# Patient Record
Sex: Female | Born: 1965 | Race: White | Hispanic: No | Marital: Married | Smoking: Never smoker
Health system: Southern US, Community
[De-identification: ages and names within clinical notes are randomized; demographics above are authoritative.]

## PROBLEM LIST (undated history)

## (undated) DIAGNOSIS — D649 Anemia, unspecified: Secondary | ICD-10-CM

## (undated) DIAGNOSIS — K219 Gastro-esophageal reflux disease without esophagitis: Secondary | ICD-10-CM

## (undated) DIAGNOSIS — I10 Essential (primary) hypertension: Secondary | ICD-10-CM

## (undated) DIAGNOSIS — E785 Hyperlipidemia, unspecified: Secondary | ICD-10-CM

## (undated) DIAGNOSIS — F419 Anxiety disorder, unspecified: Secondary | ICD-10-CM

## (undated) DIAGNOSIS — J45909 Unspecified asthma, uncomplicated: Secondary | ICD-10-CM

## (undated) DIAGNOSIS — Z9889 Other specified postprocedural states: Secondary | ICD-10-CM

## (undated) DIAGNOSIS — T7840XA Allergy, unspecified, initial encounter: Secondary | ICD-10-CM

## (undated) DIAGNOSIS — R112 Nausea with vomiting, unspecified: Secondary | ICD-10-CM

## (undated) HISTORY — PX: POLYPECTOMY: SHX149

## (undated) HISTORY — PX: TUBAL LIGATION: SHX77

## (undated) HISTORY — PX: BREAST BIOPSY: SHX20

## (undated) HISTORY — DX: Allergy, unspecified, initial encounter: T78.40XA

## (undated) HISTORY — DX: Hyperlipidemia, unspecified: E78.5

## (undated) HISTORY — PX: BREAST EXCISIONAL BIOPSY: SUR124

## (undated) HISTORY — DX: Anemia, unspecified: D64.9

## (undated) HISTORY — DX: Essential (primary) hypertension: I10

## (undated) HISTORY — DX: Anxiety disorder, unspecified: F41.9

## (undated) HISTORY — DX: Gastro-esophageal reflux disease without esophagitis: K21.9

---

## 2001-10-28 HISTORY — PX: NASAL STENOSIS REPAIR: SHX2071

## 2002-10-28 HISTORY — PX: BREAST SURGERY: SHX581

## 2005-06-25 ENCOUNTER — Ambulatory Visit: Payer: Self-pay | Admitting: Internal Medicine

## 2005-10-01 ENCOUNTER — Ambulatory Visit: Payer: Self-pay | Admitting: Family Medicine

## 2005-12-12 ENCOUNTER — Encounter: Payer: Self-pay | Admitting: Internal Medicine

## 2005-12-25 ENCOUNTER — Ambulatory Visit: Payer: Self-pay | Admitting: Internal Medicine

## 2006-02-20 ENCOUNTER — Ambulatory Visit: Payer: Self-pay | Admitting: Obstetrics & Gynecology

## 2006-02-27 ENCOUNTER — Ambulatory Visit: Payer: Self-pay | Admitting: Internal Medicine

## 2006-03-03 ENCOUNTER — Ambulatory Visit (HOSPITAL_COMMUNITY): Admission: RE | Admit: 2006-03-03 | Discharge: 2006-03-03 | Payer: Self-pay | Admitting: Gynecology

## 2006-03-13 ENCOUNTER — Ambulatory Visit: Payer: Self-pay | Admitting: Obstetrics & Gynecology

## 2006-05-26 ENCOUNTER — Ambulatory Visit: Payer: Self-pay | Admitting: Gynecology

## 2006-07-01 ENCOUNTER — Ambulatory Visit: Payer: Self-pay | Admitting: Internal Medicine

## 2006-07-28 ENCOUNTER — Ambulatory Visit: Payer: Self-pay | Admitting: Gynecology

## 2006-07-28 ENCOUNTER — Encounter (INDEPENDENT_AMBULATORY_CARE_PROVIDER_SITE_OTHER): Payer: Self-pay | Admitting: *Deleted

## 2006-07-28 ENCOUNTER — Ambulatory Visit (HOSPITAL_COMMUNITY): Admission: RE | Admit: 2006-07-28 | Discharge: 2006-07-29 | Payer: Self-pay | Admitting: Obstetrics & Gynecology

## 2006-07-28 HISTORY — PX: ABDOMINAL HYSTERECTOMY: SHX81

## 2006-09-01 ENCOUNTER — Ambulatory Visit: Payer: Self-pay | Admitting: Gynecology

## 2006-12-01 ENCOUNTER — Ambulatory Visit: Payer: Self-pay | Admitting: Family Medicine

## 2007-01-01 ENCOUNTER — Ambulatory Visit: Payer: Self-pay | Admitting: Internal Medicine

## 2007-01-01 LAB — CONVERTED CEMR LAB
ALT: 30 units/L (ref 0–40)
Albumin: 4.1 g/dL (ref 3.5–5.2)
BUN: 8 mg/dL (ref 6–23)
Basophils Absolute: 0 10*3/uL (ref 0.0–0.1)
Basophils Relative: 0.1 % (ref 0.0–1.0)
CO2: 29 meq/L (ref 19–32)
Calcium: 9.3 mg/dL (ref 8.4–10.5)
Chloride: 107 meq/L (ref 96–112)
Cholesterol: 189 mg/dL (ref 0–200)
Creatinine, Ser: 0.7 mg/dL (ref 0.4–1.2)
Eosinophils Absolute: 0.1 10*3/uL (ref 0.0–0.6)
Eosinophils Relative: 2.2 % (ref 0.0–5.0)
GFR calc Af Amer: 119 mL/min
GFR calc non Af Amer: 98 mL/min
Glucose, Bld: 85 mg/dL (ref 70–99)
HCT: 39 % (ref 36.0–46.0)
HDL: 46.8 mg/dL (ref >39.0)
Hemoglobin: 13.4 g/dL (ref 12.0–15.0)
LDL Cholesterol: 121 mg/dL — ABNORMAL HIGH (ref 0–99)
Lymphocytes Relative: 41.3 % (ref 12.0–46.0)
MCHC: 34.3 g/dL (ref 30.0–36.0)
MCV: 82.1 fL (ref 78.0–100.0)
Monocytes Absolute: 0.3 10*3/uL (ref 0.2–0.7)
Monocytes Relative: 5.4 % (ref 3.0–11.0)
Neutro Abs: 3.4 10*3/uL (ref 1.4–7.7)
Neutrophils Relative %: 51 % (ref 43.0–77.0)
Phosphorus: 3.5 mg/dL (ref 2.3–4.6)
Platelets: 211 10*3/uL (ref 150–400)
Potassium: 3.7 meq/L (ref 3.5–5.1)
RBC: 4.75 M/uL (ref 3.87–5.11)
RDW: 13 % (ref 11.5–14.6)
Sodium: 142 meq/L (ref 135–145)
TSH: 0.82 microintl units/mL (ref 0.35–5.50)
Total CHOL/HDL Ratio: 4
Triglycerides: 108 mg/dL (ref 0–149)
VLDL: 22 mg/dL (ref 0–40)
WBC: 6.4 10*3/uL (ref 4.5–10.5)

## 2007-01-14 ENCOUNTER — Emergency Department (HOSPITAL_COMMUNITY): Admission: EM | Admit: 2007-01-14 | Discharge: 2007-01-14 | Payer: Self-pay | Admitting: Emergency Medicine

## 2007-01-15 ENCOUNTER — Encounter: Payer: Self-pay | Admitting: Internal Medicine

## 2007-05-12 ENCOUNTER — Telehealth: Payer: Self-pay | Admitting: Internal Medicine

## 2007-05-13 ENCOUNTER — Ambulatory Visit: Payer: Self-pay | Admitting: Internal Medicine

## 2007-05-13 DIAGNOSIS — H6980 Other specified disorders of Eustachian tube, unspecified ear: Secondary | ICD-10-CM | POA: Insufficient documentation

## 2007-07-17 ENCOUNTER — Ambulatory Visit: Payer: Self-pay | Admitting: Family Medicine

## 2007-07-17 DIAGNOSIS — M6740 Ganglion, unspecified site: Secondary | ICD-10-CM | POA: Insufficient documentation

## 2007-08-12 ENCOUNTER — Encounter: Admission: RE | Admit: 2007-08-12 | Discharge: 2007-08-12 | Payer: Self-pay | Admitting: Obstetrics & Gynecology

## 2007-08-20 ENCOUNTER — Encounter: Admission: RE | Admit: 2007-08-20 | Discharge: 2007-08-20 | Payer: Self-pay | Admitting: Obstetrics & Gynecology

## 2007-09-02 ENCOUNTER — Encounter: Payer: Self-pay | Admitting: Internal Medicine

## 2007-09-10 ENCOUNTER — Ambulatory Visit: Payer: Self-pay | Admitting: Internal Medicine

## 2007-09-10 DIAGNOSIS — K219 Gastro-esophageal reflux disease without esophagitis: Secondary | ICD-10-CM | POA: Insufficient documentation

## 2007-09-10 DIAGNOSIS — E781 Pure hyperglyceridemia: Secondary | ICD-10-CM | POA: Insufficient documentation

## 2007-09-10 DIAGNOSIS — F419 Anxiety disorder, unspecified: Secondary | ICD-10-CM | POA: Insufficient documentation

## 2007-09-10 DIAGNOSIS — F411 Generalized anxiety disorder: Secondary | ICD-10-CM | POA: Insufficient documentation

## 2007-09-10 DIAGNOSIS — I10 Essential (primary) hypertension: Secondary | ICD-10-CM | POA: Insufficient documentation

## 2007-09-11 ENCOUNTER — Telehealth (INDEPENDENT_AMBULATORY_CARE_PROVIDER_SITE_OTHER): Payer: Self-pay | Admitting: *Deleted

## 2007-09-11 ENCOUNTER — Encounter (INDEPENDENT_AMBULATORY_CARE_PROVIDER_SITE_OTHER): Payer: Self-pay | Admitting: *Deleted

## 2007-11-19 ENCOUNTER — Ambulatory Visit: Payer: Self-pay | Admitting: Internal Medicine

## 2007-11-19 DIAGNOSIS — R002 Palpitations: Secondary | ICD-10-CM | POA: Insufficient documentation

## 2007-11-25 ENCOUNTER — Telehealth: Payer: Self-pay | Admitting: Internal Medicine

## 2008-01-12 ENCOUNTER — Telehealth: Payer: Self-pay | Admitting: Internal Medicine

## 2008-02-29 ENCOUNTER — Ambulatory Visit: Payer: Self-pay | Admitting: Family Medicine

## 2008-02-29 LAB — CONVERTED CEMR LAB: Rapid Strep: NEGATIVE

## 2008-03-07 ENCOUNTER — Telehealth (INDEPENDENT_AMBULATORY_CARE_PROVIDER_SITE_OTHER): Payer: Self-pay | Admitting: *Deleted

## 2008-03-08 ENCOUNTER — Ambulatory Visit: Payer: Self-pay | Admitting: Internal Medicine

## 2008-03-08 DIAGNOSIS — F438 Other reactions to severe stress: Secondary | ICD-10-CM | POA: Insufficient documentation

## 2008-03-08 DIAGNOSIS — F4389 Other reactions to severe stress: Secondary | ICD-10-CM | POA: Insufficient documentation

## 2008-05-07 ENCOUNTER — Ambulatory Visit: Payer: Self-pay | Admitting: Internal Medicine

## 2008-05-07 LAB — CONVERTED CEMR LAB: Rapid Strep: NEGATIVE

## 2008-08-10 ENCOUNTER — Telehealth (INDEPENDENT_AMBULATORY_CARE_PROVIDER_SITE_OTHER): Payer: Self-pay | Admitting: *Deleted

## 2008-08-15 ENCOUNTER — Ambulatory Visit (HOSPITAL_COMMUNITY): Admission: RE | Admit: 2008-08-15 | Discharge: 2008-08-15 | Payer: Self-pay | Admitting: Gynecology

## 2008-08-15 LAB — HM MAMMOGRAPHY

## 2008-09-14 ENCOUNTER — Ambulatory Visit: Payer: Self-pay | Admitting: Internal Medicine

## 2008-09-16 LAB — CONVERTED CEMR LAB
ALT: 48 units/L — ABNORMAL HIGH (ref 0–35)
AST: 29 units/L (ref 0–37)
Albumin: 4.1 g/dL (ref 3.5–5.2)
Alkaline Phosphatase: 49 units/L (ref 39–117)
BUN: 8 mg/dL (ref 6–23)
Basophils Absolute: 0 10*3/uL (ref 0.0–0.1)
Basophils Relative: 0.6 % (ref 0.0–3.0)
Bilirubin, Direct: 0.1 mg/dL (ref 0.0–0.3)
CO2: 29 meq/L (ref 19–32)
Calcium: 9.2 mg/dL (ref 8.4–10.5)
Chloride: 105 meq/L (ref 96–112)
Cholesterol: 206 mg/dL (ref 0–200)
Creatinine, Ser: 0.7 mg/dL (ref 0.4–1.2)
Direct LDL: 128.7 mg/dL
Eosinophils Absolute: 0.2 10*3/uL (ref 0.0–0.7)
Eosinophils Relative: 2.8 % (ref 0.0–5.0)
GFR calc Af Amer: 118 mL/min
GFR calc non Af Amer: 98 mL/min
Glucose, Bld: 86 mg/dL (ref 70–99)
HCT: 40.2 % (ref 36.0–46.0)
HDL: 40.8 mg/dL (ref >39.0)
Hemoglobin: 13.8 g/dL (ref 12.0–15.0)
Lymphocytes Relative: 24.4 % (ref 12.0–46.0)
MCHC: 34.4 g/dL (ref 30.0–36.0)
MCV: 82 fL (ref 78.0–100.0)
Monocytes Absolute: 0.4 10*3/uL (ref 0.1–1.0)
Monocytes Relative: 5.3 % (ref 3.0–12.0)
Neutro Abs: 5.1 10*3/uL (ref 1.4–7.7)
Neutrophils Relative %: 66.9 % (ref 43.0–77.0)
Phosphorus: 3.4 mg/dL (ref 2.3–4.6)
Platelets: 171 10*3/uL (ref 150–400)
Potassium: 3.9 meq/L (ref 3.5–5.1)
RBC: 4.9 M/uL (ref 3.87–5.11)
RDW: 12.7 % (ref 11.5–14.6)
Sodium: 139 meq/L (ref 135–145)
TSH: 1.41 microintl units/mL (ref 0.35–5.50)
Total Bilirubin: 0.7 mg/dL (ref 0.3–1.2)
Total CHOL/HDL Ratio: 5
Total Protein: 7.3 g/dL (ref 6.0–8.3)
Triglycerides: 143 mg/dL (ref 0–149)
VLDL: 29 mg/dL (ref 0–40)
WBC: 7.6 10*3/uL (ref 4.5–10.5)

## 2008-09-27 ENCOUNTER — Ambulatory Visit: Payer: Self-pay | Admitting: Family Medicine

## 2008-10-28 HISTORY — PX: COLONOSCOPY: SHX174

## 2008-11-02 ENCOUNTER — Ambulatory Visit: Payer: Self-pay | Admitting: Family Medicine

## 2008-11-02 DIAGNOSIS — R059 Cough, unspecified: Secondary | ICD-10-CM | POA: Insufficient documentation

## 2008-11-02 DIAGNOSIS — R05 Cough: Secondary | ICD-10-CM

## 2008-12-08 ENCOUNTER — Encounter (INDEPENDENT_AMBULATORY_CARE_PROVIDER_SITE_OTHER): Payer: Self-pay | Admitting: *Deleted

## 2008-12-21 ENCOUNTER — Telehealth: Payer: Self-pay | Admitting: Internal Medicine

## 2009-01-26 HISTORY — PX: CARPAL TUNNEL RELEASE: SHX101

## 2009-03-22 ENCOUNTER — Ambulatory Visit: Payer: Self-pay | Admitting: Internal Medicine

## 2009-03-23 LAB — CONVERTED CEMR LAB
ALT: 38 units/L — ABNORMAL HIGH (ref 0–35)
AST: 29 units/L (ref 0–37)
Albumin: 4.1 g/dL (ref 3.5–5.2)
Alkaline Phosphatase: 53 units/L (ref 39–117)
BUN: 12 mg/dL (ref 6–23)
Basophils Absolute: 0 10*3/uL (ref 0.0–0.1)
Basophils Relative: 0.4 % (ref 0.0–3.0)
Bilirubin, Direct: 0 mg/dL (ref 0.0–0.3)
CO2: 29 meq/L (ref 19–32)
Calcium: 9.1 mg/dL (ref 8.4–10.5)
Chloride: 108 meq/L (ref 96–112)
Cholesterol: 183 mg/dL (ref 0–200)
Creatinine, Ser: 0.7 mg/dL (ref 0.4–1.2)
Eosinophils Absolute: 0.2 10*3/uL (ref 0.0–0.7)
Eosinophils Relative: 2.3 % (ref 0.0–5.0)
Glucose, Bld: 93 mg/dL (ref 70–99)
HCT: 39 % (ref 36.0–46.0)
HDL: 40.4 mg/dL (ref >39.00)
Hemoglobin: 13.7 g/dL (ref 12.0–15.0)
LDL Cholesterol: 116 mg/dL — ABNORMAL HIGH (ref 0–99)
Lymphocytes Relative: 33.2 % (ref 12.0–46.0)
Lymphs Abs: 2.5 10*3/uL (ref 0.7–4.0)
MCHC: 35.1 g/dL (ref 30.0–36.0)
MCV: 81.3 fL (ref 78.0–100.0)
Monocytes Absolute: 0.4 10*3/uL (ref 0.1–1.0)
Monocytes Relative: 4.8 % (ref 3.0–12.0)
Neutro Abs: 4.4 10*3/uL (ref 1.4–7.7)
Neutrophils Relative %: 59.3 % (ref 43.0–77.0)
Phosphorus: 3.6 mg/dL (ref 2.3–4.6)
Platelets: 183 10*3/uL (ref 150.0–400.0)
Potassium: 3.8 meq/L (ref 3.5–5.1)
RBC: 4.79 M/uL (ref 3.87–5.11)
RDW: 12.9 % (ref 11.5–14.6)
Sodium: 140 meq/L (ref 135–145)
TSH: 2.03 microintl units/mL (ref 0.35–5.50)
Total Bilirubin: 0.6 mg/dL (ref 0.3–1.2)
Total CHOL/HDL Ratio: 5
Total Protein: 7.2 g/dL (ref 6.0–8.3)
Triglycerides: 135 mg/dL (ref 0.0–149.0)
VLDL: 27 mg/dL (ref 0.0–40.0)
WBC: 7.5 10*3/uL (ref 4.5–10.5)

## 2009-04-24 ENCOUNTER — Encounter: Payer: Self-pay | Admitting: Internal Medicine

## 2009-06-27 ENCOUNTER — Ambulatory Visit: Payer: Self-pay | Admitting: Family Medicine

## 2009-06-27 DIAGNOSIS — IMO0002 Reserved for concepts with insufficient information to code with codable children: Secondary | ICD-10-CM | POA: Insufficient documentation

## 2009-09-13 ENCOUNTER — Ambulatory Visit (HOSPITAL_COMMUNITY): Admission: RE | Admit: 2009-09-13 | Discharge: 2009-09-13 | Payer: Self-pay | Admitting: Obstetrics & Gynecology

## 2009-09-26 ENCOUNTER — Ambulatory Visit: Payer: Self-pay | Admitting: Internal Medicine

## 2009-09-28 LAB — CONVERTED CEMR LAB
ALT: 63 units/L — ABNORMAL HIGH (ref 0–35)
AST: 46 units/L — ABNORMAL HIGH (ref 0–37)
Albumin: 4 g/dL (ref 3.5–5.2)
Alkaline Phosphatase: 64 units/L (ref 39–117)
BUN: 5 mg/dL — ABNORMAL LOW (ref 6–23)
Basophils Absolute: 0 10*3/uL (ref 0.0–0.1)
Basophils Relative: 0.5 % (ref 0.0–3.0)
Bilirubin, Direct: 0.2 mg/dL (ref 0.0–0.3)
CO2: 29 meq/L (ref 19–32)
Calcium: 9.1 mg/dL (ref 8.4–10.5)
Chloride: 105 meq/L (ref 96–112)
Cholesterol: 176 mg/dL (ref 0–200)
Creatinine, Ser: 0.6 mg/dL (ref 0.4–1.2)
Eosinophils Absolute: 0.1 10*3/uL (ref 0.0–0.7)
Eosinophils Relative: 2.1 % (ref 0.0–5.0)
GFR calc non Af Amer: 115.48 mL/min (ref >60)
Glucose, Bld: 93 mg/dL (ref 70–99)
HCT: 41 % (ref 36.0–46.0)
HDL: 32.3 mg/dL — ABNORMAL LOW (ref >39.00)
Hemoglobin: 13.7 g/dL (ref 12.0–15.0)
LDL Cholesterol: 114 mg/dL — ABNORMAL HIGH (ref 0–99)
Lymphocytes Relative: 33.3 % (ref 12.0–46.0)
Lymphs Abs: 2.2 10*3/uL (ref 0.7–4.0)
MCHC: 33.4 g/dL (ref 30.0–36.0)
MCV: 84.4 fL (ref 78.0–100.0)
Monocytes Absolute: 0.5 10*3/uL (ref 0.1–1.0)
Monocytes Relative: 7.3 % (ref 3.0–12.0)
Neutro Abs: 3.8 10*3/uL (ref 1.4–7.7)
Neutrophils Relative %: 56.8 % (ref 43.0–77.0)
Phosphorus: 3.1 mg/dL (ref 2.3–4.6)
Platelets: 177 10*3/uL (ref 150.0–400.0)
Potassium: 4.1 meq/L (ref 3.5–5.1)
RBC: 4.85 M/uL (ref 3.87–5.11)
RDW: 12.5 % (ref 11.5–14.6)
Sodium: 140 meq/L (ref 135–145)
Total Bilirubin: 0.8 mg/dL (ref 0.3–1.2)
Total CHOL/HDL Ratio: 5
Total Protein: 7.1 g/dL (ref 6.0–8.3)
Triglycerides: 147 mg/dL (ref 0.0–149.0)
VLDL: 29.4 mg/dL (ref 0.0–40.0)
WBC: 6.6 10*3/uL (ref 4.5–10.5)

## 2009-10-08 ENCOUNTER — Emergency Department (HOSPITAL_COMMUNITY): Admission: EM | Admit: 2009-10-08 | Discharge: 2009-10-08 | Payer: Self-pay | Admitting: Family Medicine

## 2009-10-08 ENCOUNTER — Telehealth: Payer: Self-pay | Admitting: Family Medicine

## 2010-06-27 ENCOUNTER — Telehealth: Payer: Self-pay | Admitting: Internal Medicine

## 2010-08-23 LAB — HM COLONOSCOPY

## 2010-08-29 ENCOUNTER — Encounter: Payer: Self-pay | Admitting: Internal Medicine

## 2010-10-10 ENCOUNTER — Ambulatory Visit: Payer: Self-pay | Admitting: Internal Medicine

## 2010-10-10 ENCOUNTER — Encounter (INDEPENDENT_AMBULATORY_CARE_PROVIDER_SITE_OTHER): Payer: Self-pay | Admitting: *Deleted

## 2010-10-11 ENCOUNTER — Telehealth: Payer: Self-pay | Admitting: Family Medicine

## 2010-10-11 ENCOUNTER — Encounter: Payer: Self-pay | Admitting: Family Medicine

## 2010-10-11 ENCOUNTER — Encounter (INDEPENDENT_AMBULATORY_CARE_PROVIDER_SITE_OTHER): Payer: Self-pay | Admitting: *Deleted

## 2010-10-12 ENCOUNTER — Telehealth: Payer: Self-pay | Admitting: Family Medicine

## 2010-10-16 ENCOUNTER — Telehealth: Payer: Self-pay | Admitting: Internal Medicine

## 2010-11-18 ENCOUNTER — Encounter: Payer: Self-pay | Admitting: Obstetrics & Gynecology

## 2010-11-27 NOTE — Consult Note (Signed)
Summary: [facility]  [facility]   Imported By: Lanelle Bal 09/10/2010 11:43:00  _____________________________________________________________________  External Attachment:    Type:   Image     Comment:   External Document  Appended Document: [facility]     Clinical Lists Changes  Observations: Added new observation of EGD: Barrett's esophagus Plan follow up in 6 months (08/23/2010 14:15) Added new observation of COLONOSCOPY: No polyps recall in 5 years due to history of polyps (08/23/2010 14:14)       Colonoscopy  Procedure date:  08/23/2010  Findings:      No polyps recall in 5 years due to history of polyps  EGD  Procedure date:  08/23/2010  Findings:      Barrett's esophagus Plan follow up in 6 months
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
## 2010-11-27 NOTE — Assessment & Plan Note (Signed)
Summary: LUMP ON HAND/CLE    Vital Signs:  Patient Profile:   45 Years Old Female Weight:      169 pounds Temp:     98.3 degrees F oral Pulse rate:   81 / minute BP sitting:   158 / 87  (left arm) Cuff size:   regular  Vitals Entered By: Cooper Render (July 17, 2007 9:19 AM)                 Chief Complaint:  lump ojn L) hand and hurts.  History of Present Illness: tender area on left hand, feels lump on palm below 2 and 3rd digit. No redness, slight swelling tender to grip left hand, and open No hx of similar.   Current Allergies (reviewed today): ! LIPITOR (ATORVASTATIN CALCIUM) ! * SHELL FISH * PENICILLINS GROUP CEFZIL (CEFPROZIL)     Review of Systems  The patient denies fever and weight loss.     Physical Exam  General:     Well-developed,well-nourished,in no acute distress; alert,appropriate and cooperative throughout examination Msk:     no triggering of fingers, no DIP,PIP, MCP joint synovitis or tenderness 1 cm palpable mass mobile with extension and flexion of 3rd digit, appears connected to flexor tendon Pulses:     R and L carotid,radial,femoral,dorsalis pedis and posterior tibial pulses are full and equal bilaterally Neurologic:     No cranial nerve deficits noted. Station and gait are normal. Sensory, motor and coordinative functions appear intact.    Impression & Recommendations:  Problem # 1:  GANGLION, TENDON SHEATH (ICD-727.42) left 3rd flexor tendon sheath.  Ice, NSAIDs, gentle strtching.  Referral to hand surgeon if no improvement.  Complete Medication List: 1)  Nexium 40 Mg Cpdr (Esomeprazole magnesium) .... Take 1 tablet by mouth once a day prn 2)  Lescol Xl 80 Mg Tb24 (Fluvastatin sodium) .... Take 1 tablet by mouth once a day 3)  Toprol Xl 50 Mg Tb24 (Metoprolol succinate) .... Take 1 tablet by mouth once a day   Patient Instructions: 1)  Ibuprofen 800 m every 8 hours. 2)  Ice, gentle stretching. 3)  Callif not  improving in 10-14 days.    ]

## 2010-11-27 NOTE — Assessment & Plan Note (Signed)
Summary: SORE THROAT AND EAR PAIN/CDJ   Vital Signs:  Patient Profile:   45 Years Old Female Temp:     97.5 degrees F oral Pulse rate:   79 / minute BP sitting:   139 / 88  (left arm)  Vitals Entered By: Doristine Devoid (May 07, 2008 12:00 PM)                 PCP:  Alphonsus Sias  Chief Complaint:  left ear pain and sore throat along with headaches.  History of Present Illness: Yvonne Stephenson comes to saturday clinic for ST  HA and congestion and hoarseness and now left ear pain increasing ove 1 day   hurts to swallow .  ? no fever.  Had recent dental work  on r side of mouth.  NO strep exposure.   Gets sinus infections.  No tobacco or asthma .     Allergic to many antibiotic s  can take biaxin but not pcn azithro  cefzil.        Current Allergies: ! LIPITOR (ATORVASTATIN CALCIUM) ! * SHELL FISH ! * ZPAK * PENICILLINS GROUP CEFZIL  Past Medical History:    Anxiety    GERD    Hyperlipidemia    Hypertension          Review of Systems  The patient denies anorexia, fever, chest pain, syncope, dyspnea on exertion, prolonged cough, and hemoptysis.     Physical Exam  General:     alert, well-developed, and well-nourished.  hoarse and congested and uncomfortable Head:     normocephalic and atraumatic.   Eyes:     no redness or discharge Ears:     left tm fluid  no redness eac ok Nose:     very congested  Mouth:     2+ red no exudate or ulcers  + cobblestoning Neck:     supple and full ROM.  tender adenopathy r  Lungs:     Normal respiratory effort, chest expands symmetrically. Lungs are clear to auscultation, no crackles or wheezes.no dullness.   Heart:     Normal rate and regular rhythm. S1 and S2 normal without gallop, murmur, click, rub or other extra sounds. Skin:     turgor normal, color normal, and no ecchymoses.   Cervical Nodes:     L anterior LN tender.  1-2+  rest shoddy Psych:     Oriented X3 and normally interactive.      Impression &  Recommendations:  Problem # 1:  EAR PAIN, LEFT (ICD-388.70) ? serous vs acute om   with eustachian tube dysfunction and poss referred pain  from LN swelling also  ibuprofen for pain as needed  Her updated medication list for this problem includes:    Biaxin Xl 500 Mg Tb24 (Clarithromycin) .[name]... 2 tabs daily for 5 days  Orders: Rapid Strep (63016)   Problem # 2:  RESPIRATORY DISORDER, ACUTE (ICD-465.9) see above   symptom rx    Orders: Rapid Strep (01093)   Complete Medication List: 1)  Lescol Xl 80 Mg Tb24 (Fluvastatin sodium) .... Take 1 tablet by mouth once a day 2)  Toprol Xl 50 Mg Tb24 (Metoprolol succinate) .... Take 1 tablet by mouth once a day 3)  Prilosec Otc 20 Mg Tbec (Omeprazole magnesium) .... Take one by mouth once a day as needed 4)  Theraflu  .... As needed 5)  Flonase 50 Mcg/act Susp (Fluticasone propionate) .... 2 sprays in each nostril once a day. 6)  Bisoprolol Fumarate 5 Mg Tabs (Bisoprolol fumarate) .... Take 1 tablet by mouth once a day 7)  Biaxin Xl 500 Mg Tb24 (Clarithromycin) .... 2 tabs daily for 5 days    Prescriptions: BIAXIN XL 500 MG  TB24 (CLARITHROMYCIN) 2 tabs daily for 5 days  #10 x 0   Entered and Authorized by:   Madelin Headings MD   Signed by:   Madelin Headings MD on 05/07/2008   Method used:   [facility] ...       [facility]  [address] [phone][facility]       [address]       [place], [place]  [address]       Ph: [phone]       Fax: [phone]   RxID:   [id number]  ] Laboratory Results    Other Tests  Rapid Strep: negative

## 2010-11-27 NOTE — Progress Notes (Signed)
  Phone Note Call from Patient   Caller: Patient Summary of Call: Started to have a stye on thursday and has been using warm compresses. Woke up this morning and now eye is swollen up to her eyebrow and can barely open eye. Rec. eval this morning at [facility] to eval for preorbital vs orbital cellulitis and she agreed. Initial call taken by: Hannah Beat MD,  October 08, 2009 8:13 AM

## 2010-11-27 NOTE — Letter (Signed)
Summary: [facility]  [facility]   Imported By: Lanelle Bal 05/12/2009 11:56:18  _____________________________________________________________________  External Attachment:    Type:   Image     Comment:   External Document  Appended Document: [facility]     Clinical Lists Changes  Observations: Added new observation of COLONOSCOPY: 1 polyp recall 1 year  Dr Noe Gens in [facility] (04/17/2009 13:52)       Colonoscopy  Procedure date:  04/17/2009  Findings:      1 polyp recall 1 year  Dr Noe Gens in [facility]  Appended Document: [facility] EGD done also--esophageal ulcer plans repeat in 1 month and 1 year

## 2010-11-27 NOTE — Miscellaneous (Signed)
Summary: Orders Update  Medications Added DOXY-CAPS 100 MG  CAPS (DOXYCYCLINE HYCLATE) Take 1 tab twice a day x 10 days.       Clinical Lists Changes  Medications: Removed medication of ZITHROMAX Z-PAK 250 MG  TABS (AZITHROMYCIN) Take 2 tabs today and then starting tomorrow, take 1 tab every day x 4. Added new medication of DOXY-CAPS 100 MG  CAPS (DOXYCYCLINE HYCLATE) Take 1 tab twice a day x 10 days. - Signed Rx of DOXY-CAPS 100 MG  CAPS (DOXYCYCLINE HYCLATE) Take 1 tab twice a day x 10 days.;  #20 x 0;  Signed;  Entered by: Kathie Rhodes NP;  Authorized by: Kathie Rhodes NP;  Method used: Print then Give to Patient    Prescriptions: DOXY-CAPS 100 MG  CAPS (DOXYCYCLINE HYCLATE) Take 1 tab twice a day x 10 days.  #20 x 0   Entered and Authorized by:   Kathie Rhodes NP   Signed by:   Kathie Rhodes NP on 09/11/2007   Method used:   Print then Give to Patient   RxID:   [id number]

## 2010-11-27 NOTE — Assessment & Plan Note (Signed)
Summary: COUGH/CLE   Vital Signs:  Patient Profile:   45 Years Old Female Weight:      186 pounds O2 Sat:      97 % O2 treatment:    Room Air Temp:     98.4 degrees F oral Pulse rate:   70 / minute Pulse rhythm:   regular BP sitting:   118 / 72  (left arm) Cuff size:   regular  Vitals Entered By: Mervin Hack CMA (November 02, 2008 11:53 AM)                 Chief Complaint:  Cough.  Acute Visit History:      The patient complains of cough.  These symptoms began cough x 2 months, worse in last 4 days ago.  She denies earache, fever, headache, nasal discharge, sinus problems, and sore throat.  Other comments include: chest tight tried tessalon perles and albuterol helped a little but not much.        The patient notes wheezing and shortness of breath.  The character of the cough is described as nonproductive.         Hypertension History:      Positive major cardiovascular risk factors include hyperlipidemia and hypertension.  Negative major cardiovascular risk factors include female age less than 59 years old and non-tobacco-user status.        Current Allergies (reviewed today): ! LIPITOR (ATORVASTATIN CALCIUM) ! * SHELL FISH * PENICILLINS GROUP CEFZIL AZITHROMYCIN (AZITHROMYCIN)  Past Medical History:    Reviewed history from 05/07/2008 and no changes required:       Anxiety       GERD       Hyperlipidemia       Hypertension           Social History:    Reviewed history from 09/14/2008 and no changes required:       Occupation:Homemaker       3 children, 15,13 & 7       Married       Gardens       Does walk a little for exercise       Never Smoked       Alcohol use-occ    Review of Systems       no heartburn, no allergy symptoms.   General      Denies fever.  ENT      Denies nasal congestion, postnasal drainage, and sinus pressure.  GI      Denies abdominal pain.  GU      Denies discharge.  Neuro      no dysphagia   Physical Exam   General:     Well-developed,well-nourished,in no acute distress; alert,appropriate and cooperative throughout examination Ears:     External ear exam shows no significant lesions or deformities.  Otoscopic examination reveals clear canals, tympanic membranes are intact bilaterally without bulging, retraction, inflammation or discharge. Hearing is grossly normal bilaterally. Nose:     External nasal examination shows no deformity or inflammation. Nasal mucosa are pink and moist without lesions or exudates. Mouth:     Oral mucosa and oropharynx without lesions or exudates.  Teeth in good repair. Neck:     no cervical or supraclavicular lymphadenopathy  Lungs:     Normal respiratory effort, chest expands symmetrically. Lungs are clear to auscultation, no crackles or wheezes. Heart:     Normal rate and regular rhythm. S1 and S2 normal without gallop, murmur, click, rub  or other extra sounds.    Impression & Recommendations:  Problem # 1:  COUGH (ICD-786.2) No sign of infection or other abnormlity of CXR.  Given recent viral infection, i feel post inflammatory bronchospasm is most likely. No s/s of GERD or allergies at this point. Will treat with inhaler and prednisone taper. Call if fever or not improving. No antibiotic needed. Able to still use tessalon perles.  Orders: CXR- 2view (CXR)   Complete Medication List: 1)  Lescol Xl 80 Mg Tb24 (Fluvastatin sodium) .... Take 1 tablet by mouth once a day 2)  Toprol Xl 50 Mg Tb24 (Metoprolol succinate) .... Take 1 tablet by mouth once a day 3)  Prilosec Otc 20 Mg Tbec (Omeprazole magnesium) .... Take one by mouth once a day as needed 4)  Flonase 50 Mcg/act Susp (Fluticasone propionate) .... 2 sprays in each nostril once a day. 5)  Bisoprolol Fumarate 5 Mg Tabs (Bisoprolol fumarate) .... Take 1 tablet by mouth once a day 6)  Multi-symptom Cold Relief 30-2-10-325 Mg Tabs (Pseudoeph-cpm-dm-apap) .... Take 1-2 by mouth every 6 hours as needed 7)   Hydrocodone-homatropine 5-1.5 Mg/58ml Syrp (Hydrocodone-homatropine) .[name].. 1-2 teaspoons at bedtime as needed for severe cough 8)  Proventil Hfa 108 (90 Base) Mcg/act Aers (Albuterol sulfate) .... 2 puffs up to every 4 hours as needed wheezing/ tight chest 9)  Tessalon 200 Mg Caps (Benzonatate) .[name].. 1 by mouth up to three times a day as needed cough (swallow whole) 10)  Prednisone 20 Mg Tabs (Prednisone) .... 3 tabs by mouth daily x 3 days, then 2 tabs by mouth daily x 2 days then 1 tab by mouth daily x 2 days  Hypertension Assessment/Plan:      The patient's hypertensive risk group is category B: At least one risk factor (excluding diabetes) with no target organ damage.  Her calculated 10 year risk of coronary heart disease is 2 %.  Today's blood pressure is 118/72.  Her blood pressure goal is < 140/90.    Prescriptions: PREDNISONE 20 MG TABS (PREDNISONE) 3 tabs by mouth daily x 3 days, then 2 tabs by mouth daily x 2 days then 1 tab by mouth daily x 2 days  #15 x 0   Entered and Authorized by:   Kerby Nora MD   Signed by:   Kerby Nora MD on 11/02/2008   Method used:   [facility]        [facility]  [address] [facility]ail)       [address]       [place], [place]  [address]       Ph: [phone] or [phone]       Fax: [phone]   RxID:   [id number]  ] Current Allergies (reviewed today): ! LIPITOR (ATORVASTATIN CALCIUM) ! * SHELL FISH * PENICILLINS GROUP CEFZIL AZITHROMYCIN (AZITHROMYCIN)

## 2010-11-27 NOTE — Progress Notes (Signed)
Summary: wants to change from lescol  Phone Note From Pharmacy   [facility]  [id number] Summary of Call: Pharmacy is asking to change lescol to something less costly.  This will cost pt $127. and pt is also asking to change. Initial call taken by: Lowella Petties CMA,  June 27, 2010 12:22 PM  Follow-up for Phone Call        okay to have her try pravastatin 40mg  daily send 1 year Rx and set her up for lipid and hepatic profiles in about 4-6 weeks Follow-up by: Cindee Salt MD,  June 27, 2010 1:58 PM  Additional Follow-up for Phone Call Additional follow up Details #1::        called both cell and home number, work number was busy twice(it's [employer]) left message that [facility] and that pt needs to schedule labs 4-6 weeks after starting med. DeShannon Smith CMA (AAMA)  June 27, 2010 3:24 PM     New/Updated Medications: PRAVASTATIN SODIUM 40 MG TABS (PRAVASTATIN SODIUM) take 1 by mouth once daily Prescriptions: PRAVASTATIN SODIUM 40 MG TABS (PRAVASTATIN SODIUM) take 1 by mouth once daily  #30 x 12   Entered by:   Mervin Hack CMA (AAMA)   Authorized by:   Cindee Salt MD   Signed by:   Mervin Hack CMA (AAMA) on 06/27/2010   Method used:   [facility]        [facility]  [address] (retail)       [address]       [place], [place]  [address]       Ph: [phone] or [phone]       Fax: [phone]   RxID:   [id number]
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
## 2010-11-27 NOTE — Progress Notes (Signed)
Summary: rx bisporolol  Medications Added BISOPROLOL FUMARATE 5 MG  TABS (BISOPROLOL FUMARATE) Take 1 tablet by mouth once a day       [facility] Note [facility]   [facility] to    Method Requested: Fax to Local Pharmacy Initial call taken by: Wandra Mannan,  January 12, 2008 10:37 AM  Follow-up for Phone Call        change to bisoprolol 5mg  daily Okay 1 year supply Follow-up by: Cindee Salt MD,  January 12, 2008 1:15 PM    New/Updated Medications: BISOPROLOL FUMARATE 5 MG  TABS (BISOPROLOL FUMARATE) Take 1 tablet by mouth once a day   Prescriptions: BISOPROLOL FUMARATE 5 MG  TABS (BISOPROLOL FUMARATE) Take 1 tablet by mouth once a day  #30 x 12   Entered by:   Wandra Mannan   Authorized by:   Cindee Salt MD   Signed by:   Wandra Mannan on 01/12/2008   Method used:   Elect[facility] ...       [facility]  [address]  [id number]       [address]       [place], [place]  [address]       Ph: [phone] or [phone]       Fax: [phone]   RxID:   [id number]
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
## 2010-11-27 NOTE — Progress Notes (Signed)
Summary: fyi      Allergies Added: * PENICILLINS GROUP CEFZIL (CEFPROZIL) Phone Note Call from Patient Call back at [phone]   Caller: sister- laura Call For: Selma Rodelo Summary of Call: pt is at dentist right now she is c/o a bad earache wants appt today Initial call taken by: Liane Comber,  May 12, 2007 3:52 PM  Follow-up for Phone Call        Ii spoke with sister pt is still at dentist, I advised urgent care tonight or if she would rather come here, 8:15 tomorrow with you. she will tome tomorrow am. ..................................................................Marland KitchenLiane Comber  May 12, 2007 4:03 PM   Additional Follow-up for Phone Call Additional follow up Details #1::        okay Additional Follow-up by: Cindee Salt MD,  May 13, 2007 7:55 AM   New Allergies: * PENICILLINS GROUP CEFZIL (CEFPROZIL) New Allergies: * PENICILLINS GROUP CEFZIL (CEFPROZIL)

## 2010-11-27 NOTE — Letter (Signed)
Summary: Primary Care Appointment Letter  [facility]  [address] [place], [place] [address]   Phone: [phone]  Fax: [phone]    12/08/2008 MRN: [id number]  ITZEL MCKIBBIN [address] [place], [place]  [address]  Dear Ms. Carma Leaven,   Your Primary Care Physician Cindee Salt MD has indicated that:    _______it is time to schedule an appointment.    _______you missed your appointment on______ and need to call and          reschedule.    _______you need to have lab work done.    _______you need to schedule an appointment discuss lab or test results.    _______you need to call to reschedule your appointment that is                       scheduled on _________.     Please call our office as soon as possible. Our phone number is [phone]          _________. Please press option 1. Our office is open 8a-12noon and 1p-5p, Monday through Friday.     Thank you,    [place] [facility] Scheduler
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
## 2010-11-27 NOTE — Progress Notes (Signed)
Summary: Sinus problems  Medications Added CLEOCIN 300 MG  CAPS (CLINDAMYCIN HCL) 1 three times a day       Phone Note Call from Patient Call back at [phone]   Caller: Patient Call For: Dr. Alphonsus Sias Summary of Call: Was in last Thursday and now having sinus problems. Alot of sinus pressure, post nasal drip, nose is bloody when she blows it in the mornings, no fever.  Would you call her something in for this?   [facility] Initial call taken by: Sydell Axon,  November 25, 2007 1:53 PM  Follow-up for Phone Call        antibiotic sent electronically Please let her know Follow-up by: Cindee Salt MD,  November 25, 2007 2:07 PM  Additional Follow-up for Phone Call Additional follow up Details #1::        Pt. notified as instructed.  Pt will check with pharmacy and make sure that she is not allergic to this medication before getting it. Additional Follow-up by: Sydell Axon,  November 25, 2007 2:32 PM    New/Updated Medications: CLEOCIN 300 MG  CAPS (CLINDAMYCIN HCL) 1 three times a day   Prescriptions: CLEOCIN 300 MG  CAPS (CLINDAMYCIN HCL) 1 three times a day  #30 x 0   Entered and Authorized by:   Cindee Salt MD   Signed by:   Cindee Salt MD on 11/25/2007   Method used:   [facility] to ...       [facility]  [address]  [id number]       [address]       [place], [place]  [address]       Ph: [phone] or [phone]       Fax: [phone]   RxID:   [id number]

## 2010-11-29 NOTE — Progress Notes (Signed)
Summary: not any better  Phone Note Call from Patient Call back at Home Phone [phone]   Caller: Patient Summary of Call: Pt was seen on wednesday and states she is no better, she asks if abx can be [facility] to [facility].  She still has the same sxs as when she was here but she says she is worse. Initial call taken by: Lowella Petties CMA, AAMA,  October 12, 2010 4:48 PM  Follow-up for Phone Call        called patient, not bette.  sent in bactrim ds x 10 days for sinusitis.   Follow-up by: Eustaquio Boyden  MD,  October 12, 2010 5:19 PM    New/Updated Medications: BACTRIM DS 800-160 MG TABS (SULFAMETHOXAZOLE-TRIMETHOPRIM) take one twice daily for 10 days Prescriptions: BACTRIM DS 800-160 MG TABS (SULFAMETHOXAZOLE-TRIMETHOPRIM) take one twice daily for 10 days  #20 x 0   Entered and Authorized by:   Eustaquio Boyden  MD   Signed by:   Eustaquio Boyden  MD on 10/12/2010   Method used:   [facility]ronically to        [facility]  [address] (retail)       [address]       [place], [place]  [address]       Ph: [phone] or [phone]       Fax: [phone]   RxID:   [id number]

## 2010-11-29 NOTE — Assessment & Plan Note (Signed)
Summary: COUGH,CONGESTION,FEVER/CLE   Vital Signs:  Patient profile:   45 year old female Weight:      188.25 pounds BMI:     31.44 Temp:     98.4 degrees F oral Pulse rate:   84 / minute Pulse rhythm:   regular BP sitting:   132 / 82  (left arm) Cuff size:   large  Vitals Entered By: Selena Batten Dance CMA Duncan Dull) (October 10, 2010 11:18 AM) CC: Cough/congestion/ear pain   History of Present Illness: CC: congestion  4d h/o dry coughing, sneezing.  progressed to sinus congestion, + chills last night.  + arthralgias (knees).  + L ear feels clogged.  Clear discharge out of nose.  Still dry cough.  +sinus pressure.  Taking tylenol which helps some.  cough not letting her sleep at night.  No fever, ST, abd pain, n/v/d, rashes.  No tooth pain  Working more hours recently.  No smokers at home.  No asthma/allergies.  h/o bronchial asthma when sick.  + sick contacts at work around her.  Current Medications (verified): 1)  Pravastatin Sodium 40 Mg Tabs (Pravastatin Sodium) .... Take 1 By Mouth Once Daily 2)  Bisoprolol Fumarate 5 Mg  Tabs (Bisoprolol Fumarate) .... Take 1 Tablet By Mouth Once A Day 3)  Proventil Hfa 108 (90 Base) Mcg/act Aers (Albuterol Sulfate) .... 2 Puffs Up To Every 4 Hours As Needed Wheezing/ Tight Chest 4)  Zegerid 40-1100 Mg Caps (Omeprazole-Sodium Bicarbonate) .... Take 2 By Mouth Once Daily 5)  Tylenol 325 Mg Tabs (Acetaminophen)  Allergies: 1)  ! Lipitor (Atorvastatin Calcium) 2)  ! 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    congested, tired. Head:  Normocephalic and atraumatic without obvious abnormalities. No apparent alopecia or balding.  + mild maxillary tenderness bilaterally Eyes:  pupils equal, pupils round, pupils reactive to light Ears:  + TMs with fluid bilaterally Nose:  injected nares Mouth:  no erythema and no lesions.  MMM Neck:  supple, no masses, no thyromegaly, no carotid bruits, and no cervical lymphadenopathy.   Lungs:  normal respiratory effort and normal breath sounds.   Heart:  normal rate, regular rhythm, no murmur, and no gallop.   Pulses:  1+ rad pulses Extremities:  no edema   Impression & Recommendations:  Problem # 1:  SINUSITIS, ACUTE (ICD-461.9) x 4 days, early, likely viral.  treat supportively as per pt instructions.  to call if not better by Friday for possible work excuse.  (off today and tomorrow).  red flags to return discussed.  Her updated medication list for this problem includes:    Cheratussin Ac 100-10 Mg/90ml Syrp (Guaifenesin-codeine) ..... One teaspoon q6 hours as needed cough, sedation precautions  Complete Medication List: 1)  Pravastatin Sodium 40 Mg Tabs (Pravastatin sodium) .... Take 1 by mouth once daily 2)  Bisoprolol Fumarate 5 Mg Tabs (Bisoprolol fumarate) .... Take 1 tablet by mouth once a day 3)  Proventil Hfa 108 (90 Base) Mcg/act Aers (Albuterol sulfate) .... 2 puffs up to every 4 hours as needed  wheezing/ tight chest 4)  Zegerid 40-1100 Mg Caps (Omeprazole-sodium bicarbonate) .... Take 2 by mouth once daily 5)  Tylenol 325 Mg Tabs (Acetaminophen) 6)  Cheratussin Ac 100-10 Mg/103ml Syrp (Guaifenesin-codeine) .... One teaspoon q6 hours as needed cough, sedation precautions  Patient Instructions: 1)  You have a sinus infection. 2)  Take medicines as prescribed:  cheratussin for night time use. 3)  Take guaifenesin 400mg  IR 1 1/2 pills in am and at noon with plenty of fluid to help mobilize mucous. 4)  Use nasal saline spray or  neti pot to help drainage of sinuses. 5)  Call us Friday if not any better. 6)  If you start having fevers >101.5, trouble swallowing or breathing, or are worsening instead of improving as expected, you may need to be seen again. 7)  Good to see you today, call clinic with questions.  Prescriptions: CHERATUSSIN AC 100-10 MG/5ML SYRP (GUAIFENESIN-CODEINE) one teaspoon q6 hours as needed cough, sedation precautions  #100cc x 0   Entered and Authorized by:   Eustaquio Boyden  MD   Signed by:   Eustaquio Boyden  MD on 10/10/2010   Method used:   Print then Give to Patient   RxID:   [id number]    Orders Added: 1)  Est. Patient Level III [14782]    Current Allergies (reviewed today): ! LIPITOR (ATORVASTATIN CALCIUM) ! * SHELL FISH * PENICILLINS GROUP CEFZIL AZITHROMYCIN (AZITHROMYCIN)

## 2010-11-29 NOTE — Letter (Signed)
Summary: Out of Work  [employer] at [facility]  [address] [place], [place] [address]   Phone: [phone]  Fax: [phone]    October 11, 2010   Employee:  BOBI DAUDELIN    To Whom It May Concern:   For Medical reasons, please excuse the above named employee from work for the following dates:  Start:  October 12, 2010   End:  October 14, 2010   If you need additional information, please feel free to contact our office.         Sincerely,    Eustaquio Boyden  MD

## 2010-11-29 NOTE — Letter (Signed)
Summary: Generic Letter  [place] at [facility]  [address] [place], [place] [address]   Phone: [phone]  Fax: [phone]    10/10/2010  Yvonne Stephenson [address] [place], [place]  [address]  To whom it may concern:  Please allow the above patient to consume fluids at work to help with her current illness. Please call with any questions.            Sincerely,     Selena Batten Dance CMA (AAMA)

## 2010-11-29 NOTE — Progress Notes (Signed)
Summary: wants notes to be out of work  Phone Note Call from Patient Call back at [phone]   Caller: Patient Call For: Dr Sharen Hones Summary of Call: Pt was seen yesterday and told to call back today if not any better.  She is not, says she is miserable.  She is asking for notes to be out of work tomorrow, saturday and sunday.  Please call when ready. Initial call taken by: Lowella Petties CMA, AAMA,  October 11, 2010 2:38 PM  Follow-up for Phone Call        ok to provide note for Fri-Sun.  put in chart.  please update if not better by next week.  if worse over weekend, to [facility]. Follow-up by: Eustaquio Boyden  MD,  October 11, 2010 2:58 PM  Additional Follow-up for Phone Call Additional follow up Details #1::        Letter printed for patient an placed up front for pick up. Patient is aware. Additional Follow-up by: Janee Morn CMA Duncan Dull),  October 11, 2010 3:27 PM

## 2010-11-29 NOTE — Letter (Signed)
Summary: Out of Work  [employer] at [facility]  [address] [place], [place] [address]   Phone: [phone]  Fax: [phone]    October 11, 2010   Employee:  YARIMAR LAVIS    To Whom It May Concern:   For Medical reasons, please excuse the above named employee from work for the following dates:  Start:  October 11, 2010   End:  October 14, 2010 (Can return on 10-15-10)   If you need additional information, please feel free to contact our office.         Sincerely,     Kim Dance CMA (AAMA) for  Eustaquio Boyden, MD

## 2010-11-29 NOTE — Progress Notes (Signed)
Summary: pt needs new scripts for mail order  Phone Note Refill Request Call back at Home Phone [phone] Call back at [phone] Message from:  Patient  Refills Requested: Medication #1:  BISOPROLOL FUMARATE 5 MG  TABS Take 1 tablet by mouth once a day  Medication #2:  PRAVASTATIN SODIUM 40 MG TABS take 1 by mouth once daily  Medication #3:  ZEGERID 40-1100 MG CAPS take 2 by mouth once daily Pt needs new written 90 day scripts for mail order.  Please call when ready and she will pick up.  Initial call taken by: Lowella Petties CMA, AAMA,  October 16, 2010 11:24 AM  Follow-up for Phone Call        patient didn't come in for labs per phone note 06/27/2010, also she doesn't have any appts scheduled, last time pt was seen was 09/26/2009 by Dr.Lundon Verdejo. ok to refill? DeShannon Smith CMA Duncan Dull)  October 16, 2010 11:38 AM   Okay to do single 3 month supply and she needs appt Cindee Salt MD  October 16, 2010 1:56 PM  spoke with patient she will schedule appt when she comes in to pick up rx's Follow-up by: Mervin Hack CMA Duncan Dull),  October 17, 2010 8:19 AM    Prescriptions: PRAVASTATIN SODIUM 40 MG TABS (PRAVASTATIN SODIUM) take 1 by mouth once daily  #90 x 0   Entered by:   Mervin Hack CMA (AAMA)   Authorized by:   Cindee Salt MD   Signed by:   Mervin Hack CMA (AAMA) on 10/17/2010   Method used:   Print then Give to Patient   RxID:   [id number] ZEGERID 40-1100 MG CAPS (OMEPRAZOLE-SODIUM BICARBONATE) take 2 by mouth once daily  #180 x 0   Entered by:   Mervin Hack CMA (AAMA)   Authorized by:   Cindee Salt MD   Signed by:   Mervin Hack CMA (AAMA) on 10/17/2010   Method used:   Print then Give to Patient   RxID:   [id number] BISOPROLOL FUMARATE 5 MG  TABS (BISOPROLOL FUMARATE) Take 1 tablet by mouth once a day  #90 x 0   Entered by:   Mervin Hack CMA (AAMA)   Authorized by:   Cindee Salt MD   Signed by:   Mervin Hack CMA (AAMA) on 10/17/2010   Method used:   Print then Give to Patient   RxID:   [id number]

## 2011-03-15 NOTE — Discharge Summary (Signed)
NAMEAMBERROSE, FRIEBEL NO.:  [id number]   MEDICAL RECORD NO.:  [id number]          PATIENT TYPE:  OIB   LOCATION:  9309                          [facility]ILITY:  [facility]   PHYSICIAN:  Ginger Carne, MD  DATE OF BIRTH:  1966/07/21   DATE OF ADMISSION:  07/28/2006  DATE OF DISCHARGE:  07/29/2006                                 DISCHARGE SUMMARY   REASON FOR HOSPITALIZATION:  Menometrorrhagia, adenomyosis.   IN-HOSPITAL PROCEDURES:  Total vaginal hysterectomy with preservation of  both tubes and ovaries.   FINAL DIAGNOSIS:  Total vaginal hysterectomy with preservation of both tubes  and ovaries.   HOSPITAL COURSE:  This is a 45 year old multiparous Caucasian female, status  post 3 cesarean sections and bilateral tubal ligation who underwent the  aforementioned procedure on July 28, 2006.  Appropriate workup prior to  said procedure was performed at the [facility] office.  Intraoperative  course was uneventful.  Postoperatively, the patient had an unremarkable  course.   VITAL SIGNS:  She was afebrile, voided well.  H&H was 11.1 and 31.6,  postoperatively.  Vital signs were stable.  ABDOMEN: Soft.  LUNGS: Clear.  CALVES:  Without tenderness.  VAGINA:  Scant vaginal flow.   Patient was discharged with routine postoperative instructions, including  contacting the office for temperature elevation above 100.4 degrees  Fahrenheit, increasing vaginal bleeding, genitourinary or gastrointestinal  complaints, or abdominal pain or tenderness.  Patient was discharged with  Percocet 5/325 1-2 every 4-6 hours to be alternated with ibuprofen 600 mg up  to 4 times a day every 6 hours.  Patient was advised about the use a stool  softener as necessary.  She will return in 4-5 weeks at the [facility]  office for her routine postoperative visit.      Ginger Carne, MD  Electronically Signed     SHB/MEDQ  D:  07/29/2006  T:  07/30/2006  Job:  [id number]

## 2011-03-15 NOTE — Group Therapy Note (Signed)
NAME:  Yvonne Stephenson, MARQUART NO.:  [id number]   MEDICAL RECORD NO.:  [id number]          PATIENT TYPE:  WOC   LOCATION:  [facility]                   FACILITY[facility]   PHYSICIAN:  Elsie Lincoln, MD      DATE OF BIRTH:  1965-11-01   DATE OF SERVICE:  02/20/2006                                    CLINIC NOTE   NOTE:  The patient was seen at [facility].   HISTORY OF PRESENT ILLNESS:  The patient is a 45 year old, G4, para 3-0-1-3,  LMP February 02, 2006, who presents for a first GYN visit to this clinic.  She  recently moved down from [place], and, by her report, is having heavier  periods.  She has been having heavy periods since 1995.  She was started on  birth control pills and did fine until she had a breast lump and stopped in  2005.  She is worried about breath cancer.  She also has had a D&C in 1996  for heavy bleeding.  She has never had an IUD or endometrial biopsy to  investigate the heavy bleeding.  She had 3 cesarean sections, and was never  told she had fibroid tumors to explain the heavy bleeding.  She also  complains that the periods have been more erratic over the past several  months, as well.  During her last period, she had severe dysmenorrhea and  had to take a friend's Tylenol with codeine.   PAST MEDICAL HISTORY:  1.  Recently started on blood pressure medications, which is now well      controlled.  2.  She had a history of hypercholesterolemia, which is now controlled only      on diet.   PAST SURGICAL HISTORY:  1.  Rhinoplasty in 2003.  2.  D&C.  3.  Cesarean section x3.   GYNECOLOGIC HISTORY:  1.  D&C in 1996.  2.  In 2005, breast lump.  3.  Questionable small ovarian cyst in 2001.  4.  Bilateral tubal ligation.   FAMILY HISTORY:  Mom had a clot in her leg during some heart issues.   ALLERGIES:  1.  AUGMENTIN.  2.  CEFZIL.   MEDICATIONS:  1.  Toprol.  2.  Folgard.   Last mammogram in 2004.  Last Pap smear in January of 2006.   REVIEW OF SYSTEMS:  A 13-point review of systems is positive for weight  loss, muscle aches, numbness and weakness of the fingers.   SOCIAL HISTORY:  Negative.  She does not work outside the home.  Does not  drink or do drugs.   PHYSICAL EXAMINATION:  VITAL SIGNS:  Pulse 73, blood pressure 125/78.  Weight is 159.  GENERAL:  She is well-developed, well-nourished, in no apparent distress.  HEENT:  Head is normocephalic and atraumatic.  Mouth and throat with no  erythema.  NECK:  Supple. No masses.  Normal-sized thyroid.  BREASTS:  No masses, no lymphadenopathy, no skin changes, no discharge from  nipples.  ABDOMEN:  Soft, nontender, nondistended.  No organomegaly.  No hernia.  Well-  healed Pfannenstiel skin incisions.  LUNGS:  Clear to auscultation bilaterally.  CV:  Regular rate and rhythm.  PELVIC:  Genitalia Tanner 5.  She had a pink normal rugae.  Cervix - a dot  of an os could be seen; however, it could not be penetrated with the brush.  The uterus feels small; however, it is limited due to body habitus.  Adnexa  no masses, nontender.  Urethra with no prolapse or tenderness.  Bladder  nontender.  EXTREMITIES:  No edema.   ASSESSMENT AND PLAN:  A 45 year old female for a GYN exam today.  Pap smear  done.  Mammogram ordered.  The patient is to come back in a couple weeks to  further talk about bleeding.  The patient was given prescriptions today for  Phenergan and Vicodin, and told to take Motrin around the clock for the next  period.  May need to have to take to the OR for D&C hysteroscopy to look for  bleeding cause.  Endometrial biopsy would most likely be impossible in the  office.           ______________________________  Elsie Lincoln, MD     KL/MEDQ  D:  02/20/2006  T:  02/21/2006  Job:  [id number]

## 2011-03-15 NOTE — Group Therapy Note (Signed)
NAME:  SHEKETA, ENDE NO.:  [id number]   MEDICAL RECORD NO.:  [id number]          PATIENT TYPE:  WOC   LOCATION:  [facility]                   [facility]ACILITY[facility]   PHYSICIAN:  Elsie Lincoln, MD      DATE OF BIRTH:  12/08/1965   DATE OF SERVICE:  03/13/2006                                    CLINIC NOTE   The patient presents for follow up results of Pap smear and mammogram.  Mammogram was _________.  Pap smear was normal.  The patient took Motrin  around the clock with her last period, and it was much lighter; however, the  Vicodin I gave her made her very nauseated.  She was unable to take that for  pain.  She was also supposed to come in with her last menses for endometrial  biopsy, but she had a stomach virus and did not come in, so we will try that  again this month with her to come in on her flow so we can do an endometrial  biopsy.  She is technically scheduled for April 05, 2006; however, if she is  not having her flow at that time, we will reschedule or come in sooner.  If  we are unable to get it, the patient will need a D&C hysteroscopy in the OR.           ______________________________  Elsie Lincoln, MD     KL/MEDQ  D:  03/13/2006  T:  03/13/2006  Job:  [id number]

## 2011-03-15 NOTE — Op Note (Signed)
[name], [name] NO.:  [id number]   MEDICAL RECORD NO.:  [id number]          PATIENT TYPE:  OIB   LOCATION:  9309                          [facility]   PHYSICIAN:  Ginger Carne, MD  DATE OF BIRTH:  [birth year]   DATE OF PROCEDURE:  07/28/2006  DATE OF DISCHARGE:                                 OPERATIVE REPORT   PREOPERATIVE DIAGNOSIS:  1. Adenomyosis.  2. Menometrorrhagia.   POSTOPERATIVE DIAGNOSES:  1. Adenomyosis.  2. Menometrorrhagia.   PROCEDURE:  Total vaginal hysterectomy with preservation of both tubes and  ovaries.   SURGEON:  Ginger Carne, MD   ASSISTANT:  None.   COMPLICATIONS:  None immediate.   ESTIMATED BLOOD LOSS:  Less than 50 mL.   SPECIMEN:  Uterus and cervix to pathology.   ANESTHESIA:  General.   OPERATIVE FINDINGS:  Uterus was 6 weeks in size, globular, no cervical  lesions.  Both tubes and ovaries appeared normal.   OPERATIVE PROCEDURE:  The patient prepped and draped in the usual fashion  and placed in lithotomy position.  A non-iodine-containing antiseptic  solution used, Foley catheter placed prior to the procedure.  Afterwards the  cervix was grasped with a double-tooth tenaculum anteriorly and posteriorly,  20 mL of Marcaine with epinephrine was injected circumferentially around the  cervix, concentrations per OR record.  Afterwards, 2 cm of anterior and  posterior vaginal epithelium were incised transversely.  The peritoneal  reflections identified and opened without injury to their respective organs.  Uterosacral and cardinal ligament complexes clamped, cut and ligated with 0  Vicryl suture.  This was extended to the uterine vasculature with the  ascending branches.  Utero-ovarian ligaments clamped, cut and ligated with  transfixion twice and cut.  Afterwards, bleeding points hemostatically  checked.  Blood  clots removed.  Irrigation again utilized.  Closure of the cuff in one layer  of  Vicryl  running interlocking suture.  The patient tolerated the procedure  well, returned to the post anesthesia recovery room in excellent condition.  Urine clear at the end of the procedure.      Ginger Carne, MD  Electronically Signed     SHB/MEDQ  D:  07/28/2006  T:  07/29/2006  Job:  [id number]

## 2011-04-04 ENCOUNTER — Telehealth: Payer: Self-pay | Admitting: *Deleted

## 2011-04-04 NOTE — Telephone Encounter (Signed)
I usually recommend aleve (naproxen) 2 twice a day or ibuprofen 200mg  --3 tabs up to four times a day. These work as well as Rx NSAIDs for most people

## 2011-04-04 NOTE — Telephone Encounter (Signed)
Patient says that her back is completely out, she is in so much pain that she can't move. She is asking if she can get an antiinflammatory called in to [facility].  I advised her that she would need an appt, but she says that she can't come in because she is unable to drive.

## 2011-04-04 NOTE — Telephone Encounter (Signed)
Spoke with patient and advised results   

## 2011-07-01 ENCOUNTER — Other Ambulatory Visit: Payer: Self-pay | Admitting: Internal Medicine

## 2011-07-03 ENCOUNTER — Other Ambulatory Visit: Payer: Self-pay | Admitting: Internal Medicine

## 2011-07-22 ENCOUNTER — Ambulatory Visit (INDEPENDENT_AMBULATORY_CARE_PROVIDER_SITE_OTHER): Payer: BC Managed Care – PPO | Admitting: Family Medicine

## 2011-07-22 ENCOUNTER — Encounter: Payer: Self-pay | Admitting: *Deleted

## 2011-07-22 ENCOUNTER — Encounter: Payer: Self-pay | Admitting: Family Medicine

## 2011-07-22 VITALS — BP 100/70 | HR 71 | Temp 98.5°F | Wt 180.8 lb

## 2011-07-22 DIAGNOSIS — H10029 Other mucopurulent conjunctivitis, unspecified eye: Secondary | ICD-10-CM

## 2011-07-22 DIAGNOSIS — H0019 Chalazion unspecified eye, unspecified eyelid: Secondary | ICD-10-CM

## 2011-07-22 DIAGNOSIS — H0011 Chalazion right upper eyelid: Secondary | ICD-10-CM

## 2011-07-22 MED ORDER — POLYMYXIN B-TRIMETHOPRIM 10000-0.1 UNIT/ML-% OP SOLN: 1.0000 [drp] | OPHTHALMIC | Status: AC

## 2011-07-22 NOTE — Progress Notes (Signed)
  Subjective:    Patient ID: Yvonne Stephenson, female    DOB: 22-Feb-1966, 45 y.o.   MRN: [id number]  HPI  Yvonne Stephenson, a 45 y.o. female presents today in the office for the following:    Yesterday morning.  R stye  Pink eye?  B eyes itchy and pinkish conj and sclera without h/o allergies. No significant gooing R eye medially with swelling on upper eyelid  Review of Systems ROS: GEN: Acute illness details above GI: Tolerating PO intake GU: maintaining adequate hydration and urination Pulm: No SOB Interactive and getting along well at home.  Otherwise, ROS is as per the HPI.     Objective:   Physical Exam   Physical Exam  Blood pressure 100/70, pulse 71, temperature 98.5 F (36.9 C), temperature source Oral, weight 180 lb 12.8 oz (82.01 kg), SpO2 98.00%.  GEN: WDWN, NAD, Non-toxic, A & O x 3 HEENT: Atraumatic, Normocephalic. Neck supple. No masses, No LAD. PERRLA. EOMI. Injected conj, r medial upper eyelid swelling without surrounding redness Ears and Nose: No external deformity. EXTR: No c/c/e NEURO Normal gait.  PSYCH: Normally interactive. Conversant. Not depressed or anxious appearing.  Calm demeanor.        Assessment & Plan:   1. Pink eye  trimethoprim-polymyxin b (POLYTRIM) ophthalmic solution  2. Chalazion of right upper eyelid      Refer to the patient instructions sections for details of plan shared with patient.   Certainly chalazion -- with history, may have pink eye, too

## 2011-07-22 NOTE — Patient Instructions (Signed)
   Sty A sty (hordeolum) is an infection of a gland in the eyelid located at the base of the eyelash. A sty may develop a white or yellow head of pus. It can be puffy (swollen). Usually, the sty will burst and pus will come out on its own. They do not leave lumps in the eyelid once they drain. A sty is often confused with another form of cyst of the eyelid called a chalazion. Chalazions occur within the eyelid and not on the edge where the bases of the eyelashes are. They often are red, sore and then form firm lumps in the eyelid. CAUSES  Germs (bacteria).   Lasting (chronic) eyelid inflammation.  SYMPTOMS  Tenderness, redness and swelling along the edge of the eyelid at the base of the eyelashes.   Sometimes, there is a white or yellow head of pus. It may or may not drain.  DIAGNOSIS An ophthalmologist will be able to distinguish between a sty and a chalazion and treat the condition appropriately.  TREATMENT  Styes are typically treated with warm packs (compresses) until drainage occurs.   In rare cases, medicines that kill germs (antibiotics) may be prescribed. These antibiotics may be in the form of drops, cream or pills.   If a hard lump has formed, it is generally necessary to do a small incision and remove the hardened contents of the cyst in a minor surgical procedure done in the office.   In suspicious cases, your caregiver may send the contents of the cyst to the lab to be certain that it is not a rare, but dangerous form of cancer of the glands of the eyelid.  HOME CARE INSTRUCTIONS  Wash your hands often and dry them with a clean towel. Avoid touching your eyelid. This may spread the infection to other parts of the eye.   Apply heat to your eyelid for 10 to 20 minutes, several times a day, to ease pain and help to heal it faster.   Do not squeeze the sty. Allow it to drain on its own. Wash your eyelid carefully 3 to 4 times per day to remove any pus.  SEEK IMMEDIATE MEDICAL  CARE IF:  The eye becomes painful or puffy (swollen).   Your vision changes.   The sty does not drain by itself within 3 days.   The sty comes back within a short period of time, even with treatment.   There is redness (inflammation) around the eye.   An unexplained oral temperature above 102 develops, or as your caregiver suggests.  Document Released: 07/24/2005 Document Re-Released: 08/11/2009 [facility] Patient Information 2011 [place], [place].

## 2011-08-07 ENCOUNTER — Other Ambulatory Visit: Payer: Self-pay | Admitting: Obstetrics & Gynecology

## 2011-08-07 DIAGNOSIS — Z1231 Encounter for screening mammogram for malignant neoplasm of breast: Secondary | ICD-10-CM

## 2011-09-05 ENCOUNTER — Ambulatory Visit (HOSPITAL_COMMUNITY)
Admission: RE | Admit: 2011-09-05 | Discharge: 2011-09-05 | Disposition: A | Discharge status: Home / Self Care | Payer: BC Managed Care – PPO | Source: Ambulatory Visit | Attending: Obstetrics & Gynecology | Admitting: Obstetrics & Gynecology

## 2011-09-05 DIAGNOSIS — Z1231 Encounter for screening mammogram for malignant neoplasm of breast: Secondary | ICD-10-CM

## 2011-10-26 ENCOUNTER — Other Ambulatory Visit: Payer: Self-pay | Admitting: Internal Medicine

## 2011-10-31 ENCOUNTER — Telehealth: Payer: Self-pay | Admitting: *Deleted

## 2011-10-31 NOTE — Telephone Encounter (Signed)
Dr.Letvak's patient:  Patient calling stating that she went to a funeral and now she thinks she's having a panic attack? I spoke with patient and she's fine right now, but would like something called in to ease her nerves, I advised Dr.Letvak was not in and that I would need to ask another physician and she would probably need an appointment ( none are available). Please advise

## 2011-10-31 NOTE — Telephone Encounter (Signed)
May make appt with me 12:15pm tomorrow.  Let me know if pt thinks cannot wait until tomorrow.

## 2011-10-31 NOTE — Telephone Encounter (Signed)
Spoke with patient and advised results, appt scheduled 

## 2011-11-01 ENCOUNTER — Encounter: Payer: Self-pay | Admitting: Family Medicine

## 2011-11-01 ENCOUNTER — Ambulatory Visit (INDEPENDENT_AMBULATORY_CARE_PROVIDER_SITE_OTHER): Payer: Managed Care, Other (non HMO) | Admitting: Family Medicine

## 2011-11-01 VITALS — BP 142/90 | HR 89 | Temp 98.2°F | Resp 20 | Ht 61.0 in | Wt 189.2 lb

## 2011-11-01 DIAGNOSIS — F411 Generalized anxiety disorder: Secondary | ICD-10-CM

## 2011-11-01 DIAGNOSIS — F410 Panic disorder [episodic paroxysmal anxiety] without agoraphobia: Secondary | ICD-10-CM

## 2011-11-01 DIAGNOSIS — R000 Tachycardia, unspecified: Secondary | ICD-10-CM

## 2011-11-01 MED ORDER — LORAZEPAM 1 MG PO TABS: 1.0000 mg | ORAL_TABLET | Freq: Three times a day (TID) | ORAL | Status: DC | PRN

## 2011-11-01 MED ORDER — LORAZEPAM 1 MG PO TABS: 1.0000 mg | ORAL_TABLET | Freq: Two times a day (BID) | ORAL | Status: AC | PRN

## 2011-11-01 NOTE — Progress Notes (Signed)
Subjective:    Patient ID: Yvonne Stephenson, female    DOB: [birth year], 46 y.o.   MRN: [id number]  HPI CC: anxiety attacks  46 yo patient of Dr/ Karle Starch with h/o anxiety, HTN, HLD, GERD presents with:   Wednesday night went to friend's dad's wake/funeral, when left had severe anxiety attack - brought back memories of father who passed away 6 yrs ago.  Included chest tightness, heart racing, SOB, tightness in upper back.  No HA, dizziness.  Not more stressed than normal recently.  Chest tightness/pressure has stayed some.  Denies nausea/vomiting, came on when standing.  Improved with time and relaxing.  Anxiety started first then rapid heart beats described as heart beating out of chest but denies irregular heart beat or skipped beats.  Denies increased stress recently otherwise, good job, things good at home.  Has never been on meds for anxiety.  Has had attacks like this before, last was 7 yrs ago, was more stressed then.  Had EKG done in past.  No smokers at home.  + fmhx CAD - both parents with MI (father age 91, mother age late 80s).  Pt with h/o HLD, last check normal. Lab Results  Component Value Date   CHOL 176 09/26/2009   HDL 32.30* 09/26/2009   LDLCALC 114* 09/26/2009   LDLDIRECT 128.7 09/14/2008   TRIG 147.0 09/26/2009   CHOLHDL 5 09/26/2009   Medications and allergies reviewed and updated in chart.  Past histories reviewed and updated if relevant as below. Patient Active Problem List  Diagnoses  . HYPERLIPIDEMIA  . ANXIETY  . OTHER ACUTE REACTIONS TO STRESS  . DYSFUNCTION, EUSTACHIAN TUBE  . HYPERTENSION  . GERD  . GANGLION, TENDON SHEATH  . PALPITATIONS  . COUGH  . BURN, SECOND DEGREE, ARM   Past Medical History  Diagnosis Date  . Anxiety   . GERD (gastroesophageal reflux disease)   . Hyperlipidemia   . Hypertension    Past Surgical History  Procedure Date  . Nasal stenosis repair 2003   . Breast surgery 2004    LUMPECTOMY  . Cesarean section     3    . Tubal ligation   . Abdominal hysterectomy 07-2006  . Carpal tunnel release 4-10    LEFT (Dr. Brynda Greathouse)   History  Substance Use Topics  . Smoking status: Never Smoker   . Smokeless tobacco: Not on file  . Alcohol Use: Yes   Family History  Problem Relation Age of Onset  . Heart attack Mother   . Cancer Mother     lung  . Heart disease Mother   . Hypertension Mother   . Heart attack Father   . Stroke Father   . Heart disease Father   . Hypertension Father   . Diabetes Maternal Uncle   . Cancer Maternal Grandmother   . Heart failure Maternal Grandfather   . Heart failure Paternal Grandmother    Allergies  Allergen Reactions  . Atorvastatin     REACTION: hip pain  . Azithromycin     REACTION: tongue felt funny  . Cefprozil     REACTION: rash  . Penicillins     REACTION: difficulty breathing   Current Outpatient Prescriptions on File Prior to Visit  Medication Sig Dispense Refill  . bisoprolol (ZEBETA) 5 MG tablet TAKE 1 TABLET BY MOUTH ONCE A DAY  30 tablet  0  . omeprazole-sodium bicarbonate (ZEGERID) 40-1100 MG per capsule Take 2 capsules by mouth daily before breakfast.        .  pravastatin (PRAVACHOL) 40 MG tablet TAKE 1 TABLET BY MOUTH ONCE A DAY  30 tablet  4   Review of Systems Per HPI    Objective:   Physical Exam  Nursing note and vitals reviewed. Constitutional: She appears well-developed and well-nourished. No distress.  HENT:  Head: Normocephalic and atraumatic.  Mouth/Throat: Oropharynx is clear and moist. No oropharyngeal exudate.  Eyes: Conjunctivae are normal. Pupils are equal, round, and reactive to light. No scleral icterus.  Neck: Normal range of motion. Neck supple.  Cardiovascular: Normal rate, regular rhythm, normal heart sounds and intact distal pulses.   No murmur heard. Pulmonary/Chest: Effort normal and breath sounds normal. No respiratory distress. She has no wheezes. She has no rales.  Musculoskeletal: She exhibits no edema.   Lymphadenopathy:    She has no cervical adenopathy.  Skin: Skin is warm and dry. No rash noted.  Psychiatric: She has a normal mood and affect.       Tears up with discussion of recent funeral and father          Assessment & Plan:

## 2011-11-01 NOTE — Assessment & Plan Note (Addendum)
Consistent with anxiety attacks after stressful event (funeral) that reminded her of father's passing. Given fmhx CAD and endorsed tachycardia, checked EKG today - NSR 72, normal axis, intervals, no hypertrophy or acute ST/T changes Treat with ativan prn.  Discussed sedation precautions, discussed risks of benzo use, temporary measure, but feel benefits currently outweight risks. If continued need, consider better long term strategy. Return to clinic if not improved with this for further evaluation.

## 2011-11-01 NOTE — Patient Instructions (Signed)
Sound like anxiety attacks. EKG looked ok today. Treat with ativan as needed  may take 1/2 to 1 pill as needed. If continued need after next few weeks, return to discuss better long term management (other med vs counseling). If not improved with this and heart racing or discomfort continues, please return to be seen.

## 2011-11-21 ENCOUNTER — Telehealth: Payer: Self-pay | Admitting: *Deleted

## 2011-11-21 NOTE — Telephone Encounter (Signed)
Patient calling stating that she's having pain in her right arm and SOB with walking up steps, I advised on her last visit her EKG was fine and per pt it's not her heart, I asked if she was still taking the Ativan and she stated no. Carried and I both offered 12:00 with Dr. Para March and she couldn't make it, she has to be at work at 12, I also offered tomorrow with Dr.Letvak, pt just wants a call back with advice, ok to leave message on cell phone pt will be at work.

## 2011-11-21 NOTE — Telephone Encounter (Signed)
I would recommend 1/2-1 of the ativan, only when she has these spells If she has persistent spells, we can try a daily medication  (the lorazepam can cause dependence if used daily but not if only used occasionally when attacks come on)

## 2011-11-21 NOTE — Telephone Encounter (Signed)
Spoke with patient and she just took 1 ativan, appt made for 11/26/11

## 2011-11-26 ENCOUNTER — Telehealth: Payer: Self-pay | Admitting: *Deleted

## 2011-11-26 ENCOUNTER — Ambulatory Visit (INDEPENDENT_AMBULATORY_CARE_PROVIDER_SITE_OTHER): Payer: Managed Care, Other (non HMO) | Admitting: Internal Medicine

## 2011-11-26 ENCOUNTER — Encounter: Payer: Self-pay | Admitting: Internal Medicine

## 2011-11-26 DIAGNOSIS — F411 Generalized anxiety disorder: Secondary | ICD-10-CM

## 2011-11-26 DIAGNOSIS — F410 Panic disorder [episodic paroxysmal anxiety] without agoraphobia: Secondary | ICD-10-CM

## 2011-11-26 MED ORDER — POLYMYXIN B-TRIMETHOPRIM 10000-0.1 UNIT/ML-% OP SOLN: 1.0000 [drp] | OPHTHALMIC | Status: DC

## 2011-11-26 MED ORDER — LORAZEPAM 1 MG PO TABS: 0.5000 mg | ORAL_TABLET | Freq: Two times a day (BID) | ORAL | Status: DC | PRN

## 2011-11-26 NOTE — Telephone Encounter (Signed)
Okay to refill the prescription but she will need reevaluation if she has ongonig problems---actually might be best to see an eye doctor if the symptoms persist after another Rx course

## 2011-11-26 NOTE — Telephone Encounter (Signed)
rx sent to pharmacy by e-script Left message on cell phone with results

## 2011-11-26 NOTE — Progress Notes (Signed)
  Subjective:    Patient ID: Yvonne Stephenson, female    DOB: 26-Mar-1966, 46 y.o.   MRN: [id number]  HPI Had "horrible" experience which prompted the last visit Has had some milder anxiety since then Has used the lorazepam regularly---using 1/2 bid most days  Has "not felt right" Gets sense of "squeezing in my chest"  Did not have ongoing anxiety before the funeral Only since then  Working full time Generally satisfied with work No troubles at home  Current Outpatient Prescriptions on File Prior to Visit  Medication Sig Dispense Refill  . bisoprolol (ZEBETA) 5 MG tablet TAKE 1 TABLET BY MOUTH ONCE A DAY  30 tablet  0  . omeprazole-sodium bicarbonate (ZEGERID) 40-1100 MG per capsule Take 2 capsules by mouth daily before breakfast.        . pravastatin (PRAVACHOL) 40 MG tablet TAKE 1 TABLET BY MOUTH ONCE A DAY  30 tablet  4    Allergies  Allergen Reactions  . Atorvastatin     REACTION: hip pain  . Azithromycin     REACTION: tongue felt funny  . Cefprozil     REACTION: rash  . Penicillins     REACTION: difficulty breathing    Past Medical History  Diagnosis Date  . Anxiety   . GERD (gastroesophageal reflux disease)   . Hyperlipidemia   . Hypertension     Past Surgical History  Procedure Date  . Nasal stenosis repair 2003   . Breast surgery 2004    LUMPECTOMY  . Cesarean section     3  . Tubal ligation   . Abdominal hysterectomy 07-2006  . Carpal tunnel release 4-10    LEFT (Dr. Brynda Greathouse)    Family History  Problem Relation Age of Onset  . Heart attack Mother   . Cancer Mother     lung  . Heart disease Mother   . Hypertension Mother   . Heart attack Father   . Stroke Father   . Heart disease Father   . Hypertension Father   . Diabetes Maternal Uncle   . Cancer Maternal Grandmother   . Heart failure Maternal Grandfather   . Heart failure Paternal Grandmother     History   Social History  . Marital Status: Married    Spouse Name: N/A    Number of  Children: 3  . 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 Smoking status: Never Smoker   . Smokeless tobacco: Not on file  . Alcohol Use: Yes  . Drug Use: No  . Sexually Active: Not on file   Other Topics Concern  . Not on file   Social History Narrative   Does walk a little for exercise   Review of Systems Sleeps okay most nights Appetite is okay---eats irregularly No depressed mood or anhedonia     Objective:   Physical Exam  Constitutional: She appears well-developed and well-nourished. No distress.  Psychiatric: She has a normal mood and affect. Her behavior is normal. Judgment and thought content normal.          Assessment & Plan:

## 2011-11-26 NOTE — Assessment & Plan Note (Signed)
Has resolved but continued the lorazepam Discussed that this should be prn only

## 2011-11-26 NOTE — Telephone Encounter (Signed)
Pt calling asking for a refill of trimethoprim-polymyxin b (POLYTRIM) ophthalmic solution 10 mL  Place 1 drop into the left eye every 4 (four) hours. Pt was seen by Dr.Copland 06/2011 for this and now would like a refill of this, per pt she can feel the infection coming back again, pt was just seen today for another reason she's on her way to work. I adivsed she may need to be seen and per pt she doesn't have another co-pay, I advised it could be something different than before and she wanted to hear what dr.letvak had to say. Please advise.

## 2011-11-26 NOTE — Assessment & Plan Note (Signed)
Does not seem to have a generalized anxiety disorder that requires ongoing Rx

## 2011-12-12 ENCOUNTER — Telehealth: Payer: Self-pay

## 2011-12-12 NOTE — Telephone Encounter (Signed)
I have no record of it so probably orthopedic It is an antiinflammatory like motrin or aleve  It does sound like an allergic reaction If no swallowing or breathing problem, probably doesn't need to be seen Can try cetirizine 10mg  daily (zyrtec) &/or benedryl 25mg  for the swelling.  Put on her allergy list

## 2011-12-12 NOTE — Telephone Encounter (Signed)
.  left message to have patient return my call.  

## 2011-12-12 NOTE — Telephone Encounter (Signed)
Pt had neck pain and took Meloxicam 12/11/11 and took another tablet today at 11am. About 12:30 am today pt said her face appeared swollen and then when she got to work co workers told her her upper lip was swollen also. Pt said her eyes feel funny and she is having trouble focusing. Pt was not sure if needed to be seen. Pt is not sure if orthopedic or our office gave her Meloxicam. She said prior to yesterday she had not taken Meloxicam in about 5 months.

## 2011-12-12 NOTE — Telephone Encounter (Signed)
Spoke with husband and advised results, husband was asking about a EPI pen? I advised that is only for emergency situations, like if she can't breathe or swallow. Per husband he will get the message to the pt

## 2011-12-23 ENCOUNTER — Ambulatory Visit (INDEPENDENT_AMBULATORY_CARE_PROVIDER_SITE_OTHER): Payer: Managed Care, Other (non HMO) | Admitting: Family Medicine

## 2011-12-23 ENCOUNTER — Encounter: Payer: Self-pay | Admitting: Family Medicine

## 2011-12-23 VITALS — BP 112/78 | HR 84 | Temp 98.2°F | Wt 191.8 lb

## 2011-12-23 DIAGNOSIS — J329 Chronic sinusitis, unspecified: Secondary | ICD-10-CM | POA: Insufficient documentation

## 2011-12-23 DIAGNOSIS — J069 Acute upper respiratory infection, unspecified: Secondary | ICD-10-CM

## 2011-12-23 DIAGNOSIS — Z20818 Contact with and (suspected) exposure to other bacterial communicable diseases: Secondary | ICD-10-CM

## 2011-12-23 DIAGNOSIS — Z2089 Contact with and (suspected) exposure to other communicable diseases: Secondary | ICD-10-CM

## 2011-12-23 LAB — POCT RAPID STREP A (OFFICE): Rapid Strep A Screen: NEGATIVE

## 2011-12-23 NOTE — Progress Notes (Signed)
  Subjective:    Patient ID: Yvonne Stephenson, female    DOB: April 21, 1966, 46 y.o.   MRN: [id number]  HPI CC: ST  1 wk h/o ST.  Last night worse.  Felt really dry throat when sleeping.  + sneezing, L ear pain.  Mild dry cough and mild PNdrainage.  +sinus congestion as well.  So far has tried tylenol.  No fevers/chills, abd pain, n/v, rashes, tooth pain, headache.  No h/o allergies, asthma.  No smokers at home.  Son sick with strep 2 wks ago.  Review of Systems Per HPI    Objective:   Physical Exam  Nursing note and vitals reviewed. Constitutional: She appears well-developed and well-nourished. No distress.  HENT:  Head: Normocephalic and atraumatic.  Right Ear: Hearing, tympanic membrane, external ear and ear canal normal.  Left Ear: Hearing, tympanic membrane, external ear and ear canal normal.  Nose: Nose normal. No mucosal edema or rhinorrhea. Right sinus exhibits no maxillary sinus tenderness and no frontal sinus tenderness. Left sinus exhibits no maxillary sinus tenderness and no frontal sinus tenderness.  Mouth/Throat: Uvula is midline and mucous membranes are normal. Posterior oropharyngeal erythema present. No oropharyngeal exudate, posterior oropharyngeal edema or tonsillar abscesses.  Eyes: Conjunctivae and EOM are normal. Pupils are equal, round, and reactive to light. No scleral icterus.  Neck: Normal range of motion. Neck supple.  Cardiovascular: Normal rate, regular rhythm, normal heart sounds and intact distal pulses.   No murmur heard. Pulmonary/Chest: Effort normal and breath sounds normal. No respiratory distress. She has no wheezes. She has no rales.  Musculoskeletal: She exhibits no edema.  Lymphadenopathy:    She has no cervical adenopathy.  Skin: Skin is warm and dry. No rash noted.       Assessment & Plan:

## 2011-12-23 NOTE — Patient Instructions (Signed)
You have pharyngitis, but likely viral. Push fluids and plenty of rest. May use ibuprofen for throat inflammation (start low). Salt water gargles. Suck on cold things like popsicles or warm things like herbal teas (whichever soothes the throat better). Return if fever >101.5, worsening pain, or trouble opening/closing mouth, or hoarse voice. Good to see you today, call clinic with questions.

## 2011-12-23 NOTE — Assessment & Plan Note (Signed)
Viral URTI.  RST negative. Supportive care as instructions.

## 2011-12-25 ENCOUNTER — Telehealth: Payer: Self-pay | Admitting: Internal Medicine

## 2011-12-25 NOTE — Telephone Encounter (Signed)
Spoke with patient and she has an appt with Dr.G in the morning at 9am because of her work schedule she can't come at 4:45 she doesn't get off until 5:30.

## 2011-12-25 NOTE — Telephone Encounter (Signed)
Triage Record Num: [id number] Operator: Revonda Humphrey Patient Name: Yvonne Stephenson Call Date & Time: 12/25/2011 10:02:14AM Patient Phone: [phone] PCP: Tillman Abide Patient Gender: Female PCP Fax : [phone] Patient DOB: Mar 27, 1966 Practice Name: [facility] Reason for Call: Caller: Yvonne Stephenson/Patient; PCP: Tillman Abide I.; CB#: [phone]; ; ; Call regarding Cough/Congestion; Protocol(s) Used: Cough - Adult Recommended Outcome per Protocol: See Provider within 24 hours Reason for Outcome: Gradual onset of cough when lying down AND having to sleep on more than one pillow or with head of bed elevated Care Advice: Limit or avoid exposure to irritants and allergens (e.g. air pollution, smoke/smoking, chemicals, dust, pollen, pet dander, etc.) ~ ~ Avoid any activity that produces symptoms until evaluated by provider. ~ Sleep with head elevated on several pillows or in a recliner. Provide for rest periods by spacing activities that require physical exertion. Promote calm, restful environment; anxiety increases breathing difficulty. ~ Follow your treatment plan as given to you by your provider to manage symptoms, including reducing salt intake, limiting intake of fluids, monitoring daily weights, and exercising as instructed. ~ 12/25/2011 10:13:16AM Page 1 of 1 [email or web address]

## 2011-12-25 NOTE — Telephone Encounter (Signed)
Okay to add on at 4:45 tomorrow if needed

## 2011-12-26 ENCOUNTER — Encounter: Payer: Self-pay | Admitting: *Deleted

## 2011-12-26 ENCOUNTER — Encounter: Payer: Self-pay | Admitting: Family Medicine

## 2011-12-26 ENCOUNTER — Ambulatory Visit (INDEPENDENT_AMBULATORY_CARE_PROVIDER_SITE_OTHER): Payer: Managed Care, Other (non HMO) | Admitting: Family Medicine

## 2011-12-26 VITALS — BP 132/86 | HR 78 | Temp 98.2°F | Wt 191.0 lb

## 2011-12-26 DIAGNOSIS — J329 Chronic sinusitis, unspecified: Secondary | ICD-10-CM

## 2011-12-26 MED ORDER — DOXYCYCLINE HYCLATE 100 MG PO CAPS: 100.0000 mg | ORAL_CAPSULE | Freq: Two times a day (BID) | ORAL | Status: AC

## 2011-12-26 MED ORDER — GUAIFENESIN-CODEINE 100-10 MG/5ML PO SYRP: 5.0000 mL | ORAL_SOLUTION | Freq: Every evening | ORAL | Status: AC | PRN

## 2011-12-26 NOTE — Telephone Encounter (Signed)
okay

## 2011-12-26 NOTE — Assessment & Plan Note (Signed)
Given duration and progression of sxs, will cover for sinusitis with doxy. cheratussin for cough at night.  rec mucinex to break up mucous. Update [place] if worsening.

## 2011-12-26 NOTE — Progress Notes (Signed)
  Subjective:    Patient ID: Yvonne Stephenson, female    DOB: 1966/03/14, 46 y.o.   MRN: [id number]  HPI [id number] cough  Seen here 12/23/2011 with dx viral URTI.  Since then worse cough, trouble catching breath and trouble laying flat at night.  Feeling very congested in head and chest.  Having frontal sinus pressure headaches.  ST has improved however.  Chest tightness and SOB endorsed with coughing.  + PNDrainage.  Cough keeping her up at night.  Using lifesaver and cold ease cough drop.  And ibuprofen and nyquil which didn't help.  No fevers/chills, abd pain.  No h/o asthma.  No smokers at home.  Review of Systems Per HPI    Objective:   Physical Exam  Nursing note and vitals reviewed. Constitutional: She appears well-developed and well-nourished. No distress.  HENT:  Head: Normocephalic and atraumatic.  Right Ear: Hearing, tympanic membrane, external ear and ear canal normal.  Left Ear: Hearing, tympanic membrane, external ear and ear canal normal.  Nose: No mucosal edema or rhinorrhea. Right sinus exhibits no maxillary sinus tenderness and no frontal sinus tenderness. Left sinus exhibits frontal sinus tenderness. Left sinus exhibits no maxillary sinus tenderness.  Mouth/Throat: Uvula is midline, oropharynx is clear and moist and mucous membranes are normal. No oropharyngeal exudate, posterior oropharyngeal edema, posterior oropharyngeal erythema or tonsillar abscesses.  Eyes: Conjunctivae and EOM are normal. Pupils are equal, round, and reactive to light. No scleral icterus.  Neck: Normal range of motion. Neck supple.  Cardiovascular: Normal rate, regular rhythm, normal heart sounds and intact distal pulses.   No murmur heard. Pulmonary/Chest: Effort normal and breath sounds normal. No respiratory distress. She has no wheezes. She has no rales.  Lymphadenopathy:    She has no cervical adenopathy.  Skin: Skin is warm and dry. No rash noted.       Assessment & Plan:

## 2011-12-26 NOTE — Patient Instructions (Signed)
You may have developed a sinus infection.  Take medicine as prescribed: doxycycline twice daily for 10 days. Push fluids and plenty of rest. Nasal saline irrigation or neti pot to help drain sinuses. May use simple mucinex with plenty of fluid to help mobilize mucous. cheratussin for cough at night. Let us know if fever >101.5, trouble opening/closing mouth, difficulty swallowing, or worsening - you may need to be seen again.

## 2012-01-08 ENCOUNTER — Ambulatory Visit (INDEPENDENT_AMBULATORY_CARE_PROVIDER_SITE_OTHER): Payer: Managed Care, Other (non HMO) | Admitting: Family Medicine

## 2012-01-08 ENCOUNTER — Ambulatory Visit: Payer: Managed Care, Other (non HMO) | Admitting: Family Medicine

## 2012-01-08 ENCOUNTER — Encounter: Payer: Self-pay | Admitting: Family Medicine

## 2012-01-08 ENCOUNTER — Encounter: Payer: Self-pay | Admitting: *Deleted

## 2012-01-08 VITALS — BP 140/84 | HR 76 | Temp 98.3°F | Wt 190.0 lb

## 2012-01-08 DIAGNOSIS — M549 Dorsalgia, unspecified: Secondary | ICD-10-CM

## 2012-01-08 DIAGNOSIS — R509 Fever, unspecified: Secondary | ICD-10-CM

## 2012-01-08 LAB — CBC WITH DIFFERENTIAL/PLATELET
Basophils Absolute: 0 10*3/uL (ref 0.0–0.1)
Basophils Relative: 0.5 % (ref 0.0–3.0)
Eosinophils Absolute: 0.2 10*3/uL (ref 0.0–0.7)
Eosinophils Relative: 3.2 % (ref 0.0–5.0)
HCT: 40.7 % (ref 36.0–46.0)
Hemoglobin: 13.4 g/dL (ref 12.0–15.0)
Lymphocytes Relative: 22.1 % (ref 12.0–46.0)
Lymphs Abs: 1.5 10*3/uL (ref 0.7–4.0)
MCHC: 32.9 g/dL (ref 30.0–36.0)
MCV: 83.4 fl (ref 78.0–100.0)
Monocytes Absolute: 0.5 10*3/uL (ref 0.1–1.0)
Monocytes Relative: 7.1 % (ref 3.0–12.0)
Neutro Abs: 4.4 10*3/uL (ref 1.4–7.7)
Neutrophils Relative %: 67.1 % (ref 43.0–77.0)
Platelets: 165 10*3/uL (ref 150.0–400.0)
RBC: 4.87 Mil/uL (ref 3.87–5.11)
RDW: 13.6 % (ref 11.5–14.6)
WBC: 6.6 10*3/uL (ref 4.5–10.5)

## 2012-01-08 LAB — POCT URINALYSIS DIPSTICK
Bilirubin, UA: NEGATIVE
Blood, UA: NEGATIVE
Glucose, UA: NEGATIVE
Ketones, UA: NEGATIVE
Leukocytes, UA: NEGATIVE
Nitrite, UA: NEGATIVE
Protein, UA: NEGATIVE
Spec Grav, UA: <=1.005
Urobilinogen, UA: NEGATIVE
pH, UA: 6.5

## 2012-01-08 MED ORDER — CYCLOBENZAPRINE HCL 10 MG PO TABS: 10.0000 mg | ORAL_TABLET | Freq: Three times a day (TID) | ORAL | Status: DC | PRN

## 2012-01-08 NOTE — Progress Notes (Signed)
Patient Name: Yvonne Stephenson Date of Birth: 05-12-1966 Age: 46 y.o. Medical Record Number: [id number] Gender: female Date of Encounter: 01/08/2012  History of Present Illness:  Yvonne Stephenson is a 29 y.o. very pleasant female patient who presents with the following:  Monday, had a low grade fever and slept for about 15 hours. Low back woke her up this morning. 101 fever on Monday. No UTI symptoms. Just came off of some ABX.  No known injury.   Pleasant patient who had some low grade fever on Monday, 3 days ago, she slept a great deal that day. She started to have some low back pain that began this morning. No known injury. No radiculopathy. No bowel or bladder incontinence. No numbness. No tingling.  No dysuria, no hematuria. No urgency.  Past Medical History, Surgical History, Social History, Family History, Problem List, Medications, and Allergies have been reviewed and updated if relevant.  Review of Systems:  GEN: No fevers, chills. Nontoxic. Primarily MSK c/o today. MSK: Detailed in the HPI GI: tolerating PO intake without difficulty Neuro: No numbness, parasthesias, or tingling associated. Otherwise the pertinent positives of the ROS are noted above.    Physical Examination: Filed Vitals:   01/08/12 1214  BP: 140/84  Pulse: 76  Temp: 98.3 F (36.8 C)  TempSrc: Oral  Weight: 190 lb (86.183 kg)    There is no height on file to calculate BMI.   GEN: Well-developed,well-nourished,in no acute distress; alert,appropriate and cooperative throughout examination HEENT: Normocephalic and atraumatic without obvious abnormalities. Ears, externally no deformities PULM: Breathing comfortably in no respiratory distress EXT: No clubbing, cyanosis, or edema PSYCH: Normally interactive. Cooperative during the interview. Pleasant. Friendly and conversant. Not anxious or depressed appearing. Normal, full affect.  Range of motion at  the waist: Flexion: normal Extension:  normal Lateral bending: normal Rotation: all normal  No echymosis or edema Rises to examination table with no difficulty Gait: non antalgic  Inspection/Deformity: N Paraspinus Tenderness: mild paraspinous tenderness around L4-S1.  No CVA tenderness  B Ankle Dorsiflexion (L5,4): 5/5 B Great Toe Dorsiflexion (L5,4): 5/5 Heel Walk (L5): WNL Toe Walk (S1): WNL Rise/Squat (L4): WNL  SENSORY B Medial Foot (L4): WNL B Dorsum (L5): WNL B Lateral (S1): WNL Light Touch: WNL Pinprick: WNL  REFLEXES Knee (L4): 2+ Ankle (S1): 2+  B SLR, seated: neg B SLR, supine: neg B FABER: neg B Reverse FABER: neg B Greater Troch: NT B Log Roll: neg B Stork: NT B Sciatic Notch: NT   Assessment and Plan:  1. Back pain  POCT urinalysis dipstick, CBC with Differential, DISCONTINUED: cyclobenzaprine (FLEXERIL) 10 MG tablet  2. Fever  CBC with Differential    Orders Today: Orders Placed This Encounter  Procedures  . CBC with Differential  . POCT urinalysis dipstick    Medications Today: Meds ordered this encounter  Medications  . DISCONTD: cyclobenzaprine (FLEXERIL) 10 MG tablet    Sig: Take 1 tablet (10 mg total) by mouth 3 (three) times daily as needed for muscle spasms.    Dispense:  45 tablet    Refill:  2     UA is negative.  CBC:    Component Value Date/Time   WBC 6.6 01/08/2012 1308   HGB 13.4 01/08/2012 1308   HCT 40.7 01/08/2012 1308   PLT 165.0 01/08/2012 1308   MCV 83.4 01/08/2012 1308   NEUTROABS 4.4 01/08/2012 1308   LYMPHSABS 1.5 01/08/2012 1308   MONOABS 0.5 01/08/2012 1308   EOSABS 0.2 01/08/2012  1308   BASOSABS 0.0 01/08/2012 1308    Back pain, most likely musculoskeletal without clear origin. Patient did have mild fever several days ago, but that seems to have resolved. This does not appear to be urinary tract in origin. Moist heat, massage, scheduled anti-inflammatories at home. The patient is able to tolerate ibuprofen.  She is going to call back if she  worsens or has a resurgence of fever.

## 2012-01-09 ENCOUNTER — Encounter (HOSPITAL_COMMUNITY): Payer: Self-pay

## 2012-01-09 ENCOUNTER — Emergency Department (HOSPITAL_COMMUNITY): Payer: Managed Care, Other (non HMO)

## 2012-01-09 ENCOUNTER — Emergency Department (INDEPENDENT_AMBULATORY_CARE_PROVIDER_SITE_OTHER)
Admission: EM | Admit: 2012-01-09 | Discharge: 2012-01-09 | Disposition: A | Discharge status: Nursing Home (Includes ICF) | Payer: Managed Care, Other (non HMO) | Source: Home / Self Care | Attending: Emergency Medicine | Admitting: Emergency Medicine

## 2012-01-09 ENCOUNTER — Telehealth: Payer: Self-pay | Admitting: Internal Medicine

## 2012-01-09 ENCOUNTER — Emergency Department (HOSPITAL_COMMUNITY)
Admission: EM | Admit: 2012-01-09 | Discharge: 2012-01-09 | Disposition: A | Discharge status: Home / Self Care | Payer: Managed Care, Other (non HMO) | Attending: Emergency Medicine | Admitting: Emergency Medicine

## 2012-01-09 ENCOUNTER — Encounter (HOSPITAL_COMMUNITY): Payer: Self-pay | Admitting: Nurse Practitioner

## 2012-01-09 DIAGNOSIS — D696 Thrombocytopenia, unspecified: Secondary | ICD-10-CM

## 2012-01-09 DIAGNOSIS — R109 Unspecified abdominal pain: Secondary | ICD-10-CM

## 2012-01-09 DIAGNOSIS — F411 Generalized anxiety disorder: Secondary | ICD-10-CM | POA: Insufficient documentation

## 2012-01-09 DIAGNOSIS — I10 Essential (primary) hypertension: Secondary | ICD-10-CM | POA: Insufficient documentation

## 2012-01-09 DIAGNOSIS — I880 Nonspecific mesenteric lymphadenitis: Secondary | ICD-10-CM

## 2012-01-09 DIAGNOSIS — K219 Gastro-esophageal reflux disease without esophagitis: Secondary | ICD-10-CM | POA: Insufficient documentation

## 2012-01-09 DIAGNOSIS — E785 Hyperlipidemia, unspecified: Secondary | ICD-10-CM | POA: Insufficient documentation

## 2012-01-09 LAB — COMPREHENSIVE METABOLIC PANEL
ALT: 49 U/L — ABNORMAL HIGH (ref 0–35)
AST: 51 U/L — ABNORMAL HIGH (ref 0–37)
Albumin: 3.6 g/dL (ref 3.5–5.2)
Alkaline Phosphatase: 52 U/L (ref 39–117)
BUN: 7 mg/dL (ref 6–23)
CO2: 24 mEq/L (ref 19–32)
Calcium: 8.9 mg/dL (ref 8.4–10.5)
Chloride: 102 mEq/L (ref 96–112)
Creatinine, Ser: 0.52 mg/dL (ref 0.50–1.10)
GFR calc Af Amer: >90 mL/min (ref >90)
GFR calc non Af Amer: >90 mL/min (ref >90)
Glucose, Bld: 81 mg/dL (ref 70–99)
Potassium: 5.1 mEq/L (ref 3.5–5.1)
Sodium: 135 mEq/L (ref 135–145)
Total Bilirubin: 0.3 mg/dL (ref 0.3–1.2)
Total Protein: 7.2 g/dL (ref 6.0–8.3)

## 2012-01-09 LAB — POCT I-STAT, CHEM 8
BUN: 8 mg/dL (ref 6–23)
Calcium, Ion: 1.09 mmol/L — ABNORMAL LOW (ref 1.12–1.32)
Chloride: 106 mEq/L (ref 96–112)
Creatinine, Ser: 0.6 mg/dL (ref 0.50–1.10)
Glucose, Bld: 87 mg/dL (ref 70–99)
HCT: 41 % (ref 36.0–46.0)
Hemoglobin: 13.9 g/dL (ref 12.0–15.0)
Potassium: 5.2 mEq/L — ABNORMAL HIGH (ref 3.5–5.1)
Sodium: 138 mEq/L (ref 135–145)
TCO2: 27 mmol/L (ref 0–100)

## 2012-01-09 LAB — CBC
HCT: 39.8 % (ref 36.0–46.0)
Hemoglobin: 13.7 g/dL (ref 12.0–15.0)
MCH: 28 pg (ref 26.0–34.0)
MCHC: 34.4 g/dL (ref 30.0–36.0)
MCV: 81.4 fL (ref 78.0–100.0)
Platelets: 69 10*3/uL — ABNORMAL LOW (ref 150–400)
RBC: 4.89 MIL/uL (ref 3.87–5.11)
RDW: 13.2 % (ref 11.5–15.5)
WBC: 5.9 10*3/uL (ref 4.0–10.5)

## 2012-01-09 LAB — POCT PREGNANCY, URINE: Preg Test, Ur: NEGATIVE

## 2012-01-09 LAB — LIPASE, BLOOD: Lipase: 38 U/L (ref 11–59)

## 2012-01-09 LAB — POCT URINALYSIS DIP (DEVICE)
Bilirubin Urine: NEGATIVE
Glucose, UA: NEGATIVE mg/dL
Hgb urine dipstick: NEGATIVE
Ketones, ur: NEGATIVE mg/dL
Leukocytes, UA: NEGATIVE
Nitrite: NEGATIVE
Protein, ur: NEGATIVE mg/dL
Specific Gravity, Urine: 1.015 (ref 1.005–1.030)
Urobilinogen, UA: 0.2 mg/dL (ref 0.0–1.0)
pH: 6.5 (ref 5.0–8.0)

## 2012-01-09 LAB — DIFFERENTIAL
Basophils Absolute: 0 10*3/uL (ref 0.0–0.1)
Basophils Relative: 0 % (ref 0–1)
Eosinophils Absolute: 0.2 10*3/uL (ref 0.0–0.7)
Eosinophils Relative: 3 % (ref 0–5)
Lymphocytes Relative: 32 % (ref 12–46)
Lymphs Abs: 1.8 10*3/uL (ref 0.7–4.0)
Monocytes Absolute: 0.3 10*3/uL (ref 0.1–1.0)
Monocytes Relative: 6 % (ref 3–12)
Neutro Abs: 3.4 10*3/uL (ref 1.7–7.7)
Neutrophils Relative %: 59 % (ref 43–77)

## 2012-01-09 MED ORDER — ONDANSETRON HCL 4 MG/2ML IJ SOLN
4.0000 mg | Freq: Once | INTRAMUSCULAR | Status: AC
  Administered 2012-01-09: 4 mg via INTRAVENOUS

## 2012-01-09 MED ORDER — MORPHINE SULFATE 4 MG/ML IJ SOLN
4.0000 mg | Freq: Once | INTRAMUSCULAR | Status: AC
  Administered 2012-01-09: 4 mg via INTRAVENOUS
  Filled 2012-01-09: qty 1

## 2012-01-09 MED ORDER — SODIUM CHLORIDE 0.9 % IV SOLN
INTRAVENOUS | Status: DC
  Administered 2012-01-09: 14:00:00 via INTRAVENOUS

## 2012-01-09 MED ORDER — SODIUM CHLORIDE 0.9 % IV SOLN
Freq: Once | INTRAVENOUS | Status: AC
  Administered 2012-01-09: 15:00:00 via INTRAVENOUS

## 2012-01-09 MED ORDER — IOHEXOL 300 MG/ML  SOLN
100.0000 mL | Freq: Once | INTRAMUSCULAR | Status: AC | PRN
  Administered 2012-01-09: 100 mL via INTRAVENOUS

## 2012-01-09 MED ORDER — TRAMADOL HCL 50 MG PO TABS: 50.0000 mg | ORAL_TABLET | Freq: Four times a day (QID) | ORAL | Status: AC | PRN

## 2012-01-09 MED ORDER — ONDANSETRON HCL 4 MG/2ML IJ SOLN
INTRAMUSCULAR | Status: AC
  Administered 2012-01-09: 4 mg via INTRAVENOUS
  Filled 2012-01-09: qty 2

## 2012-01-09 MED ORDER — METOCLOPRAMIDE HCL 10 MG PO TABS: 10.0000 mg | ORAL_TABLET | Freq: Four times a day (QID) | ORAL | Status: AC | PRN

## 2012-01-09 NOTE — ED Notes (Signed)
Pt states she has pain in her lower back radiating to the abdomen that is worse on the right side than the left.  Upon arrival, pt reported pain as  A 10 on the 0-10 scale. Currently pt rates pain as 6.  Pt took Motrin @ 0330 this morning prescribed by her primary care Dr. [name].  Pt went to Primary Care Dr. [name] for back and abdominal pain. Pt reports urine sample was obtained with "clean result". Pt reports blood sample also drawn, results pending. Denies N/V, diarrhea. Denies vaginal symptoms, urinary symptoms, and fever.  Last bowel movement this morning.

## 2012-01-09 NOTE — ED Provider Notes (Signed)
History     CSN: [id number]  Arrival date & time 01/09/12  1155   First MD Initiated Contact with Patient 01/09/12 1325      Chief Complaint  Patient presents with  . Abdominal Pain    (Consider location/radiation/quality/duration/timing/severity/associated sxs/prior treatment) Patient is a 46 y.o. female presenting with abdominal pain. The history is provided by the patient.  Abdominal Pain The primary symptoms of the illness include abdominal pain.  She started having pain across her mid back 3 days ago which was dull and crampy and she thought it may have been a pulled muscle. She saw her doctor started her muscle relaxers which did not help. Last night, he moved from her back to around her mid abdomen and was severe. She rated the pain at 10 out of 10. There is no associated nausea, vomiting, diarrhea and no fever, chills, sweats. She denies any dysuria. She went to [facility] urgent care [facility] where she was noted to have right lower quadrant tenderness and was sent to the emergency department. In the emergency department, she has received morphine and states he is starting to feel some nausea following the morphine. She's never had pain like this before. Current pain is 6/10 following the morphine. Nothing made her pain better nothing made it worse.  Past Medical History  Diagnosis Date  . Anxiety   . GERD (gastroesophageal reflux disease)   . Hyperlipidemia   . Hypertension     Past Surgical History  Procedure Date  . Nasal stenosis repair 2003   . Breast surgery 2004    LUMPECTOMY  . Cesarean section     3  . Tubal ligation   . Abdominal hysterectomy 07-2006  . Carpal tunnel release 4-10    LEFT (Dr. Brynda Greathouse)    Family History  Problem Relation Age of Onset  . Heart attack Mother   . Cancer Mother     lung  . Heart disease Mother   . Hypertension Mother   . Heart attack Father   . Stroke Father   . Heart disease Father   . Hypertension Father   . Diabetes  Maternal Uncle   . Cancer Maternal Grandmother   . Heart failure Maternal Grandfather   . Heart failure Paternal Grandmother     History  Substance Use Topics  . Smoking status: Never Smoker   . Smokeless tobacco: Not on file  . Alcohol Use: Yes    OB History    Grav Para Term Preterm Abortions TAB SAB Ect Mult Living                  Review of Systems  Gastrointestinal: Positive for abdominal pain.  All other systems reviewed and are negative.    Allergies  Penicillins; Atorvastatin; Augmentin; Cefprozil; Demerol; Dilaudid; Meloxicam; and Zithromax  Home Medications   Current Outpatient Rx  Name Route Sig Dispense Refill  . BISOPROLOL FUMARATE 5 MG PO TABS  TAKE 1 TABLET BY MOUTH ONCE A DAY 30 tablet 0    Pt needs appt for next refill  . OMEPRAZOLE-SODIUM BICARBONATE 40-1100 MG PO CAPS Oral Take 2 capsules by mouth daily before breakfast.      . PRAVASTATIN SODIUM 40 MG PO TABS  TAKE 1 TABLET BY MOUTH ONCE A DAY 30 tablet 4  . NYQUIL D COLD/FLU PO Oral Take 2 tablets by mouth at bedtime as needed. For flu symptoms      BP 132/109  Pulse 106  Temp(Src) 98.3  F (36.8 C) (Oral)  Resp 18  Ht 5\' 1"  (1.549 m)  Wt 180 lb (81.647 kg)  BMI 34.01 kg/m2  SpO2 98%  Physical Exam  Nursing note and vitals reviewed.  46 year old female who is resting comfortably in no acute distress. Vital signs are significant for tachycardia with heart rate 106. Oxygen saturation is 80% which is normal. Head is, cephalic and atraumatic. PERRLA, EOMI. There is no scleral icterus. Her Chalmers Guest is clear. Neck is nontender and supple without adenopathy. Lungs are clear without rales, wheezes, or rhonchi. Heart has regular rate and rhythm without murmur. Abdomen is soft, flat with some mild tenderness in the right upper quadrant and moderate to severe tenderness in the right lower quadrant. Right lower quadrant is maximal at McBurney's area. There is no tenderness in the suprapubic area. There is no  CVA tenderness. Peristalsis is diminished. There is no rebound or guarding. Extremities no cyanosis or edema, full range of motion is present. Skin is warm and dry without rash. Neurologic: Mental status is normal, cranial nerves are intact, there no focal motor or sensory deficits.  ED Course  Procedures (including critical care time)  Results for orders placed during the hospital encounter of 01/09/12  CBC      Component Value Range   WBC 5.9  4.0 - 10.5 (K/uL)   RBC 4.89  3.87 - 5.11 (MIL/uL)   Hemoglobin 13.7  12.0 - 15.0 (g/dL)   HCT 16.1  09.6 - 04.5 (%)   MCV 81.4  78.0 - 100.0 (fL)   MCH 28.0  26.0 - 34.0 (pg)   MCHC 34.4  30.0 - 36.0 (g/dL)   RDW 40.9  81.1 - 91.4 (%)   Platelets 69 (*) 150 - 400 (K/uL)  COMPREHENSIVE METABOLIC PANEL      Component Value Range   Sodium 135  135 - 145 (mEq/L)   Potassium 5.1  3.5 - 5.1 (mEq/L)   Chloride 102  96 - 112 (mEq/L)   CO2 24  19 - 32 (mEq/L)   Glucose, Bld 81  70 - 99 (mg/dL)   BUN 7  6 - 23 (mg/dL)   Creatinine, Ser 7.82  0.50 - 1.10 (mg/dL)   Calcium 8.9  8.4 - 95.6 (mg/dL)   Total Protein 7.2  6.0 - 8.3 (g/dL)   Albumin 3.6  3.5 - 5.2 (g/dL)   AST 51 (*) 0 - 37 (U/L)   ALT 49 (*) 0 - 35 (U/L)   Alkaline Phosphatase 52  39 - 117 (U/L)   Total Bilirubin 0.3  0.3 - 1.2 (mg/dL)   GFR calc non Af Amer >90  >90 (mL/min)   GFR calc Af Amer >90  >90 (mL/min)  LIPASE, BLOOD      Component Value Range   Lipase 38  11 - 59 (U/L)  DIFFERENTIAL      Component Value Range   Neutrophils Relative 59  43 - 77 (%)   Neutro Abs 3.4  1.7 - 7.7 (K/uL)   Lymphocytes Relative 32  12 - 46 (%)   Lymphs Abs 1.8  0.7 - 4.0 (K/uL)   Monocytes Relative 6  3 - 12 (%)   Monocytes Absolute 0.3  0.1 - 1.0 (K/uL)   Eosinophils Relative 3  0 - 5 (%)   Eosinophils Absolute 0.2  0.0 - 0.7 (K/uL)   Basophils Relative 0  0 - 1 (%)   Basophils Absolute 0.0  0.0 - 0.1 (K/uL)  POCT PREGNANCY, URINE  Component Value Range   Preg Test, Ur NEGATIVE   NEGATIVE    Ct Abdomen Pelvis W Contrast  01/09/2012  *RADIOLOGY REPORT*  Clinical Data: Abdominal pain  CT ABDOMEN AND PELVIS WITH CONTRAST  Technique:  Multidetector CT imaging of the abdomen and pelvis was performed following the standard protocol during bolus administration of intravenous contrast.  Contrast: OMNIPAQUE IOHEXOL 300 MG/ML IJ SOLN  Comparison: None.  Findings: The lung bases are clear.  There is prominent, diffuse fatty infiltration of the liver.  The gallbladder, spleen, pancreas, adrenal glands, kidneys, urinary bladder have a normal appearance.  There are degenerative changes at the lower lumbar spine. Small, diffuse disc pulled as are present at L4-L5 and L3- L4.  The uterus is surgically absent.  The adnexal regions are unremarkable.  There is mild atherosclerotic calcification within the abdominal aorta.  There is no free fluid or adenopathy within the abdomen or pelvis. Multiple small lymph nodes are present at the mesenteric root which are nonspecific and are within normal limits in size.  No pneumoperitoneum.  The bowel is unremarkable with no evidence of gross inflammation or obstruction.  The appendix is seen in the right lower quadrant and has a normal appearance.  IMPRESSION: Prominent, diffuse fatty infiltration the liver.  Original Report Authenticated By: Brandon Melnick, M.D.   Dg Abd Acute W/chest  01/09/2012  *RADIOLOGY REPORT*  Clinical Data: Right lower quadrant pain.  Nausea, vomiting.  ACUTE ABDOMEN SERIES (ABDOMEN 2 VIEW & CHEST 1 VIEW)  Comparison: None.  Findings: The bowel gas pattern is normal.  There is no evidence of free intraperitoneal air.  No suspicious radio-opaque calculi or other significant radiographic abnormality is seen. Heart size and mediastinal contours are within normal limits.  Both lungs are clear.  IMPRESSION: No acute findings.  Original Report Authenticated By: Cyndie Chime, M.D.   CT shows no evidence of appendicitis. CBC is  suggestive of mesenteric adenitis. Thrombocytopenia is noted of uncertain significance and it is recommended that she have her CBC rechecked in one week.  1. Abdominal pain   2. Mesenteric adenitis   3. Thrombocytopenia       MDM  Right lower quadrant pain suspicious for appendicitis. CT scan has been ordered.        Dione Booze, MD 01/09/12 [phone]

## 2012-01-09 NOTE — Discharge Instructions (Signed)
Return to the emergency department if your symptoms are getting worse.  Your blood platelet count was low today. This should be repeated in one week. No further investigation will be needed unless the platelet count continues to stay low.  Abdominal Pain Abdominal pain can be caused by many things. Your caregiver decides the seriousness of your pain by an examination and possibly blood tests and X-rays. Many cases can be observed and treated at home. Most abdominal pain is not caused by a disease and will probably improve without treatment. However, in many cases, more time must pass before a clear cause of the pain can be found. Before that point, it may not be known if you need more testing, or if hospitalization or surgery is needed. HOME CARE INSTRUCTIONS   Do not take laxatives unless directed by your caregiver.   Take pain medicine only as directed by your caregiver.   Only take over-the-counter or prescription medicines for pain, discomfort, or fever as directed by your caregiver.   Try a clear liquid diet (broth, tea, or water) for as long as directed by your caregiver. Slowly move to a bland diet as tolerated.  SEEK IMMEDIATE MEDICAL CARE IF:   The pain does not go away.   You have a fever.   You keep throwing up (vomiting).   The pain is felt only in portions of the abdomen. Pain in the right side could possibly be appendicitis. In an adult, pain in the left lower portion of the abdomen could be colitis or diverticulitis.   You pass bloody or black tarry stools.  MAKE SURE YOU:   Understand these instructions.   Will watch your condition.   Will get help right away if you are not doing well or get worse.  Document Released: 07/24/2005 Document Revised: 10/03/2011 Document Reviewed: 06/01/2008 [facility] Patient Information 2012 [place], [place].  Mesenteric Adenitis Mesenteric adenitis is an inflammation of lymph nodes (glands) in the abdomen. It may appear to mimic  appendicitis symptoms. It is most common in children. The cause of this may be an infection somewhere else in the body. It usually gets well without treatment but can cause problems for up to a couple weeks. SYMPTOMS  The most common problems are:  Fever.   Abdominal pain and tenderness.   Nausea, vomiting, and/or diarrhea.  DIAGNOSIS  Your caregiver may have an idea what is wrong by examining you or your child. Sometimes lab work and other studies such as Ultrasonography and a CT scan of the abdomen are done.  TREATMENT  Children with mesenteric adenitis will get well without further treatment. Treatment includes rest, pain medications, and fluids. HOME CARE INSTRUCTIONS   Do not take or give laxatives unless ordered by your caregiver.   Use pain medications as directed.   Follow the diet recommended by your caregiver.  SEEK IMMEDIATE MEDICAL CARE IF:   The pain does not go away or becomes severe.   An oral temperature above 102 F (38.9 C) develops.   Repeated vomiting occurs.   The pain becomes localized in the right lower quadrant of the abdomen (possibly appendicitis).   You or your child notice bright red or black tarry stools.  MAKE SURE YOU:   Understand these instructions.   Will watch your condition.   Will get help right away if you are not doing well or get worse.  Document Released: 07/18/2006 Document Revised: 10/03/2011 Document Reviewed: 07/31/2006 [facility] Patient Information 2012 [place], [place].  Tramadol tablets  What is this medicine? TRAMADOL (TRA ma dole) is a pain reliever. It is used to treat moderate to severe pain in adults. This medicine may be used for other purposes; ask your health care provider or pharmacist if you have questions. What should I tell my health care provider before I take this medicine? They need to know if you have any of these conditions: -brain tumor -depression -drug abuse or addiction -head injury -if you  frequently drink alcohol containing drinks -kidney disease or trouble passing urine -liver disease -lung disease, asthma, or breathing problems -seizures or epilepsy -suicidal thoughts, plans, or attempt; a previous suicide attempt by you or a family member -an unusual or allergic reaction to tramadol, codeine, other medicines, foods, dyes, or preservatives -pregnant or trying to get pregnant -breast-feeding How should I use this medicine? Take this medicine by mouth with a full glass of water. Follow the directions on the prescription label. If the medicine upsets your stomach, take it with food or milk. Do not take more medicine than you are told to take. Talk to your pediatrician regarding the use of this medicine in children. Special care may be needed. Overdosage: If you think you have taken too much of this medicine contact a poison control center or emergency room at once. NOTE: This medicine is only for you. Do not share this medicine with others. What if I miss a dose? If you miss a dose, take it as soon as you can. If it is almost time for your next dose, take only that dose. Do not take double or extra doses. What may interact with this medicine? Do not take this medicine with any of the following medications: -MAOIs like Carbex, Eldepryl, Marplan, Nardil, and Parnate This medicine may also interact with the following medications: -alcohol or medicines that contain alcohol -antihistamines -benzodiazepines -bupropion -carbamazepine or oxcarbazepine -clozapine -cyclobenzaprine -digoxin -furazolidone -linezolid -medicines for depression, anxiety, or psychotic disturbances -medicines for migraine headache like almotriptan, eletriptan, frovatriptan, naratriptan, rizatriptan, sumatriptan, zolmitriptan -medicines for pain like pentazocine, buprenorphine, butorphanol, meperidine, nalbuphine, and propoxyphene -medicines for sleep -muscle  relaxants -naltrexone -phenobarbital -phenothiazines like perphenazine, thioridazine, chlorpromazine, mesoridazine, fluphenazine, prochlorperazine, promazine, and trifluoperazine -procarbazine -warfarin This list may not describe all possible interactions. Give your health care provider a list of all the medicines, herbs, non-prescription drugs, or dietary supplements you use. Also tell them if you smoke, drink alcohol, or use illegal drugs. Some items may interact with your medicine. What should I watch for while using this medicine? Tell your doctor or health care professional if your pain does not go away, if it gets worse, or if you have new or a different type of pain. You may develop tolerance to the medicine. Tolerance means that you will need a higher dose of the medicine for pain relief. Tolerance is normal and is expected if you take this medicine for a long time. Do not suddenly stop taking your medicine because you may develop a severe reaction. Your body becomes used to the medicine. This does NOT mean you are addicted. Addiction is a behavior related to getting and using a drug for a non-medical reason. If you have pain, you have a medical reason to take pain medicine. Your doctor will tell you how much medicine to take. If your doctor wants you to stop the medicine, the dose will be slowly lowered over time to avoid any side effects. You may get drowsy or dizzy. Do not drive, use machinery, or do anything that  needs mental alertness until you know how this medicine affects you. Do not stand or sit up quickly, especially if you are an older patient. This reduces the risk of dizzy or fainting spells. Alcohol can increase or decrease the effects of this medicine. Avoid alcoholic drinks. You may have constipation. Try to have a bowel movement at least every 2 to 3 days. If you do not have a bowel movement for 3 days, call your doctor or health care professional. Your mouth may get dry. Chewing  sugarless gum or sucking hard candy, and drinking plenty of water may help. Contact your doctor if the problem does not go away or is severe. What side effects may I notice from receiving this medicine? Side effects that you should report to your doctor or health care professional as soon as possible: -allergic reactions like skin rash, itching or hives, swelling of the face, lips, or tongue -breathing difficulties, wheezing -confusion -itching -light headedness or fainting spells -redness, blistering, peeling or loosening of the skin, including inside the mouth -seizures Side effects that usually do not require medical attention (report to your doctor or health care professional if they continue or are bothersome): -constipation -dizziness -drowsiness -headache -nausea, vomiting This list may not describe all possible side effects. Call your doctor for medical advice about side effects. You may report side effects to FDA at 1-800-FDA-1088. Where should I keep my medicine? Keep out of the reach of children. Store at room temperature between 15 and 30 degrees C (59 and 86 degrees F). Keep container tightly closed. Throw away any unused medicine after the expiration date. NOTE: This sheet is a summary. It may not cover all possible information. If you have questions about this medicine, talk to your doctor, pharmacist, or health care provider.  2012, [employer] Standard. (06/27/2010 11:55:44 AM)  Metoclopramide tablets What is this medicine? METOCLOPRAMIDE (met oh kloe PRA mide) is used to treat the symptoms of gastroesophageal reflux disease (GERD) like heartburn. It is also used to treat people with slow emptying of the stomach and intestinal tract. This medicine may be used for other purposes; ask your health care provider or pharmacist if you have questions. What should I tell my health care provider before I take this medicine? They need to know if you have any of these  conditions: -breast cancer -depression -diabetes -heart failure -high blood pressure -kidney disease -liver disease -Parkinson's disease or a movement disorder -pheochromocytoma -seizures -stomach obstruction, bleeding, or perforation -an unusual or allergic reaction to metoclopramide, procainamide, sulfites, other medicines, foods, dyes, or preservatives -pregnant or trying to get pregnant -breast-feeding How should I use this medicine? Take this medicine by mouth with a glass of water. Follow the directions on the prescription label. Take this medicine on an empty stomach, about 30 minutes before eating. Take your doses at regular intervals. Do not take your medicine more often than directed. Do not stop taking except on the advice of your doctor or health care professional. A special MedGuide will be given to you by the pharmacist with each prescription and refill. Be sure to read this information carefully each time. Talk to your pediatrician regarding the use of this medicine in children. Special care may be needed. Overdosage: If you think you have taken too much of this medicine contact a poison control center or emergency room at once. NOTE: This medicine is only for you. Do not share this medicine with others. What if I miss a dose? If you miss a  dose, take it as soon as you can. If it is almost time for your next dose, take only that dose. Do not take double or extra doses. What may interact with this medicine? -acetaminophen -cyclosporine -digoxin -medicines for blood pressure -medicines for diabetes, including insulin -medicines for hay fever and other allergies -medicines for depression, especially an Monoamine Oxidase Inhibitor (MAOI) -medicines for Parkinson's disease, like levodopa -medicines for sleep or for pain -tetracycline This list may not describe all possible interactions. Give your health care provider a list of all the medicines, herbs, non-prescription  drugs, or dietary supplements you use. Also tell them if you smoke, drink alcohol, or use illegal drugs. Some items may interact with your medicine. What should I watch for while using this medicine? It may take a few weeks for your stomach condition to start to get better. However, do not take this medicine for longer than 12 weeks. The longer you take this medicine, and the more you take it, the greater your chances are of developing serious side effects. If you are an elderly patient, a female patient, or you have diabetes, you may be at an increased risk for side effects from this medicine. Contact your doctor immediately if you start having movements you cannot control such as lip smacking, rapid movements of the tongue, involuntary or uncontrollable movements of the eyes, head, arms and legs, or muscle twitches and spasms. Patients and their families should watch out for worsening depression or thoughts of suicide. Also watch out for any sudden or severe changes in feelings such as feeling anxious, agitated, panicky, irritable, hostile, aggressive, impulsive, severely restless, overly excited and hyperactive, or not being able to sleep. If this happens, especially at the beginning of treatment or after a change in dose, call your doctor. Do not treat yourself for high fever. Ask your doctor or health care professional for advice. You may get drowsy or dizzy. Do not drive, use machinery, or do anything that needs mental alertness until you know how this drug affects you. Do not stand or sit up quickly, especially if you are an older patient. This reduces the risk of dizzy or fainting spells. Alcohol can make you more drowsy and dizzy. Avoid alcoholic drinks. What side effects may I notice from receiving this medicine? Side effects that you should report to your doctor or health care professional as soon as possible: -allergic reactions like skin rash, itching or hives, swelling of the face, lips, or  tongue -abnormal production of milk in females -breast enlargement in both males and females -change in the way you walk -difficulty moving, speaking or swallowing -drooling, lip smacking, or rapid movements of the tongue -excessive sweating -fever -involuntary or uncontrollable movements of the eyes, head, arms and legs -irregular heartbeat or palpitations -muscle twitches and spasms -unusually weak or tired Side effects that usually do not require medical attention (report to your doctor or health care professional if they continue or are bothersome): -change in sex drive or performance -depressed mood -diarrhea -difficulty sleeping -headache -menstrual changes -restless or nervous This list may not describe all possible side effects. Call your doctor for medical advice about side effects. You may report side effects to FDA at 1-800-FDA-1088. Where should I keep my medicine? Keep out of the reach of children. Store at room temperature between 20 and 25 degrees C (68 and 77 degrees F). Protect from light. Keep container tightly closed. Throw away any unused medicine after the expiration date. NOTE: This sheet is  a summary. It may not cover all possible information. If you have questions about this medicine, talk to your doctor, pharmacist, or health care provider.  2012, [employer]vier/Gold Standard. (06/08/2008 4:30:05 PM)

## 2012-01-09 NOTE — ED Notes (Signed)
thisw RN with 2 unsuccessful IV attempts

## 2012-01-09 NOTE — ED Notes (Signed)
MD at bedside. 

## 2012-01-09 NOTE — Telephone Encounter (Signed)
Triage Record Num: [id number] Operator: Di Kindle Patient Name: Devita Nies Call Date & Time: 01/09/2012 8:31:08AM Patient Phone: [phone] PCP: Tillman Abide Patient Gender: Female PCP Fax : [phone] Patient DOB: Aug 07, 1966 Practice Name: [facility] Reason for Call: Caller: Laporchia/Spouse; PCP: Juleen China.; CB#: [phone]; Call regarding Having Abdominal Pain On Right and Left Side; onset 0330, nausea, afebrile, below rib cage, with abd swelling, back pain. Guideine: ABD Pain, with concerns, unable to schedule appt in EPIC within 4 hrs, caller to office: Aram Beecham, who advises of appt, or may go to UC, pt decides she is going to UC now. OFFICE: PT going to UC Protocol(s) Used: Abdominal Pain Recommended Outcome per Protocol: See Provider within 4 hours Reason for Outcome: New onset of jaundice New onset constant mild or aching lower abdominal pain Care Advice: ~ Pain medication or laxatives should not be taken until symptoms are evaluated. Call provider immediately if pain gets worse, localized to one spot or lasts more than 4 hours and prevents normal activity; tell provider if vomiting begins after onset of pain or if having any fever. ~ Call provider immediately if develop severe pain, black, tarry stools, bloody stools, blood-streaked or coffee ground-looking vomitus, or abdomen swollen. ~ 01/09/2012 8:44:02AM Page 1 of 1 [id number]

## 2012-01-09 NOTE — ED Notes (Signed)
Pt drinking CT contrast at this time.

## 2012-01-09 NOTE — ED Provider Notes (Signed)
History     CSN: [id number]  Arrival date & time 01/09/12  1155   First MD Initiated Contact with Patient 01/09/12 1325      Chief Complaint  Patient presents with  . Abdominal Pain    (Consider location/radiation/quality/duration/timing/severity/associated sxs/prior treatment) HPI  Past Medical History  Diagnosis Date  . Anxiety   . GERD (gastroesophageal reflux disease)   . Hyperlipidemia   . Hypertension     Past Surgical History  Procedure Date  . Nasal stenosis repair 2003   . Breast surgery 2004    LUMPECTOMY  . Cesarean section     3  . Tubal ligation   . Abdominal hysterectomy 07-2006  . Carpal tunnel release 4-10    LEFT (Dr. Brynda Greathouse)    Family History  Problem Relation Age of Onset  . Heart attack Mother   . Cancer Mother     lung  . Heart disease Mother   . Hypertension Mother   . Heart attack Father   . Stroke Father   . Heart disease Father   . Hypertension Father   . Diabetes Maternal Uncle   . Cancer Maternal Grandmother   . Heart failure Maternal Grandfather   . Heart failure Paternal Grandmother     History  Substance Use Topics  . Smoking status: Never Smoker   . Smokeless tobacco: Not on file  . Alcohol Use: Yes    OB History    Grav Para Term Preterm Abortions TAB SAB Ect Mult Living                  Review of Systems  Allergies  Penicillins; Atorvastatin; Augmentin; Cefprozil; Demerol; Dilaudid; Meloxicam; and Zithromax  Home Medications   Current Outpatient Rx  Name Route Sig Dispense Refill  . BISOPROLOL FUMARATE 5 MG PO TABS  TAKE 1 TABLET BY MOUTH ONCE A DAY 30 tablet 0    Pt needs appt for next refill  . OMEPRAZOLE-SODIUM BICARBONATE 40-1100 MG PO CAPS Oral Take 2 capsules by mouth daily before breakfast.      . PRAVASTATIN SODIUM 40 MG PO TABS  TAKE 1 TABLET BY MOUTH ONCE A DAY 30 tablet 4  . NYQUIL D COLD/FLU PO Oral Take 2 tablets by mouth at bedtime as needed. For flu symptoms      BP 138/81  Pulse 91   Temp(Src) 98.3 F (36.8 C) (Oral)  Resp 18  Ht 5\' 1"  (1.549 m)  Wt 180 lb (81.647 kg)  BMI 34.01 kg/m2  SpO2 97%  Physical Exam  ED Course  Procedures (including critical care time)   Labs Reviewed  CBC   No results found.   No diagnosis found.    MDM  Complains of low back pain radiating to low abdomen in a bandlike fashion onset 4 days ago. Pain is constant however she has intermittent episodes of severe sharp pain lasting a few minutes at a time. Admits to intermittent nausea. None now admits to diminished appetite. On exam, alert no distress lungs clear to auscultation heart regular rate and rhythm abdomen positive bowel sounds morbidly obese mild diffuse tenderness no guarding        Doug Sou, MD 01/09/12 1340

## 2012-01-09 NOTE — ED Provider Notes (Addendum)
History     CSN: [id number]  Arrival date & time 01/09/12  4098   First MD Initiated Contact with Patient 01/09/12 1000      Chief Complaint  Patient presents with  . Back Pain  . Abdominal Pain    (Consider location/radiation/quality/duration/timing/severity/associated sxs/prior treatment) HPI Comments: Patient is a pleasant 46 year old lady that presents urgent care complaining of at this point mainly lower and periumbilical pain for the last 48 hours. Patient reports that Monday started with some back pain it was unclear if it was related to her back and she feels she has not strain or perform any physical activities to explain her pain and it didn't feel different than previously expressed back pains  Patient describes that bite Tuesday night pain started radiating towards both sides of her abdomen mainly describes spontaneous discomfort on her right lower abdominal region. Have experienced intermittent nausea and sensation of abdominal distention, last temperature was Tuesday recorded to 101 , Patient was recently diagnosed with a respiratory condition (bronchitis) also for upper respiratory symptoms and congestion has partially resolved she has been in communication were primary care Dr. which today lwas instructed to consider coming to urgent care for further evaluation as her condition has changed.  Patient is a 45 y.o. female presenting with back pain and abdominal pain. The history is provided by the patient and a relative.  Back Pain  This is a new problem. The current episode started more than 2 days ago. The problem occurs constantly. The problem has been gradually worsening. The pain is present in the lumbar spine. The pain is at a severity of 8/10. The pain is moderate. The symptoms are aggravated by bending and certain positions. Associated symptoms include a fever and abdominal pain. Pertinent negatives include no headaches, no bowel incontinence, no perianal numbness, no  bladder incontinence, no dysuria, no paresthesias, no tingling and no weakness. The treatment provided no relief.  Abdominal Pain The primary symptoms of the illness include abdominal pain, fever and nausea. The primary symptoms of the illness do not include fatigue, vomiting, diarrhea, dysuria or vaginal discharge.  Additional symptoms associated with the illness include back pain. Symptoms associated with the illness do not include diaphoresis, constipation, urgency or frequency.    Past Medical History  Diagnosis Date  . Anxiety   . GERD (gastroesophageal reflux disease)   . Hyperlipidemia   . Hypertension     Past Surgical History  Procedure Date  . Nasal stenosis repair 2003   . Breast surgery 2004    LUMPECTOMY  . Cesarean section     3  . Tubal ligation   . Abdominal hysterectomy 07-2006  . Carpal tunnel release 4-10    LEFT (Dr. Brynda Greathouse)    Family History  Problem Relation Age of Onset  . Heart attack Mother   . Cancer Mother     lung  . Heart disease Mother   . Hypertension Mother   . Heart attack Father   . Stroke Father   . Heart disease Father   . Hypertension Father   . Diabetes Maternal Uncle   . Cancer Maternal Grandmother   . Heart failure Maternal Grandfather   . Heart failure Paternal Grandmother     History  Substance Use Topics  . Smoking status: Never Smoker   . Smokeless tobacco: Not on file  . Alcohol Use: Yes    OB History    Grav Para Term Preterm Abortions TAB SAB Ect Mult Living  Review of Systems  Constitutional: Positive for fever and appetite change. Negative for diaphoresis and fatigue.  HENT: Positive for congestion.   Gastrointestinal: Positive for nausea and abdominal pain. Negative for vomiting, diarrhea, constipation, blood in stool, anal bleeding, rectal pain and bowel incontinence.  Genitourinary: Negative for bladder incontinence, dysuria, urgency, frequency, flank pain, vaginal discharge and vaginal  pain.  Musculoskeletal: Positive for back pain.  Skin: Negative for color change.  Neurological: Negative for tingling, weakness, headaches and paresthesias.    Allergies  Atorvastatin; Augmentin; Cefprozil; Demerol; Dilaudid; Meloxicam; Penicillins; and Zithromax  Home Medications   Current Outpatient Rx  Name Route Sig Dispense Refill  . BISOPROLOL FUMARATE 5 MG PO TABS  TAKE 1 TABLET BY MOUTH ONCE A DAY 30 tablet 0    Pt needs appt for next refill  . CYCLOBENZAPRINE HCL 10 MG PO TABS Oral Take 1 tablet (10 mg total) by mouth 3 (three) times daily as needed for muscle spasms. 45 tablet 2  . LORAZEPAM 1 MG PO TABS Oral Take 0.5 tablets (0.5 mg total) by mouth 2 (two) times daily as needed for anxiety. 30 tablet 0    For panic spells  . OMEPRAZOLE-SODIUM BICARBONATE 40-1100 MG PO CAPS Oral Take 2 capsules by mouth daily before breakfast.      . PRAVASTATIN SODIUM 40 MG PO TABS  TAKE 1 TABLET BY MOUTH ONCE A DAY 30 tablet 4  . POLYMYXIN B-TRIMETHOPRIM 10000-0.1 UNIT/ML-% OP SOLN Left Eye Place 1 drop into the left eye every 4 (four) hours. 10 mL 0    BP 135/89  Pulse 97  Temp(Src) 98.2 F (36.8 C) (Oral)  Resp 16  SpO2 98%  Physical Exam  Nursing note and vitals reviewed. Constitutional: She appears well-developed and well-nourished.  Eyes: Conjunctivae are normal.  Pulmonary/Chest: No respiratory distress.  Abdominal: Soft. She exhibits no distension and no mass. There is no hepatomegaly. There is tenderness in the right lower quadrant. There is tenderness at McBurney's point. There is no rigidity, no rebound and no guarding.      ED Course  Procedures (including critical care time)   Labs Reviewed  POCT URINALYSIS DIP (DEVICE)   No results found.   No diagnosis found.    MDM  Patient presents urgent care with back and abdominal pain fourth day of evolution. Patient reports fevers and mild nausea at home exam concerning for right lower quadrant pain. Patient was  transferred to the emergency department for further evaluation. Recommendation status to rule out acute abdominal conditions.  Have reviewed labs performed by PCP office yesterday with remarkable for a normal urinalysis and a unremarkable CBC with differential      Jimmie Molly, MD 01/09/12 1150  Jimmie Molly, MD 01/09/12 1151

## 2012-01-09 NOTE — ED Notes (Signed)
Pt completed CT contrast.

## 2012-01-09 NOTE — ED Notes (Signed)
Pt reports onset bilateral flank and RLQ abd pain onset Monday. Sent from [facility] for further evaluation. Pt has been nauseated and loose stool.

## 2012-01-14 ENCOUNTER — Ambulatory Visit (INDEPENDENT_AMBULATORY_CARE_PROVIDER_SITE_OTHER): Payer: Managed Care, Other (non HMO) | Admitting: Internal Medicine

## 2012-01-14 ENCOUNTER — Encounter: Payer: Self-pay | Admitting: Internal Medicine

## 2012-01-14 VITALS — BP 112/80 | HR 72 | Temp 98.2°F | Ht 61.0 in | Wt 191.0 lb

## 2012-01-14 DIAGNOSIS — K7689 Other specified diseases of liver: Secondary | ICD-10-CM

## 2012-01-14 DIAGNOSIS — K760 Fatty (change of) liver, not elsewhere classified: Secondary | ICD-10-CM | POA: Insufficient documentation

## 2012-01-14 LAB — HEPATIC FUNCTION PANEL
ALT: 209 U/L — ABNORMAL HIGH (ref 0–35)
AST: 63 U/L — ABNORMAL HIGH (ref 0–37)
Albumin: 4.1 g/dL (ref 3.5–5.2)
Alkaline Phosphatase: 50 U/L (ref 39–117)
Bilirubin, Direct: 0 mg/dL (ref 0.0–0.3)
Total Bilirubin: 0.3 mg/dL (ref 0.3–1.2)
Total Protein: 7.5 g/dL (ref 6.0–8.3)

## 2012-01-14 LAB — CBC WITH DIFFERENTIAL/PLATELET
Basophils Absolute: 0.1 10*3/uL (ref 0.0–0.1)
Basophils Relative: 0.7 % (ref 0.0–3.0)
Eosinophils Absolute: 0.3 10*3/uL (ref 0.0–0.7)
Eosinophils Relative: 4.4 % (ref 0.0–5.0)
HCT: 41.3 % (ref 36.0–46.0)
Hemoglobin: 13.9 g/dL (ref 12.0–15.0)
Lymphocytes Relative: 36.4 % (ref 12.0–46.0)
Lymphs Abs: 2.7 10*3/uL (ref 0.7–4.0)
MCHC: 33.6 g/dL (ref 30.0–36.0)
MCV: 83.2 fl (ref 78.0–100.0)
Monocytes Absolute: 0.5 10*3/uL (ref 0.1–1.0)
Monocytes Relative: 6.5 % (ref 3.0–12.0)
Neutro Abs: 3.9 10*3/uL (ref 1.4–7.7)
Neutrophils Relative %: 52 % (ref 43.0–77.0)
Platelets: 188 10*3/uL (ref 150.0–400.0)
RBC: 4.97 Mil/uL (ref 3.87–5.11)
RDW: 13.3 % (ref 11.5–14.6)
WBC: 7.5 10*3/uL (ref 4.5–10.5)

## 2012-01-14 NOTE — Progress Notes (Signed)
Subjective:    Patient ID: Yvonne Stephenson, female    DOB: 1966/05/29, 46 y.o.   MRN: [id number]  HPI Hasn't felt well for a month or so Had had some back pain--saw Dr Patsy Lager Then the pain worsened but was in her stomach Tender on right but pain across back and abdomen Worried about her gallbladder Fatty infiltration found in liver Mild increased LFTs Discussed mesenteric adenitis but within normal limits on CT scan Platelet count was low  Very little alcohol Occ takes tylenol PM--once to twice a week. Rare other acetaminophen No FH of liver problems  Feels better now Appetite is fine Weight fluctuates---up a little now Bowels are fine. SLightly green when sick--normal now No nausea or vomiting  Current Outpatient Prescriptions on File Prior to Visit  Medication Sig Dispense Refill  . bisoprolol (ZEBETA) 5 MG tablet TAKE 1 TABLET BY MOUTH ONCE A DAY  30 tablet  0  . metoCLOPramide (REGLAN) 10 MG tablet Take 1 tablet (10 mg total) by mouth every 6 (six) hours as needed (nausea).  30 tablet  0  . omeprazole-sodium bicarbonate (ZEGERID) 40-1100 MG per capsule Take 2 capsules by mouth daily before breakfast.        . pravastatin (PRAVACHOL) 40 MG tablet TAKE 1 TABLET BY MOUTH ONCE A DAY  30 tablet  4  . Pseudoeph-Doxylamine-DM-APAP (NYQUIL D COLD/FLU PO) Take 2 tablets by mouth at bedtime as needed. For flu symptoms      . traMADol (ULTRAM) 50 MG tablet Take 1 tablet (50 mg total) by mouth every 6 (six) hours as needed for pain.  15 tablet  0    Allergies  Allergen Reactions  . Penicillins Shortness Of Breath    REACTION: difficulty breathing  . Atorvastatin     REACTION: hip pain  . Augmentin Swelling    Tongue swelling  . Cefprozil     REACTION: rash  . Demerol Nausea And Vomiting  . Dilaudid (Hydromorphone Hcl) Nausea And Vomiting  . Meloxicam (Mobic) Swelling    Face swelling and lip tingling  . Zithromax (Azithromycin)     REACTION: tongue felt funny    Past  Medical History  Diagnosis Date  . Anxiety   . GERD (gastroesophageal reflux disease)   . Hyperlipidemia   . Hypertension     Past Surgical History  Procedure Date  . Nasal stenosis repair 2003   . Breast surgery 2004    LUMPECTOMY  . Cesarean section     3  . Tubal ligation   . Abdominal hysterectomy 07-2006  . Carpal tunnel release 4-10    LEFT (Dr. Brynda Greathouse)    Family History  Problem Relation Age of Onset  . Heart attack Mother   . Cancer Mother     lung  . Heart disease Mother   . Hypertension Mother   . Heart attack Father   . Stroke Father   . Heart disease Father   . Hypertension Father   . Diabetes Maternal Uncle   . Cancer Maternal Grandmother   . Heart failure Maternal Grandfather   . Heart failure Paternal Grandmother     History   Social History  . Marital Status: Married    Spouse Name: N/A    Number of Children: 3  . 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 Smoking status: Never Smoker   . Smokeless  tobacco: Never Used  . Alcohol Use: Yes  . Drug Use: No  . Sexually Active: No   Other Topics Concern  . Not on file   Social History Narrative   Does walk a little for exercise   Review of Systems Mild cough for 1 month or so Slight whistling or wheezing noted No SOB though Fever last week---gone now No recent travel Son had strep 1 month ago or so---no other illness exposures    Objective:   Physical Exam  Constitutional: She appears well-developed and well-nourished. No distress.  Neck: Normal range of motion. Neck supple.  Cardiovascular: Normal rate, regular rhythm and normal heart sounds.  Exam reveals no gallop.   No murmur heard. Pulmonary/Chest: Effort normal and breath sounds normal. No respiratory distress. She has no wheezes. She has no rales.  Abdominal: Soft. Bowel sounds are normal. She exhibits no distension and no mass. There is no tenderness.  There is no rebound and no guarding.       No HSM  Musculoskeletal: She exhibits no edema and no tenderness.  Lymphadenopathy:    She has no cervical adenopathy.  Psychiatric: She has a normal mood and affect. Her behavior is normal. Judgment and thought content normal.          Assessment & Plan:

## 2012-01-14 NOTE — Assessment & Plan Note (Signed)
Appears to have had an acute illness that is now better Not sure if she just has steatosis or inflammation as well Platelets were low and mild transaminase elevation Will recheck these and hepatitis profile Consider GI referral (and consider liver biopsy)

## 2012-01-15 LAB — HEPATITIS PANEL, ACUTE
HCV Ab: NEGATIVE
Hep A IgM: NEGATIVE
Hep B C IgM: NEGATIVE
Hepatitis B Surface Ag: NEGATIVE

## 2012-01-16 ENCOUNTER — Other Ambulatory Visit: Payer: Self-pay | Admitting: Internal Medicine

## 2012-01-17 ENCOUNTER — Telehealth: Payer: Self-pay

## 2012-01-17 NOTE — Telephone Encounter (Signed)
Pt left v/m that she was calling for chest results and wanted detailed message left on cell phone because pt is at work cell# [phone]. ( I did not see note in pts chart on chest results).

## 2012-01-17 NOTE — Telephone Encounter (Signed)
Pt was wanting her lab results, which I left detailed message on her cell phone per her request.

## 2012-03-12 ENCOUNTER — Ambulatory Visit (INDEPENDENT_AMBULATORY_CARE_PROVIDER_SITE_OTHER): Payer: Managed Care, Other (non HMO) | Admitting: Internal Medicine

## 2012-03-12 ENCOUNTER — Encounter: Payer: Self-pay | Admitting: Internal Medicine

## 2012-03-12 ENCOUNTER — Other Ambulatory Visit: Payer: Self-pay | Admitting: Internal Medicine

## 2012-03-12 ENCOUNTER — Encounter: Payer: Self-pay | Admitting: *Deleted

## 2012-03-12 ENCOUNTER — Telehealth: Payer: Self-pay

## 2012-03-12 ENCOUNTER — Telehealth: Payer: Self-pay | Admitting: Internal Medicine

## 2012-03-12 VITALS — BP 122/88 | HR 73 | Temp 98.0°F | Wt 187.0 lb

## 2012-03-12 DIAGNOSIS — R10813 Right lower quadrant abdominal tenderness: Secondary | ICD-10-CM | POA: Insufficient documentation

## 2012-03-12 LAB — CBC WITH DIFFERENTIAL/PLATELET
Basophils Absolute: 0 10*3/uL (ref 0.0–0.1)
Basophils Relative: 0.3 % (ref 0.0–3.0)
Eosinophils Absolute: 0.2 10*3/uL (ref 0.0–0.7)
Eosinophils Relative: 1.7 % (ref 0.0–5.0)
HCT: 42.3 % (ref 36.0–46.0)
Hemoglobin: 14.1 g/dL (ref 12.0–15.0)
Lymphocytes Relative: 31.3 % (ref 12.0–46.0)
Lymphs Abs: 3.3 10*3/uL (ref 0.7–4.0)
MCHC: 33.3 g/dL (ref 30.0–36.0)
MCV: 83.4 fl (ref 78.0–100.0)
Monocytes Absolute: 0.5 10*3/uL (ref 0.1–1.0)
Monocytes Relative: 4.8 % (ref 3.0–12.0)
Neutro Abs: 6.5 10*3/uL (ref 1.4–7.7)
Neutrophils Relative %: 61.9 % (ref 43.0–77.0)
Platelets: 204 10*3/uL (ref 150.0–400.0)
RBC: 5.07 Mil/uL (ref 3.87–5.11)
RDW: 13.7 % (ref 11.5–14.6)
WBC: 10.6 10*3/uL — ABNORMAL HIGH (ref 4.5–10.5)

## 2012-03-12 LAB — BASIC METABOLIC PANEL
BUN: 10 mg/dL (ref 6–23)
CO2: 28 mEq/L (ref 19–32)
Calcium: 9.4 mg/dL (ref 8.4–10.5)
Chloride: 100 mEq/L (ref 96–112)
Creatinine, Ser: 0.8 mg/dL (ref 0.4–1.2)
GFR: 88.28 mL/min (ref >60.00)
Glucose, Bld: 88 mg/dL (ref 70–99)
Potassium: 4.3 mEq/L (ref 3.5–5.1)
Sodium: 136 mEq/L (ref 135–145)

## 2012-03-12 LAB — HEPATIC FUNCTION PANEL
ALT: 58 U/L — ABNORMAL HIGH (ref 0–35)
AST: 37 U/L (ref 0–37)
Albumin: 4.1 g/dL (ref 3.5–5.2)
Alkaline Phosphatase: 52 U/L (ref 39–117)
Bilirubin, Direct: 0 mg/dL (ref 0.0–0.3)
Total Bilirubin: 0.4 mg/dL (ref 0.3–1.2)
Total Protein: 7.6 g/dL (ref 6.0–8.3)

## 2012-03-12 NOTE — Telephone Encounter (Signed)
Caller: Hadasa/Patient; PCP: Tillman Abide I.; CB#: [phone]; ; ; Call regarding Abdominal Pain;  Pt is having another occurance of abdominal pain since Tuesday 03/10/12. Pt is having intermittent pain. Pt is bent over /can't straighten. Abdominal pain is a 6/10.  She is nauseted /no vomiting. Pt is calling to see what she should do today. She had a similar episode on 01/09/12 where she had a swollen abdomen with abdominal pain and ended up in the ED/[place] for probably appendiciits but it was not. Pt states she was dx with  swollen abdominal lymph nodes and saw MD several days later. She has had no episodes since.  Pt is afeb. Pt started with abdominal pain and swelling on tuesday5/14/13 and has continued. RN checked EPIC which indicated possible [facility] referral. Rn called back line to speak with Rena who advised Rn to send note to MD now (since appts were full).

## 2012-03-12 NOTE — Telephone Encounter (Signed)
Yvonne Stephenson with [facility] said 03/10/12 abdominal swelling began, today abdominal swelling as if pt 8 months pregnant with rt lower abdominal pain; hurts even to touch abdomen.Pt is nauseated but no fever and no vomiting. Pt had similar problem 12/2011. Pt seen 01/14/12 with fatty infiltration of liver also possible GI referral. [name] said not sure if Dr Alphonsus Sias could start before 2 pm today and advised to send phone note to Dr Alphonsus Sias. Yvonne Stephenson said she would send phone note.

## 2012-03-12 NOTE — Assessment & Plan Note (Signed)
Recurrent episodes of abdominal pain, nausea and bloating Could be gallbladder dysfunction though CT showed no abnormalities---could consider ultrasound or HIDA ?intestinal Will set up GI eval  Bloating and area of tenderness means ovary should be considered Will check pelvic ultrasound and proceed with GI eval if nothing found Recheck labs

## 2012-03-12 NOTE — Progress Notes (Signed)
Subjective:    Patient ID: Yvonne Stephenson, female    DOB: 06-Jun-1966, 46 y.o.   MRN: [id number]  HPI Very nauseated yesterday---med helped Stomach feels bloated--"like I am 5 months pregnant" RLQ pain in past 2 days---somewhat less tender today but bloating is bad  Appetite is gone---gets really full feeling when that happens More pain with movement as well  Bowels are frequent----still normal for her No blood Appeared a little more yellow than typical  Current Outpatient Prescriptions on File Prior to Visit  Medication Sig Dispense Refill  . bisoprolol (ZEBETA) 5 MG tablet TAKE 1 TABLET BY MOUTH ONCE A DAY  90 tablet  0  . omeprazole-sodium bicarbonate (ZEGERID) 40-1100 MG per capsule Take 2 capsules by mouth daily before breakfast.          Allergies  Allergen Reactions  . Penicillins Shortness Of Breath    REACTION: difficulty breathing  . Amoxicillin-Pot Clavulanate Swelling    Tongue swelling  . Atorvastatin     REACTION: hip pain  . Cefprozil     REACTION: rash  . Demerol Nausea And Vomiting  . Dilaudid (Hydromorphone Hcl) Nausea And Vomiting  . Meloxicam (Meloxicam) Swelling    Face swelling and lip tingling  . Zithromax (Azithromycin)     REACTION: tongue felt funny    Past Medical History  Diagnosis Date  . Anxiety   . GERD (gastroesophageal reflux disease)   . Hyperlipidemia   . Hypertension     Past Surgical History  Procedure Date  . Nasal stenosis repair 2003   . Breast surgery 2004    LUMPECTOMY  . Cesarean section     3  . Tubal ligation   . Abdominal hysterectomy 07-2006  . Carpal tunnel release 4-10    LEFT (Dr. Brynda Greathouse)    Family History  Problem Relation Age of Onset  . Heart attack Mother   . Cancer Mother     lung  . Heart disease Mother   . Hypertension Mother   . Heart attack Father   . Stroke Father   . Heart disease Father   . Hypertension Father   . Diabetes Maternal Uncle   . Cancer Maternal Grandmother   . Heart  failure Maternal Grandfather   . Heart failure Paternal Grandmother     History   Social History  . Marital Status: Married    Spouse Name: N/A    Number of Children: 3  . 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 Smoking status: Never Smoker   . Smokeless tobacco: Never Used  . Alcohol Use: Yes  . Drug Use: No  . Sexually Active: No   Other Topics Concern  . Not on file   Social History Narrative   Does walk a little for exercise   Review of Systems No fever No cough or SOB----did have cold last week    Objective:   Physical Exam  Constitutional: She appears well-developed and well-nourished. No distress.  Neck: Normal range of motion.  Cardiovascular: Normal rate, regular rhythm and normal heart sounds.  Exam reveals no gallop.   No murmur heard. Pulmonary/Chest: Effort normal and breath sounds normal. No respiratory distress. She has no wheezes. She has no rales.  Abdominal: Soft. Bowel sounds are normal. She exhibits distension. She exhibits no mass. There is tenderness. There is no rebound and no guarding.  Mild distention but not tympanitic Mild RLQ tenderness No RUQ tenderness  Musculoskeletal: She exhibits no edema.  Lymphadenopathy:    She has no cervical adenopathy.  Psychiatric: She has a normal mood and affect. Her behavior is normal.          Assessment & Plan:

## 2012-03-12 NOTE — Telephone Encounter (Signed)
Okay to add on  Discussed with Lyla Son

## 2012-03-12 NOTE — Telephone Encounter (Signed)
Patient notified and added on to schedule @ 1:45 today.

## 2012-03-12 NOTE — Telephone Encounter (Signed)
Will see this afternoon at 1:30 or 1:45

## 2012-03-13 ENCOUNTER — Ambulatory Visit
Admission: RE | Admit: 2012-03-13 | Discharge: 2012-03-13 | Disposition: A | Discharge status: Home / Self Care | Payer: Managed Care, Other (non HMO) | Source: Ambulatory Visit | Attending: Internal Medicine | Admitting: Internal Medicine

## 2012-03-13 DIAGNOSIS — R10813 Right lower quadrant abdominal tenderness: Secondary | ICD-10-CM

## 2012-03-16 ENCOUNTER — Encounter: Payer: Self-pay | Admitting: *Deleted

## 2012-03-16 ENCOUNTER — Encounter: Payer: Self-pay | Admitting: Family Medicine

## 2012-03-16 ENCOUNTER — Ambulatory Visit (INDEPENDENT_AMBULATORY_CARE_PROVIDER_SITE_OTHER): Payer: Managed Care, Other (non HMO) | Admitting: Family Medicine

## 2012-03-16 DIAGNOSIS — N83209 Unspecified ovarian cyst, unspecified side: Secondary | ICD-10-CM

## 2012-03-16 NOTE — Progress Notes (Signed)
Patient saw primary care for pelvic pain, bloating, they thought it was her appendix, ct showed normal but lymph nodes around appendix were swollen.  Pelvic u/s showed that she had a "bleeding ovary."

## 2012-03-16 NOTE — Patient Instructions (Signed)
Diagnostic Laparoscopy Laparoscopy is a surgical procedure. It is used to diagnose and treat diseases inside the belly(abdomen). It is usually a brief, common, and relatively simple procedure. The laparoscopeis a thin, lighted, pencil-sized instrument. It is like a telescope. It is inserted into your abdomen through a small cut (incision). Your caregiver can look at the organs inside your body through this instrument. He or she can see if there is anything abnormal. Laparoscopy can be done either in a hospital or outpatient clinic. You may be given a mild sedative to help you relax before the procedure. Once in the operating room, you will be given a drug to make you sleep (general anesthesia). Laparoscopy usually lasts less than 1 hour. After the procedure, you will be monitored in a recovery area until you are stable and doing well. Once you are home, it will take 2 to 3 days to fully recover. RISKS AND COMPLICATIONS  Laparoscopy has relatively few risks. Your caregiver will discuss the risks with you before the procedure. Some problems that can occur include:  Infection.   Bleeding.   Damage to other organs.   Anesthetic side effects.  PROCEDURE Once you receive anesthesia, your surgeon inflates the abdomen with a harmless gas (carbon dioxide). This makes the organs easier to see. The laparoscope is inserted into the abdomen through a small incision. This allows your surgeon to see into the abdomen. Other small instruments are also inserted into the abdomen through other small openings. Many surgeons attach a video camera to the laparoscope to enlarge the view. During a diagnostic laparoscopy, the surgeon may be looking for inflammation, infection, or cancer. Your surgeon may take tissue samples(biopsies). The samples are sent to a specialist in looking at cells and tissue samples (pathologist). The pathologist examines them under a microscope. Biopsies can help to diagnose or confirm a  disease. AFTER THE PROCEDURE   The gas is released from inside the abdomen.   The incisions are closed with stitches (sutures). Because these incisions are small (usually less than 1/2 inch), there is usually minimal discomfort after the procedure. There may be some mild discomfort in the throat. This is from the tube placed in the throat while you were sleeping. You may have some mild abdominal discomfort. There may also be discomfort from the instrument placement incisions in the abdomen.   The recovery time is shortened as long as there are no complications.   You will rest in a recovery room until stable and doing well. As long as there are no complications, you may be allowed to go home.  FINDING OUT THE RESULTS OF YOUR TEST Not all test results are available during your visit. If your test results are not back during the visit, make an appointment with your caregiver to find out the results. Do not assume everything is normal if you have not heard from your caregiver or the medical facility. It is important for you to follow up on all of your test results. HOME CARE INSTRUCTIONS   Take all medicines as directed.   Only take over-the-counter or prescription medicines for pain, discomfort, or fever as directed by your caregiver.   Resume daily activities as directed.   Showers are preferred over baths.   You may resume sexual activities in 1 week or as directed.   Do not drive while taking narcotics.  SEEK MEDICAL CARE IF:   There is increasing abdominal pain.   There is new pain in the shoulders (shoulder strap areas).     You feel lightheaded or faint.   You have the chills.   You or your child has an oral temperature above 102 F (38.9 C).   There is pus-like (purulent) drainage from any of the wounds.   You are unable to pass gas or have a bowel movement.   You feel sick to your stomach (nauseous) or throw up (vomit).  MAKE SURE YOU:   Understand these instructions.    Will watch your condition.   Will get help right away if you are not doing well or get worse.  Document Released: 01/20/2001 Document Revised: 10/03/2011 Document Reviewed: 10/14/2007 Exit[employer] Patient Information 2012 [employer]. 

## 2012-03-16 NOTE — Progress Notes (Signed)
Sounds good

## 2012-03-17 NOTE — Progress Notes (Signed)
  Subjective:    Patient ID: Yvonne Stephenson, female    DOB: August 31, 1966, 46 y.o.   MRN: [id number]  HPI  Pt. Reports 2 episodes of debilitating abdominal pain with nausea.  Had CT which failed to show any abnormality.  She then underwent pelvic sonogram which showed right complex cyst measuring 4.3 x 3.5 x 3.6.  It appears hemorrhagic.  She has a h/o TVH by Dr. Mia Creek and 3 prior C-sections.  Review of Systems  Constitutional: Negative for fever and fatigue.  Gastrointestinal: Positive for nausea. Negative for vomiting, diarrhea and abdominal distention.  Genitourinary: Negative for dysuria.  Musculoskeletal: Negative for myalgias.  Skin: Negative for rash.  Neurological: Negative for headaches.       Objective:   Physical Exam  Vitals reviewed. Constitutional: She is oriented to person, place, and time. She appears well-developed and well-nourished.  HENT:  Head: Normocephalic and atraumatic.  Eyes: No scleral icterus.  Neck: Neck supple.  Cardiovascular: Normal rate.   Pulmonary/Chest: Effort normal.  Abdominal: There is no tenderness. There is no guarding.  Neurological: She is alert and oriented to person, place, and time.  Skin: Skin is warm and dry.          Assessment & Plan:  Ovarian Cyst--lengthy discussion had about treatment and watchful waiting.  There does not seem to be anything worrisome about the features.  Pt. Has had 2 episodes in 2 months, and would like it removed.  We discussed her age, likelihood of issues with left ovary, possibly removing that one, menopause, risks of surgery.  Pt. Desires to move ahead with definitive treatment, but does not want the left ovary removed if it looks well.  Will book hopefully before 6/10, her sons graduation.

## 2012-04-01 ENCOUNTER — Inpatient Hospital Stay (HOSPITAL_COMMUNITY)
Admission: RE | Admit: 2012-04-01 | Discharge: 2012-04-01 | Payer: Managed Care, Other (non HMO) | Source: Ambulatory Visit

## 2012-04-01 NOTE — Patient Instructions (Signed)
   Your procedure is scheduled on: Thursday June 13th  Enter through the Main Entrance of [facility] at: 11:30am Pick up the phone at the desk and dial [phone] and inform us of your arrival.  Please call this number if you have any problems the morning of surgery: [phone]  Remember: Do not eat food after midnight: Wednesday Do not drink clear liquids after: 9am Thursday Take these medicines the morning of surgery with a SIP OF WATER:  Do not wear jewelry, make-up, or FINGER nail polish Do not wear lotions, powders, perfumes or deodorant. Do not shave 48 hours prior to surgery. Do not bring valuables to the hospital. Contacts, dentures or bridgework may not be worn into surgery.  Leave suitcase in the car. After Surgery it may be brought to your room. For patients being admitted to the hospital, checkout time is 11:00am the day of discharge.  Patients discharged on the day of surgery will not be allowed to drive home.     Remember to use your hibiclens as instructed.Please shower with 1/2 bottle the evening before your surgery and the other 1/2 bottle the morning of surgery. Neck down avoiding private area.

## 2012-04-02 ENCOUNTER — Telehealth: Payer: Self-pay

## 2012-04-02 ENCOUNTER — Encounter (HOSPITAL_COMMUNITY): Payer: Self-pay

## 2012-04-02 ENCOUNTER — Encounter (HOSPITAL_COMMUNITY)
Admission: RE | Admit: 2012-04-02 | Discharge: 2012-04-02 | Disposition: A | Discharge status: Home / Self Care | Payer: Managed Care, Other (non HMO) | Source: Ambulatory Visit | Attending: Family Medicine | Admitting: Family Medicine

## 2012-04-02 HISTORY — DX: Unspecified asthma, uncomplicated: J45.909

## 2012-04-02 HISTORY — DX: Other specified postprocedural states: R11.2

## 2012-04-02 HISTORY — DX: Other specified postprocedural states: Z98.890

## 2012-04-02 HISTORY — DX: Nausea with vomiting, unspecified: R11.2

## 2012-04-02 LAB — CBC
HCT: 45.7 % (ref 36.0–46.0)
Hemoglobin: 15.1 g/dL — ABNORMAL HIGH (ref 12.0–15.0)
MCH: 27.8 pg (ref 26.0–34.0)
MCHC: 33 g/dL (ref 30.0–36.0)
MCV: 84 fL (ref 78.0–100.0)
Platelets: 136 10*3/uL — ABNORMAL LOW (ref 150–400)
RBC: 5.44 MIL/uL — ABNORMAL HIGH (ref 3.87–5.11)
RDW: 13.5 % (ref 11.5–15.5)
WBC: 7.6 10*3/uL (ref 4.0–10.5)

## 2012-04-02 LAB — SURGICAL PCR SCREEN
MRSA, PCR: NEGATIVE
Staphylococcus aureus: POSITIVE — AB

## 2012-04-02 NOTE — Pre-Procedure Instructions (Signed)
Platelets 136 on lab draw at pat visit-last check 03/12/12-platelets 204-notified Dr [facility] stat [facility], notified t/s office-[place].

## 2012-04-02 NOTE — Patient Instructions (Addendum)
   Your procedure is scheduled on: Thursday June 13th  Enter through the [employer] of [facility] at: 11"30am Pick up the phone at the desk and dial [phone] and inform us of your arrival.  Please call this number if you have any problems the morning of surgery: [phone]  Remember: Do not eat food after midnight: Wednesday Do not drink clear liquids after: 9am Thursday then nothing Take these medicines the morning of surgery with a SIP OF WATER: morning medications.  Do not wear jewelry, make-up, or FINGER nail polish Do not wear lotions, powders, perfumes or deodorant. Do not shave 48 hours prior to surgery. Do not bring valuables to the hospital. Contacts, dentures or bridgework may not be worn into surgery.   Patients discharged on the day of surgery will not be allowed to drive home.     Remember to use your hibiclens as instructed.Please shower with 1/2 bottle the evening before your surgery and the other 1/2 bottle the morning of surgery. Neck down avoiding private area.

## 2012-04-09 ENCOUNTER — Inpatient Hospital Stay (HOSPITAL_COMMUNITY): Payer: Managed Care, Other (non HMO)

## 2012-04-09 ENCOUNTER — Encounter (HOSPITAL_COMMUNITY): Payer: Self-pay

## 2012-04-09 ENCOUNTER — Encounter (HOSPITAL_COMMUNITY)
Admission: RE | Disposition: A | Discharge status: Home / Self Care | Payer: Self-pay | Source: Ambulatory Visit | Attending: Family Medicine

## 2012-04-09 ENCOUNTER — Ambulatory Visit (HOSPITAL_COMMUNITY)
Admission: RE | Admit: 2012-04-09 | Discharge: 2012-04-09 | Disposition: A | Discharge status: Home / Self Care | Payer: Managed Care, Other (non HMO) | Source: Ambulatory Visit | Attending: Family Medicine | Admitting: Family Medicine

## 2012-04-09 DIAGNOSIS — N83201 Unspecified ovarian cyst, right side: Secondary | ICD-10-CM | POA: Diagnosis present

## 2012-04-09 DIAGNOSIS — N83209 Unspecified ovarian cyst, unspecified side: Secondary | ICD-10-CM

## 2012-04-09 DIAGNOSIS — Z9889 Other specified postprocedural states: Secondary | ICD-10-CM

## 2012-04-09 DIAGNOSIS — Z01812 Encounter for preprocedural laboratory examination: Secondary | ICD-10-CM | POA: Insufficient documentation

## 2012-04-09 DIAGNOSIS — Z01818 Encounter for other preprocedural examination: Secondary | ICD-10-CM | POA: Insufficient documentation

## 2012-04-09 DIAGNOSIS — N949 Unspecified condition associated with female genital organs and menstrual cycle: Secondary | ICD-10-CM | POA: Insufficient documentation

## 2012-04-09 HISTORY — PX: LAPAROSCOPY: SHX197

## 2012-04-09 HISTORY — PX: SALPINGOOPHORECTOMY: SHX82

## 2012-04-09 LAB — CBC
HCT: 40.9 % (ref 36.0–46.0)
Hemoglobin: 13.5 g/dL (ref 12.0–15.0)
MCH: 27.1 pg (ref 26.0–34.0)
MCHC: 33 g/dL (ref 30.0–36.0)
MCV: 82.1 fL (ref 78.0–100.0)
Platelets: 179 10*3/uL (ref 150–400)
RBC: 4.98 MIL/uL (ref 3.87–5.11)
RDW: 13.4 % (ref 11.5–15.5)
WBC: 8.4 10*3/uL (ref 4.0–10.5)

## 2012-04-09 SURGERY — LAPAROSCOPY OPERATIVE
Anesthesia: General | Site: Abdomen | Laterality: Right | Wound class: Clean Contaminated

## 2012-04-09 MED ORDER — KETOROLAC TROMETHAMINE 30 MG/ML IJ SOLN
INTRAMUSCULAR | Status: DC | PRN
  Administered 2012-04-09: 30 mg via INTRAVENOUS

## 2012-04-09 MED ORDER — FENTANYL CITRATE 0.05 MG/ML IJ SOLN: 25.0000 ug | INTRAMUSCULAR | Status: DC | PRN

## 2012-04-09 MED ORDER — ROCURONIUM BROMIDE 100 MG/10ML IV SOLN
INTRAVENOUS | Status: DC | PRN
  Administered 2012-04-09: 10 mg via INTRAVENOUS
  Administered 2012-04-09: 40 mg via INTRAVENOUS

## 2012-04-09 MED ORDER — OXYCODONE-ACETAMINOPHEN 5-325 MG PO TABS: 1.0000 | ORAL_TABLET | Freq: Four times a day (QID) | ORAL | Status: AC | PRN

## 2012-04-09 MED ORDER — SCOPOLAMINE 1 MG/3DAYS TD PT72
MEDICATED_PATCH | TRANSDERMAL | Status: AC
  Filled 2012-04-09: qty 1

## 2012-04-09 MED ORDER — NEOSTIGMINE METHYLSULFATE 1 MG/ML IJ SOLN
INTRAMUSCULAR | Status: DC | PRN
  Administered 2012-04-09: 3 mg via INTRAVENOUS

## 2012-04-09 MED ORDER — LACTATED RINGERS IV SOLN
INTRAVENOUS | Status: DC
  Administered 2012-04-09 (×2): via INTRAVENOUS

## 2012-04-09 MED ORDER — DEXAMETHASONE SODIUM PHOSPHATE 10 MG/ML IJ SOLN
INTRAMUSCULAR | Status: AC
  Filled 2012-04-09: qty 1

## 2012-04-09 MED ORDER — BUPIVACAINE HCL (PF) 0.25 % IJ SOLN
INTRAMUSCULAR | Status: AC
  Filled 2012-04-09: qty 30

## 2012-04-09 MED ORDER — DEXAMETHASONE SODIUM PHOSPHATE 10 MG/ML IJ SOLN
INTRAMUSCULAR | Status: DC | PRN
  Administered 2012-04-09: 10 mg via INTRAVENOUS

## 2012-04-09 MED ORDER — EPHEDRINE 5 MG/ML INJ
INTRAVENOUS | Status: AC
  Filled 2012-04-09: qty 10

## 2012-04-09 MED ORDER — OXYCODONE-ACETAMINOPHEN 5-325 MG PO TABS
1.0000 | ORAL_TABLET | ORAL | Status: DC | PRN
  Administered 2012-04-09: 1 via ORAL

## 2012-04-09 MED ORDER — MIDAZOLAM HCL 2 MG/2ML IJ SOLN
INTRAMUSCULAR | Status: AC
  Filled 2012-04-09: qty 2

## 2012-04-09 MED ORDER — NEOSTIGMINE METHYLSULFATE 1 MG/ML IJ SOLN
INTRAMUSCULAR | Status: AC
  Filled 2012-04-09: qty 10

## 2012-04-09 MED ORDER — OXYCODONE-ACETAMINOPHEN 5-325 MG PO TABS
ORAL_TABLET | ORAL | Status: AC
  Filled 2012-04-09: qty 1

## 2012-04-09 MED ORDER — ONDANSETRON HCL 4 MG/2ML IJ SOLN
INTRAMUSCULAR | Status: AC
  Filled 2012-04-09: qty 2

## 2012-04-09 MED ORDER — PROPOFOL 10 MG/ML IV EMUL
INTRAVENOUS | Status: AC
  Filled 2012-04-09: qty 20

## 2012-04-09 MED ORDER — FENTANYL CITRATE 0.05 MG/ML IJ SOLN
INTRAMUSCULAR | Status: AC
  Filled 2012-04-09: qty 5

## 2012-04-09 MED ORDER — PROMETHAZINE HCL 25 MG/ML IJ SOLN: 6.2500 mg | INTRAMUSCULAR | Status: DC | PRN

## 2012-04-09 MED ORDER — SCOPOLAMINE 1 MG/3DAYS TD PT72
1.0000 | MEDICATED_PATCH | Freq: Once | TRANSDERMAL | Status: DC
  Administered 2012-04-09: 1.5 mg via TRANSDERMAL

## 2012-04-09 MED ORDER — GLYCOPYRROLATE 0.2 MG/ML IJ SOLN
INTRAMUSCULAR | Status: DC | PRN
  Administered 2012-04-09: .6 mg via INTRAVENOUS

## 2012-04-09 MED ORDER — LIDOCAINE HCL (CARDIAC) 20 MG/ML IV SOLN
INTRAVENOUS | Status: AC
  Filled 2012-04-09: qty 5

## 2012-04-09 MED ORDER — LACTATED RINGERS IR SOLN
Status: DC | PRN
  Administered 2012-04-09: 3000 mL

## 2012-04-09 MED ORDER — LIDOCAINE HCL (CARDIAC) 20 MG/ML IV SOLN
INTRAVENOUS | Status: DC | PRN
  Administered 2012-04-09: 60 mg via INTRAVENOUS

## 2012-04-09 MED ORDER — MIDAZOLAM HCL 5 MG/5ML IJ SOLN
INTRAMUSCULAR | Status: DC | PRN
  Administered 2012-04-09: 2 mg via INTRAVENOUS

## 2012-04-09 MED ORDER — BUPIVACAINE HCL (PF) 0.25 % IJ SOLN
INTRAMUSCULAR | Status: DC | PRN
  Administered 2012-04-09: 7 mL

## 2012-04-09 MED ORDER — ACETAMINOPHEN 10 MG/ML IV SOLN
1000.0000 mg | Freq: Once | INTRAVENOUS | Status: AC
  Administered 2012-04-09: 1000 mg via INTRAVENOUS
  Filled 2012-04-09: qty 100

## 2012-04-09 MED ORDER — KETOROLAC TROMETHAMINE 30 MG/ML IJ SOLN: 15.0000 mg | Freq: Once | INTRAMUSCULAR | Status: DC | PRN

## 2012-04-09 MED ORDER — ROCURONIUM BROMIDE 50 MG/5ML IV SOLN
INTRAVENOUS | Status: AC
  Filled 2012-04-09: qty 1

## 2012-04-09 MED ORDER — FENTANYL CITRATE 0.05 MG/ML IJ SOLN
INTRAMUSCULAR | Status: DC | PRN
  Administered 2012-04-09 (×5): 50 ug via INTRAVENOUS

## 2012-04-09 MED ORDER — EPHEDRINE SULFATE 50 MG/ML IJ SOLN
INTRAMUSCULAR | Status: DC | PRN
  Administered 2012-04-09 (×2): 5 mg via INTRAVENOUS

## 2012-04-09 MED ORDER — GLYCOPYRROLATE 0.2 MG/ML IJ SOLN
INTRAMUSCULAR | Status: AC
  Filled 2012-04-09: qty 2

## 2012-04-09 MED ORDER — PROPOFOL 10 MG/ML IV EMUL
INTRAVENOUS | Status: DC | PRN
  Administered 2012-04-09: 150 mg via INTRAVENOUS
  Administered 2012-04-09: 50 mg via INTRAVENOUS

## 2012-04-09 MED ORDER — ONDANSETRON HCL 4 MG/2ML IJ SOLN
INTRAMUSCULAR | Status: DC | PRN
  Administered 2012-04-09: 4 mg via INTRAVENOUS

## 2012-04-09 SURGICAL SUPPLY — 32 items
ADH SKN CLS APL DERMABOND .7 (GAUZE/BANDAGES/DRESSINGS) ×1
CABLE HIGH FREQUENCY MONO STRZ (ELECTRODE) IMPLANT
CATH ROBINSON RED A/P 16FR (CATHETERS) IMPLANT
CHLORAPREP W/TINT 26ML (MISCELLANEOUS) ×3 IMPLANT
CLOTH BEACON ORANGE TIMEOUT ST (SAFETY) ×3 IMPLANT
DERMABOND ADVANCED (GAUZE/BANDAGES/DRESSINGS) ×1
DERMABOND ADVANCED .7 DNX12 (GAUZE/BANDAGES/DRESSINGS) ×2 IMPLANT
FORCEPS CUTTING 33CM 5MM (CUTTING FORCEPS) ×3 IMPLANT
FORCEPS CUTTING 45CM 5MM (CUTTING FORCEPS) IMPLANT
GLOVE BIOGEL PI IND STRL 7.0 (GLOVE) ×2 IMPLANT
GLOVE BIOGEL PI INDICATOR 7.0 (GLOVE) ×1
GLOVE ECLIPSE 7.0 STRL STRAW (GLOVE) ×9 IMPLANT
GOWN PREVENTION PLUS LG XLONG (DISPOSABLE) ×6 IMPLANT
GOWN PREVENTION PLUS XLARGE (GOWN DISPOSABLE) ×6 IMPLANT
HEMOSTAT SURGICEL 2X3 (HEMOSTASIS) ×3 IMPLANT
NS IRRIG 1000ML POUR BTL (IV SOLUTION) IMPLANT
PACK LAPAROSCOPY BASIN (CUSTOM PROCEDURE TRAY) ×3 IMPLANT
POUCH SPECIMEN RETRIEVAL 10MM (ENDOMECHANICALS) ×3 IMPLANT
PROTECTOR NERVE ULNAR (MISCELLANEOUS) ×3 IMPLANT
SET IRRIG TUBING LAPAROSCOPIC (IRRIGATION / IRRIGATOR) ×3 IMPLANT
SLEEVE Z-THREAD 5X100MM (TROCAR) ×3 IMPLANT
SOLUTION ELECTROLUBE (MISCELLANEOUS) ×3 IMPLANT
SUT VIC AB 3-0 X1 27 (SUTURE) ×3 IMPLANT
SUT VICRYL 0 UR6 27IN ABS (SUTURE) ×6 IMPLANT
SUT VICRYL 4-0 PS2 18IN ABS (SUTURE) ×3 IMPLANT
TOWEL OR 17X24 6PK STRL BLUE (TOWEL DISPOSABLE) ×6 IMPLANT
TRAY FOLEY CATH 14FR (SET/KITS/TRAYS/PACK) ×3 IMPLANT
TROCAR BALLN 12MMX100 BLUNT (TROCAR) ×3 IMPLANT
TROCAR Z-THREAD BLADED 11X100M (TROCAR) IMPLANT
TROCAR Z-THREAD BLADED 5X100MM (TROCAR) ×3 IMPLANT
WARMER LAPAROSCOPE (MISCELLANEOUS) ×3 IMPLANT
WATER STERILE IRR 1000ML POUR (IV SOLUTION) IMPLANT

## 2012-04-09 NOTE — Op Note (Signed)
Preoperative diagnosis: Pelvic pain   Postoperative diagnosis: Same  Procedure: Diagnostic laparoscopy  Surgeon: Tinnie Gens, MD  Anesthesia: GETT-Cassidy  Findings: Right ovary with small cyst adherent to pelvic side wall.  Omental adhesions to anterior pelvis.  Estimated blood loss: Minimal  Complications: None known  Specimens: None  Reason for procedure: 46 y.o.G4P0013 who has several month h/o recurrent ov. Cyst who desired definitive treatment after alternatives explained to pt.  Procedure: Patient was taken to the operating room was placed in dorsal lithotomy in Palo stirrups. She was prepped and draped in the usual sterile fashion. A timeout was performed. The patient had SCDs  in place. Foley catheter is used to drain bladder. Sponge stick with Ray-tec placed in vagina.   Attention was then turned to the abdomen. Four cc of 0.25% Marcaine was injected at the umbilicus. Two Allis clamps were used to tent up the skin of the umbilicus a vertical one half centimeter incision was made here. The fascia was incised with the knife  And the peritoneum was entered sharply with this incision. Two edges of the fascia were tagged with a 0 Vicryl suture on a UR 6 needle. A Hassan trocar was placed through this incision and a pneumoperitoneum was created. The patient was then placed in Trendelenburg. A survey of the patient's pelvis and abdomen revealed the findings above.   Bilateral 5-mm lower quadrant ports  were then placed under direct visualization.   On the right side, The omentum was stuck to the anterior peritoneum.  A gyrus was used to take this down sequentially.  The right tube was easily visualized and the ovary was stuck to the pelvic side wall.  Blunt dissection was done to partially remove the ovary.  The right infundibulopelvic ligament was also clamped and transected with the Gyrus. More blunt dissection was used to get most of the ovary and tube removed.  Final adhesions taken with  the Gyrus.   Surgi-cell placed over pedicle.  Pneumoperitoneum let down and no significant bleeding noted. The specimen was then removed from the abdomen through the 11-mm port using an Endocatch bag, under direct visualization.  The operative site was surveyed, and it was found to be hemostatic.   No intraoperative injury to other surrounding organs was noted.  The abdomen was desufflated and all instruments were then removed from the patient's abdomen. The fascial incision of the umbilicus was closed with a 0 Vicryl figure of eight stitch.  All skin incisions were closed with 3-0 Vicryl subcuticular stitches and Dermabond.   The patient will be discharged to home as per PACU criteria.  Routine postoperative instructions given.  She was prescribed Percocet.  She will follow up in the clinic in 2 wks for postoperative evaluation .

## 2012-04-09 NOTE — H&P (Signed)
Yvonne Stephenson is an 46 y.o. [phone] Unknown female.   Chief Complaint: Recurrent ovarian cysts  HPI: 46 y.o.G4P0013 with previous hysterectomy who has had recurrent ovarian cysts on her right side which have resolved.  She desires definitive treatment and surgical removal of this ovary.    Past Medical History  Diagnosis Date  . Anxiety   . GERD (gastroesophageal reflux disease)   . Hyperlipidemia   . Hypertension   . Asthma     only when gets a cold-expired inhaler  . PONV (postoperative nausea and vomiting)     Past Surgical History  Procedure Date  . Nasal stenosis repair 2003   . Breast surgery 2004    LUMPECTOMY  . Cesarean section     3  . Tubal ligation   . Abdominal hysterectomy 07-2006  . Carpal tunnel release 4-10    LEFT (Dr. Brynda Greathouse)    Family History  Problem Relation Age of Onset  . Heart attack Mother   . Cancer Mother     lung  . Heart disease Mother   . Hypertension Mother   . Stroke Mother   . Heart attack Father   . Heart disease Father   . Hypertension Father   . Diabetes Maternal Uncle   . Cancer Maternal Grandmother   . Heart failure Maternal Grandfather   . Heart failure Paternal Grandmother    Social History:  reports that she has never smoked. She has never used smokeless tobacco. She reports that she drinks alcohol. She reports that she does not use illicit drugs.  Allergies:  Allergies  Allergen Reactions  . Penicillins Shortness Of Breath    REACTION: difficulty breathing  . Amoxicillin-Pot Clavulanate Swelling    Tongue swelling  . Atorvastatin     REACTION: hip pain  . Cefprozil     REACTION: rash  . Demerol Nausea And Vomiting  . Dilaudid (Hydromorphone Hcl) Nausea And Vomiting  . Meloxicam (Meloxicam) Swelling    Face swelling and lip tingling  . Zithromax (Azithromycin)     REACTION: tongue felt funny    Medications Prior to Admission  Medication Sig Dispense Refill  . ibuprofen (ADVIL,MOTRIN) 200 MG tablet Take  200 mg by mouth every 6 (six) hours as needed. Takes for pain      . omeprazole-sodium bicarbonate (ZEGERID) 40-1100 MG per capsule Take 2 capsules by mouth 2 (two) times daily.       . bisoprolol (ZEBETA) 5 MG tablet TAKE 1 TABLET BY MOUTH ONCE A DAY  90 tablet  0     A comprehensive review of systems was negative.  Blood pressure 127/77, pulse 66, temperature 98.2 F (36.8 C), temperature source Oral, resp. rate 18, SpO2 99.00%. BP 127/77  Pulse 66  Temp 98.2 F (36.8 C) (Oral)  Resp 18  SpO2 99% General appearance: alert, cooperative and appears stated age Eyes: negative findings: scleera without icterus Neck: no adenopathy, supple, symmetrical, trachea midline and thyroid not enlarged, symmetric, no tenderness/mass/nodules Lungs: clear to auscultation bilaterally Heart: regular rate and rhythm, S1, S2 normal, no murmur, click, rub or gallop Abdomen: soft, non-tender; bowel sounds normal; no masses,  no organomegaly Extremities: extremities normal, atraumatic, no cyanosis or edema Skin: Skin color, texture, turgor normal. No rashes or lesions Neurologic: Grossly normal   Lab Results  Component Value Date   WBC 8.4 04/09/2012   HGB 13.5 04/09/2012   HCT 40.9 04/09/2012   MCV 82.1 04/09/2012   PLT 179 04/09/2012  Lab Results  Component Value Date   PREGTESTUR NEGATIVE 01/09/2012     Assessment/Plan Patient Active Problem List  Diagnosis  . HYPERLIPIDEMIA  . ANXIETY  . HYPERTENSION  . GERD  . Anxiety attack  . Fatty infiltration of liver  . RLQ abdominal tenderness   Laparoscopic RSO. Risks include but are not limited to bleeding, infection, injury to surrounding structures, including bowel, bladder and ureters, blood clots, and death.  Likelihood of success is high. Will only remove left ovary if necessary.   Amberle Lyter S 04/09/2012, 12:37 PM

## 2012-04-09 NOTE — Transfer of Care (Signed)
Immediate Anesthesia Transfer of Care Note  Patient: Yvonne Stephenson  Procedure(s) Performed: Procedure(s) (LRB): LAPAROSCOPY OPERATIVE (N/A) SALPINGO OOPHERECTOMY (Right)  Patient Location: PACU  Anesthesia Type: General  Level of Consciousness: awake, alert  and oriented  Airway & Oxygen Therapy: Patient Spontanous Breathing and Patient connected to nasal cannula oxygen  Post-op Assessment: Report given to PACU RN and Post -op Vital signs reviewed and stable  Post vital signs: Reviewed and stable  Complications: No apparent anesthesia complications

## 2012-04-09 NOTE — Anesthesia Procedure Notes (Signed)
Procedure Name: Intubation Date/Time: 04/09/2012 1:09 PM Performed by: Graciela Husbands Pre-anesthesia Checklist: Patient identified, Timeout performed, Emergency Drugs available, Suction available and Patient being monitored Patient Re-evaluated:Patient Re-evaluated prior to inductionOxygen Delivery Method: Circle system utilized Preoxygenation: Pre-oxygenation with 100% oxygen Intubation Type: IV induction Ventilation: Mask ventilation without difficulty Laryngoscope Size: 3 (GLide Scope Blade) Grade View: Grade I Tube type: Oral Tube size: 7.0 mm Number of attempts: 1 Airway Equipment and Method: Patient positioned with wedge pillow,  Stylet and Video-laryngoscopy Placement Confirmation: ETT inserted through vocal cords under direct vision,  breath sounds checked- equal and bilateral and positive ETCO2 Secured at: 20 cm Tube secured with: Tape Dental Injury: Teeth and Oropharynx as per pre-operative assessment  Difficulty Due To: Difficult Airway- due to anterior larynx, Difficult Airway- due to limited oral opening and Difficulty was anticipated

## 2012-04-09 NOTE — Discharge Instructions (Signed)
Laparoscopic Ovarian Removal Surgery Care After Refer to this sheet in the next few weeks. These instructions provide you with information on caring for yourself after your procedure. Your caregiver may also give you more specific instructions. Your treatment has been planned according to current medical practices, but problems sometimes occur. Call your caregiver if you have any problems or questions after your procedure. HOME CARE INSTRUCTIONS  Take any medicine as directed by your caregiver. Follow the directions carefully.   Check your incisions every day.   Keep the incision area(s) dry. Ask your caregiver when it is safe to shower or bathe again.   Rest as much as possible for the next 3 days. Ask your caregiver when it is safe to go back to your normal activities.   Drink enough fluids to keep your urine clear or pale yellow.   Keep all follow-up appointments. Your caregiver will make sure you are healing the way you should be.  SEEK MEDICAL CARE IF:   You have bleeding or discharge from your vagina.   You have pain in your abdomen.   You feel nauseous.  SEEK IMMEDIATE MEDICAL CARE IF:   Your incision(s) becomes red, swollen, or tender.   Your incision(s) start(s) bleeding.   You have pus coming from any incision.   You have heavy or persistent vaginal bleeding or discharge.   You have severe or increased abdominal pain.   You cannot stop vomiting.   Your nausea will not go away.   You have a fever.  MAKE SURE YOU:  Understand these instructions.   Watch your condition.   Get help right away if you are not doing well or get worse.  Document Released: 10/03/2011 Document Reviewed: 10/01/2011 [employer] Patient Information 2012 [employer]. 

## 2012-04-09 NOTE — Anesthesia Preprocedure Evaluation (Signed)
Anesthesia Evaluation  Patient identified by MRN, date of birth, ID band Patient awake    Reviewed: Allergy & Precautions, H&P , NPO status , Patient's Chart, lab work & pertinent test results, reviewed documented beta blocker date and time   History of Anesthesia Complications (+) PONV  Airway Mallampati: II TM Distance: >3 FB Neck ROM: full    Dental  (+) Teeth Intact   Pulmonary asthma (bronchial asthma with colds) ,  breath sounds clear to auscultation  Pulmonary exam normal       Cardiovascular Exercise Tolerance: Good hypertension (well controlled), On Home Beta Blockers Rhythm:regular Rate:Normal  hyperlipidemia   Neuro/Psych negative neurological ROS  negative psych ROS   GI/Hepatic Neg liver ROS, GERD-  Medicated and Controlled,  Endo/Other  negative endocrine ROS  Renal/GU negative Renal ROS  Female GU complaint     Musculoskeletal   Abdominal   Peds  Hematology negative hematology ROS (+)   Anesthesia Other Findings Severe N/V with demerol and dilaudid.  Plan to try fentanyl instead. Meloxicam caused facial swelling, no problems with ibuprofen or aleve.  Will try toradol.  Reproductive/Obstetrics negative OB ROS                           Anesthesia Physical Anesthesia Plan  ASA: III  Anesthesia Plan: General ETT   Post-op Pain Management:    Induction:   Airway Management Planned:   Additional Equipment:   Intra-op Plan:   Post-operative Plan:   Informed Consent: I have reviewed the patients History and Physical, chart, labs and discussed the procedure including the risks, benefits and alternatives for the proposed anesthesia with the patient or authorized representative who has indicated his/her understanding and acceptance.   Dental Advisory Given  Plan Discussed with: CRNA and Surgeon  Anesthesia Plan Comments:         Anesthesia Quick Evaluation

## 2012-04-10 ENCOUNTER — Encounter (HOSPITAL_COMMUNITY): Payer: Self-pay | Admitting: Family Medicine

## 2012-04-10 NOTE — Anesthesia Postprocedure Evaluation (Signed)
  Anesthesia Post-op Note  Patient: Yvonne Stephenson  Procedure(s) Performed: Procedure(s) (LRB): LAPAROSCOPY OPERATIVE (N/A) SALPINGO OOPHERECTOMY (Right)  Patient is awake and responsive. Pain and nausea are reasonably well controlled. Vital signs are stable and clinically acceptable. Oxygen saturation is clinically acceptable. There are no apparent anesthetic complications at this time. Patient is ready for discharge.

## 2012-04-28 ENCOUNTER — Encounter: Payer: Self-pay | Admitting: Family Medicine

## 2012-04-28 ENCOUNTER — Ambulatory Visit (INDEPENDENT_AMBULATORY_CARE_PROVIDER_SITE_OTHER): Payer: Managed Care, Other (non HMO) | Admitting: Family Medicine

## 2012-04-28 VITALS — BP 130/81 | HR 70 | Ht 61.0 in | Wt 190.0 lb

## 2012-04-28 DIAGNOSIS — Z09 Encounter for follow-up examination after completed treatment for conditions other than malignant neoplasm: Secondary | ICD-10-CM

## 2012-04-28 NOTE — Progress Notes (Signed)
  Subjective:    Patient ID: Yvonne Stephenson, female    DOB: [birth year], 46 y.o.   MRN: [id number]  HPI  Returns today following laparoscopic removal of her right ovary. She were still having some pain with bending and pain in her umbilicus. Otherwise she feels like she is doing well. She denies fevers, chills, nausea, vomiting, diarrhea, or constipation  Review of Systems  Constitutional: Negative for fever and chills.  Gastrointestinal: Positive for abdominal pain. Negative for nausea, vomiting, diarrhea and constipation.       Objective:   Physical Exam  Constitutional: She appears well-developed and well-nourished.  HENT:  Head: Normocephalic and atraumatic.  Eyes: No scleral icterus.  Cardiovascular: Normal rate.   Pulmonary/Chest: Effort normal.  Skin: Skin is warm and dry.       Incisions are healing well. There is mild erythema at the umbilicus.          Assessment & Plan:  Post op--doing well Return to full activity and work on 05/11/12.

## 2012-04-28 NOTE — Patient Instructions (Signed)
Ovarian Cyst The ovaries are small organs that are on each side of the uterus. The ovaries are the organs that produce the female hormones, estrogen and progesterone. An ovarian cyst is a sac filled with fluid that can vary in its size. It is normal for a small cyst to form in women who are in the childbearing age and who have menstrual periods. This type of cyst is called a follicle cyst that becomes an ovulation cyst (corpus luteum cyst) after it produces the women's egg. It later goes away on its own if the woman does not become pregnant. There are other kinds of ovarian cysts that may cause problems and may need to be treated. The most serious problem is a cyst with cancer. It should be noted that menopausal women who have an ovarian cyst are at a higher risk of it being a cancer cyst. They should be evaluated very quickly, thoroughly and followed closely. This is especially true in menopausal women because of the high rate of ovarian cancer in women in menopause. CAUSES AND TYPES OF OVARIAN CYSTS:  FUNCTIONAL CYST: The follicle/corpus luteum cyst is a functional cyst that occurs every month during ovulation with the menstrual cycle. They go away with the next menstrual cycle if the woman does not get pregnant. Usually, there are no symptoms with a functional cyst.   ENDOMETRIOMA CYST: This cyst develops from the lining of the uterus tissue. This cyst gets in or on the ovary. It grows every month from the bleeding during the menstrual period. It is also called a "chocolate cyst" because it becomes filled with blood that turns brown. This cyst can cause pain in the lower abdomen during intercourse and with your menstrual period.   CYSTADENOMA CYST: This cyst develops from the cells on the outside of the ovary. They usually are not cancerous. They can get very big and cause lower abdomen pain and pain with intercourse. This type of cyst can twist on itself, cut off its blood supply and cause severe pain.  It also can easily rupture and cause a lot of pain.   DERMOID CYST: This type of cyst is sometimes found in both ovaries. They are found to have different kinds of body tissue in the cyst. The tissue includes skin, teeth, hair, and/or cartilage. They usually do not have symptoms unless they get very big. Dermoid cysts are rarely cancerous.   POLYCYSTIC OVARY: This is a rare condition with hormone problems that produces many small cysts on both ovaries. The cysts are follicle-like cysts that never produce an egg and become a corpus luteum. It can cause an increase in body weight, infertility, acne, increase in body and facial hair and lack of menstrual periods or rare menstrual periods. Many women with this problem develop type 2 diabetes. The exact cause of this problem is unknown. A polycystic ovary is rarely cancerous.   THECA LUTEIN CYST: Occurs when too much hormone (human chorionic gonadotropin) is produced and over-stimulates the ovaries to produce an egg. They are frequently seen when doctors stimulate the ovaries for invitro-fertilization (test tube babies).   LUTEOMA CYST: This cyst is seen during pregnancy. Rarely it can cause an obstruction to the birth canal during labor and delivery. They usually go away after delivery.  SYMPTOMS   Pelvic pain or pressure.   Pain during sexual intercourse.   Increasing girth (swelling) of the abdomen.   Abnormal menstrual periods.   Increasing pain with menstrual periods.   You stop having   menstrual periods and you are not pregnant.  DIAGNOSIS  The diagnosis can be made during:  Routine or annual pelvic examination (common).   Ultrasound.   X-ray of the pelvis.   CT Scan.   MRI.   Blood tests.  TREATMENT   Treatment may only be to follow the cyst monthly for 2 to 3 months with your caregiver. Many go away on their own, especially functional cysts.   May be aspirated (drained) with a long needle with ultrasound, or by laparoscopy  (inserting a tube into the pelvis through a small incision).   The whole cyst can be removed by laparoscopy.   Sometimes the cyst may need to be removed through an incision in the lower abdomen.   Hormone treatment is sometimes used to help dissolve certain cysts.   Birth control pills are sometimes used to help dissolve certain cysts.  HOME CARE INSTRUCTIONS  Follow your caregiver's advice regarding:  Medicine.   Follow up visits to evaluate and treat the cyst.   You may need to come back or make an appointment with another caregiver, to find the exact cause of your cyst, if your caregiver is not a gynecologist.   Get your yearly and recommended pelvic examinations and Pap tests.   Let your caregiver know if you have had an ovarian cyst in the past.  SEEK MEDICAL CARE IF:   Your periods are late, irregular, they stop, or are painful.   Your stomach (abdomen) or pelvic pain does not go away.   Your stomach becomes larger or swollen.   You have pressure on your bladder or trouble emptying your bladder completely.   You have painful sexual intercourse.   You have feelings of fullness, pressure, or discomfort in your stomach.   You lose weight for no apparent reason.   You feel generally ill.   You become constipated.   You lose your appetite.   You develop acne.   You have an increase in body and facial hair.   You are gaining weight, without changing your exercise and eating habits.   You think you are pregnant.  SEEK IMMEDIATE MEDICAL CARE IF:   You have increasing abdominal pain.   You feel sick to your stomach (nausea) and/or vomit.   You develop a fever that comes on suddenly.   You develop abdominal pain during a bowel movement.   Your menstrual periods become heavier than usual.  Document Released: 10/14/2005 Document Revised: 10/03/2011 Document Reviewed: 08/17/2009 [employer] Patient Information 2012 [employer]. 

## 2012-04-28 NOTE — Progress Notes (Signed)
Here for routine post op check, complaining of belly button pain, she rates at a six, worst with movement.

## 2012-05-01 ENCOUNTER — Encounter (HOSPITAL_COMMUNITY): Payer: Self-pay | Admitting: *Deleted

## 2012-05-01 ENCOUNTER — Emergency Department (HOSPITAL_COMMUNITY)
Admission: EM | Admit: 2012-05-01 | Discharge: 2012-05-02 | Disposition: A | Discharge status: Home / Self Care | Payer: Managed Care, Other (non HMO) | Attending: Emergency Medicine | Admitting: Emergency Medicine

## 2012-05-01 DIAGNOSIS — L03319 Cellulitis of trunk, unspecified: Secondary | ICD-10-CM | POA: Insufficient documentation

## 2012-05-01 DIAGNOSIS — L0390 Cellulitis, unspecified: Secondary | ICD-10-CM

## 2012-05-01 DIAGNOSIS — R109 Unspecified abdominal pain: Secondary | ICD-10-CM

## 2012-05-01 DIAGNOSIS — K219 Gastro-esophageal reflux disease without esophagitis: Secondary | ICD-10-CM | POA: Insufficient documentation

## 2012-05-01 DIAGNOSIS — J45909 Unspecified asthma, uncomplicated: Secondary | ICD-10-CM | POA: Insufficient documentation

## 2012-05-01 DIAGNOSIS — I10 Essential (primary) hypertension: Secondary | ICD-10-CM | POA: Insufficient documentation

## 2012-05-01 DIAGNOSIS — Z7982 Long term (current) use of aspirin: Secondary | ICD-10-CM | POA: Insufficient documentation

## 2012-05-01 DIAGNOSIS — L02219 Cutaneous abscess of trunk, unspecified: Secondary | ICD-10-CM | POA: Insufficient documentation

## 2012-05-01 DIAGNOSIS — E785 Hyperlipidemia, unspecified: Secondary | ICD-10-CM | POA: Insufficient documentation

## 2012-05-01 DIAGNOSIS — F411 Generalized anxiety disorder: Secondary | ICD-10-CM | POA: Insufficient documentation

## 2012-05-01 LAB — CBC WITH DIFFERENTIAL/PLATELET
Basophils Absolute: 0 10*3/uL (ref 0.0–0.1)
Basophils Relative: 0 % (ref 0–1)
Eosinophils Absolute: 0.3 10*3/uL (ref 0.0–0.7)
Eosinophils Relative: 3 % (ref 0–5)
HCT: 39.7 % (ref 36.0–46.0)
Hemoglobin: 13.6 g/dL (ref 12.0–15.0)
Lymphocytes Relative: 34 % (ref 12–46)
Lymphs Abs: 3.8 10*3/uL (ref 0.7–4.0)
MCH: 27.9 pg (ref 26.0–34.0)
MCHC: 34.3 g/dL (ref 30.0–36.0)
MCV: 81.4 fL (ref 78.0–100.0)
Monocytes Absolute: 0.6 10*3/uL (ref 0.1–1.0)
Monocytes Relative: 5 % (ref 3–12)
Neutro Abs: 6.4 10*3/uL (ref 1.7–7.7)
Neutrophils Relative %: 58 % (ref 43–77)
Platelets: 191 10*3/uL (ref 150–400)
RBC: 4.88 MIL/uL (ref 3.87–5.11)
RDW: 13.2 % (ref 11.5–15.5)
WBC: 11.1 10*3/uL — ABNORMAL HIGH (ref 4.0–10.5)

## 2012-05-01 NOTE — ED Notes (Addendum)
C/o diffuse abd pain, onset Tuesday, redness around umbilicus first noticed this am, area of redness is getting larger (6x4cm), also reports abd distention, recent R oophorectomy 6/13. (denies: nvd, fever, bleeding, back pain or other sx).

## 2012-05-02 ENCOUNTER — Emergency Department (HOSPITAL_COMMUNITY): Payer: Managed Care, Other (non HMO)

## 2012-05-02 LAB — COMPREHENSIVE METABOLIC PANEL
ALT: 40 U/L — ABNORMAL HIGH (ref 0–35)
AST: 27 U/L (ref 0–37)
Albumin: 3.9 g/dL (ref 3.5–5.2)
Alkaline Phosphatase: 57 U/L (ref 39–117)
BUN: 15 mg/dL (ref 6–23)
CO2: 26 mEq/L (ref 19–32)
Calcium: 9.9 mg/dL (ref 8.4–10.5)
Chloride: 101 mEq/L (ref 96–112)
Creatinine, Ser: 0.7 mg/dL (ref 0.50–1.10)
GFR calc Af Amer: >90 mL/min (ref >90)
GFR calc non Af Amer: >90 mL/min (ref >90)
Glucose, Bld: 96 mg/dL (ref 70–99)
Potassium: 4.3 mEq/L (ref 3.5–5.1)
Sodium: 138 mEq/L (ref 135–145)
Total Bilirubin: 0.2 mg/dL — ABNORMAL LOW (ref 0.3–1.2)
Total Protein: 7.7 g/dL (ref 6.0–8.3)

## 2012-05-02 LAB — URINALYSIS, ROUTINE W REFLEX MICROSCOPIC
Bilirubin Urine: NEGATIVE
Glucose, UA: NEGATIVE mg/dL
Hgb urine dipstick: NEGATIVE
Ketones, ur: NEGATIVE mg/dL
Leukocytes, UA: NEGATIVE
Nitrite: NEGATIVE
Protein, ur: NEGATIVE mg/dL
Specific Gravity, Urine: 1.019 (ref 1.005–1.030)
Urobilinogen, UA: 0.2 mg/dL (ref 0.0–1.0)
pH: 6.5 (ref 5.0–8.0)

## 2012-05-02 LAB — LIPASE, BLOOD: Lipase: 38 U/L (ref 11–59)

## 2012-05-02 MED ORDER — SODIUM CHLORIDE 0.9 % IV BOLUS (SEPSIS)
1000.0000 mL | Freq: Once | INTRAVENOUS | Status: AC
  Administered 2012-05-02: 1000 mL via INTRAVENOUS

## 2012-05-02 MED ORDER — SULFAMETHOXAZOLE-TRIMETHOPRIM 800-160 MG PO TABS: 1.0000 | ORAL_TABLET | Freq: Two times a day (BID) | ORAL | Status: AC

## 2012-05-02 MED ORDER — IOHEXOL 300 MG/ML  SOLN
100.0000 mL | Freq: Once | INTRAMUSCULAR | Status: AC | PRN
  Administered 2012-05-02: 100 mL via INTRAVENOUS

## 2012-05-02 MED ORDER — NAPROXEN 500 MG PO TABS: 500.0000 mg | ORAL_TABLET | Freq: Two times a day (BID) | ORAL | Status: DC

## 2012-05-02 NOTE — ED Notes (Signed)
Returned from CT.

## 2012-05-02 NOTE — ED Provider Notes (Signed)
History     CSN: [id number]  Arrival date & time 05/01/12  2232   First MD Initiated Contact with Patient 05/01/12 2342      Chief Complaint  Patient presents with  . Abdominal Pain    (Consider location/radiation/quality/duration/timing/severity/associated sxs/prior treatment) HPI Patient is emergency department with abdominal pain, has been present since Tuesday.  Patient states her abdomen is also distended.  She states that the area around her belly button as reddened.  She states she had her right ovary removed on the 04/09/2012.  Patient followed up with her GYN for recheck.  Patient states that after her followup the next day.  She is noticing redness around her umbilicus.  Patient denies, fevers, nausea, vomiting, diarrhea, weakness, syncope, dizziness, or back pain.  Patient has pain around the umbilicus.  Past Medical History  Diagnosis Date  . Anxiety   . GERD (gastroesophageal reflux disease)   . Hyperlipidemia   . Hypertension   . Asthma     only when gets a cold-expired inhaler  . PONV (postoperative nausea and vomiting)     Past Surgical History  Procedure Date  . Nasal stenosis repair 2003   . Breast surgery 2004    LUMPECTOMY  . Cesarean section     3  . Tubal ligation   . Abdominal hysterectomy 07-2006  . Carpal tunnel release 4-10    LEFT (Dr. Brynda Greathouse)  . Laparoscopy 04/09/2012    Procedure: LAPAROSCOPY OPERATIVE;  Surgeon: Reva Bores, MD;  Location: [facility];  Service: Gynecology;  Laterality: N/A;  . Salpingoophorectomy 04/09/2012    Procedure: SALPINGO OOPHERECTOMY;  Surgeon: Reva Bores, MD;  Location: [facility];  Service: Gynecology;  Laterality: Right;    Family History  Problem Relation Age of Onset  . Heart attack Mother   . Cancer Mother     lung  . Heart disease Mother   . Hypertension Mother   . Stroke Mother   . Heart attack Father   . Heart disease Father   . Hypertension Father   . Diabetes Maternal Uncle   . Cancer Maternal  Grandmother   . Heart failure Maternal Grandfather   . Heart failure Paternal Grandmother     History  Substance Use Topics  . Smoking status: Never Smoker   . Smokeless tobacco: Never Used  . Alcohol Use: Yes     occasionaly    OB History    Grav Para Term Preterm Abortions TAB SAB Ect Mult Living   4 3   1  1   3       Review of Systems All other systems negative except as documented in the HPI. All pertinent positives and negatives as reviewed in the HPI.  Allergies  Penicillins; Amoxicillin-pot clavulanate; Atorvastatin; Cefprozil; Demerol; Dilaudid; Meloxicam; and Zithromax  Home Medications   Current Outpatient Rx  Name Route Sig Dispense Refill  . ASPIRIN-ACETAMINOPHEN-CAFFEINE 250-250-65 MG PO TABS Oral Take 1 tablet by mouth every 6 (six) hours as needed. For pain    . BISOPROLOL FUMARATE 5 MG PO TABS  TAKE 1 TABLET BY MOUTH ONCE A DAY 90 tablet 0  . IBUPROFEN 200 MG PO TABS Oral Take 200 mg by mouth every 6 (six) hours as needed. Takes for pain    . OMEPRAZOLE-SODIUM BICARBONATE 40-1100 MG PO CAPS Oral Take 2 capsules by mouth 2 (two) times daily.       BP 135/91  Pulse 106  Temp 98.4 F (36.9 C) (Oral)  Resp 18  SpO2 97%  Physical Exam  Constitutional: She appears well-developed and well-nourished. No distress.  HENT:  Head: Normocephalic and atraumatic.  Mouth/Throat: Oropharynx is clear and moist.  Cardiovascular: Normal rate.  Exam reveals no gallop and no friction rub.   No murmur heard. Pulmonary/Chest: Effort normal and breath sounds normal. No respiratory distress.  Abdominal: Normal appearance and bowel sounds are normal. She exhibits distension. There is tenderness in the periumbilical area. There is no rigidity, no rebound and no guarding.    Skin: Skin is warm and dry.    ED Course  Procedures (including critical care time)  Labs Reviewed  COMPREHENSIVE METABOLIC PANEL - Abnormal; Notable for the following:    ALT 40 (*)     Total  Bilirubin 0.2 (*)     All other components within normal limits  CBC WITH DIFFERENTIAL - Abnormal; Notable for the following:    WBC 11.1 (*)     All other components within normal limits  LIPASE, BLOOD  URINALYSIS, ROUTINE W REFLEX MICROSCOPIC   Sign out given to Dr. Hyacinth Meeker. He will take over the patient and formally dispo her once her CT scan is back. We both went into the room and spoke with the patient about the change in provider and updated her and answered her questions. CT scan is pending.  MDM          Carlyle Dolly, PA-C 05/02/12 0134

## 2012-05-02 NOTE — ED Provider Notes (Signed)
46 year old female, several weeks status post abdominal surgery by laparoscopic approach who presents with mid abdominal pain, mild distention and periumbilical redness and tenderness. On my exam the patient has mild to moderate tenderness in the periumbilical area with some erythema, no significant masses, normal bowel sounds, normal heart and lung exam, no peripheral edema. She does not appear to be in any acute distress and has a mild leukocytosis. CT scan pending to rule out intra-abdominal pathology.  Medical screening examination/treatment/procedure(s) were conducted as a shared visit with non-physician practitioner(s) and myself.  I personally evaluated the patient during the encounter    Vida Roller, MD 05/02/12 [phone]

## 2012-05-05 ENCOUNTER — Ambulatory Visit: Payer: Managed Care, Other (non HMO) | Admitting: Family Medicine

## 2012-05-07 ENCOUNTER — Encounter: Payer: Self-pay | Admitting: Obstetrics & Gynecology

## 2012-05-07 ENCOUNTER — Ambulatory Visit (INDEPENDENT_AMBULATORY_CARE_PROVIDER_SITE_OTHER): Payer: Managed Care, Other (non HMO) | Admitting: Obstetrics & Gynecology

## 2012-05-07 VITALS — BP 124/89 | HR 95 | Ht 61.0 in | Wt 190.0 lb

## 2012-05-07 DIAGNOSIS — T8149XA Infection following a procedure, other surgical site, initial encounter: Secondary | ICD-10-CM

## 2012-05-07 DIAGNOSIS — T8140XA Infection following a procedure, unspecified, initial encounter: Secondary | ICD-10-CM

## 2012-05-07 NOTE — Progress Notes (Signed)
History:  46 y.o. Yvonne Stephenson here today for evaluation of umbilical incision infection. Underwent laparoscopy and LOA by Dr. Shawnie Pons on 04/09/12, had a normal postoperative check on 04/28/12.  However, on 05/01/12, she had increased umbilical pain and was seen in the ER, and diagnosed with cellulitis of her umbilical incision. She was given a course of Bactrim and analgesia. Patient reports that the erythema has significantly subsided, it measured about 4 x 6 inches prior to going to the ER and now only her incision is red.  She is about to travel to [place], wants to know that it looks okay and that her prescribed medications are okay. No other symptoms.  The following portions of the patient's history were reviewed and updated as appropriate: allergies, current medications, past family history, past medical history, past social history, past surgical history and problem list.  Review of Systems:  Pertinent items are noted in HPI.  Objective:  Physical Exam Blood pressure 124/89, pulse 95, height 5\' 1"  (1.549 m), weight 190 lb (86.183 kg). Gen: NAD Abd: Soft, nontender, nondistended.  All laparoscopic incisions are clean,dry and intact. Umbilical incision with a little erythema, no drainage, no induration. Pelvic: Deferred  Labs and Imaging 05/02/2012  CT ABDOMEN AND PELVIS WITH CONTRAST  Clinical Data: Abdominal pain and distension.  Comparison: CT of the abdomen and pelvis performed 01/09/2012, and pelvic ultrasound performed 03/13/2012  Findings: The visualized lung bases are clear.  There is diffuse fatty infiltration within the liver, with mild sparing about the gallbladder fossa.  The liver is otherwise unremarkable in appearance.  The spleen is within normal limits. The gallbladder is within normal limits.  The pancreas and adrenal glands are unremarkable.  The kidneys are unremarkable in appearance.  There is no evidence of hydronephrosis.  No renal or ureteral stones are seen.  No perinephric stranding is  appreciated.  The small bowel is unremarkable in appearance.  The stomach is within normal limits.  No acute vascular abnormalities are seen. Mild scattered calcification is noted along the abdominal aorta and its branches.  The appendix is normal in caliber, without evidence for appendicitis.  Contrast progresses to the level of the transverse colon.  The colon is grossly unremarkable in appearance.  The bladder is mildly distended and grossly unremarkable in appearance.  The patient is status post hysterectomy; the left ovary is grossly unremarkable in appearance.  No suspicious adnexal masses are seen.  Minimal stranding is noted about apparent residual right ovarian tissue, likely postoperative in nature.  No inguinal lymphadenopathy is seen.  The umbilicus is grossly unremarkable in appearance.  Trace free fluid within the pelvis is likely physiologic in nature.  No acute osseous abnormalities are identified.  Vacuum phenomenon and disc space narrowing are noted at L5-S1.  IMPRESSION:  1.  No acute abnormalities seen within the abdomen or pelvis. 2.  Mild scattered calcification along the abdominal aorta and its branches. 3.  Diffuse fatty infiltration within the liver.  Original Report Authenticated By: Tonia Ghent, M.D.   Assessment & Plan:  Incisional cellulitis, improving on appropriate antibiotic therapy Patient cleared for travel; told to call/come in/go to nearest ER for worsening symptoms.

## 2012-05-07 NOTE — Patient Instructions (Signed)
Return to clinic for any scheduled appointments or for any gynecologic concerns as needed.   

## 2012-05-27 ENCOUNTER — Encounter: Payer: Managed Care, Other (non HMO) | Admitting: Internal Medicine

## 2012-05-29 ENCOUNTER — Encounter: Payer: Managed Care, Other (non HMO) | Admitting: Internal Medicine

## 2012-07-02 NOTE — Telephone Encounter (Signed)
Spoke with patient, has been taken care of.

## 2012-07-10 ENCOUNTER — Encounter: Payer: Self-pay | Admitting: Internal Medicine

## 2012-07-10 ENCOUNTER — Ambulatory Visit (INDEPENDENT_AMBULATORY_CARE_PROVIDER_SITE_OTHER): Payer: Managed Care, Other (non HMO) | Admitting: Internal Medicine

## 2012-07-10 VITALS — BP 126/70 | HR 72 | Temp 98.3°F | Wt 185.8 lb

## 2012-07-10 DIAGNOSIS — E785 Hyperlipidemia, unspecified: Secondary | ICD-10-CM

## 2012-07-10 DIAGNOSIS — Z0000 Encounter for general adult medical examination without abnormal findings: Secondary | ICD-10-CM | POA: Insufficient documentation

## 2012-07-10 DIAGNOSIS — I10 Essential (primary) hypertension: Secondary | ICD-10-CM

## 2012-07-10 LAB — LIPID PANEL
Cholesterol: 211 mg/dL — ABNORMAL HIGH (ref 0–200)
HDL: 39 mg/dL — ABNORMAL LOW (ref >39)
LDL Cholesterol: 93 mg/dL (ref 0–99)
Total CHOL/HDL Ratio: 5.4 Ratio
Triglycerides: 396 mg/dL — ABNORMAL HIGH (ref <150)
VLDL: 79 mg/dL — ABNORMAL HIGH (ref 0–40)

## 2012-07-10 MED ORDER — OMEPRAZOLE-SODIUM BICARBONATE 40-1100 MG PO CAPS: 1.0000 | ORAL_CAPSULE | Freq: Two times a day (BID) | ORAL | Status: DC

## 2012-07-10 MED ORDER — BISOPROLOL FUMARATE 5 MG PO TABS: 5.0000 mg | ORAL_TABLET | Freq: Every day | ORAL | Status: DC

## 2012-07-10 NOTE — Assessment & Plan Note (Signed)
BP Readings from Last 3 Encounters:  07/10/12 126/70  05/07/12 124/89  05/02/12 127/78   Good control Lab Results  Component Value Date   CREATININE 0.70 05/01/2012

## 2012-07-10 NOTE — Assessment & Plan Note (Signed)
Off statin due to LFT elevations and fatty liver on imaging Will check baseline again off the meds

## 2012-07-10 NOTE — Progress Notes (Signed)
Subjective:    Patient ID: Yvonne Stephenson, female    DOB: [birth year], 46 y.o.   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 aspirin-acetaminophen-caffeine (EXCEDRIN MIGRAINE) 250-250-65 MG per tablet Take 1 tablet by mouth every 6 (six) hours as needed. For pain      . bisoprolol (ZEBETA) 5 MG tablet TAKE 1 TABLET BY MOUTH ONCE A DAY  90 tablet  0  . ibuprofen (ADVIL,MOTRIN) 200 MG tablet Take 200 mg by mouth every 6 (six) hours as needed. Takes for pain      . omeprazole-sodium bicarbonate (ZEGERID) 40-1100 MG per capsule Take 2 capsules by mouth 2 (two) times daily.         Allergies  Allergen Reactions  . Penicillins Shortness Of Breath    REACTION: difficulty breathing  . Amoxicillin-Pot Clavulanate Swelling    Tongue swelling  . Atorvastatin     REACTION: hip pain  . Cefprozil     REACTION: rash  . Demerol Nausea And Vomiting  . Dilaudid (Hydromorphone Hcl) Nausea And Vomiting  . Meloxicam (Meloxicam) Swelling    Face swelling and lip tingling  . Zithromax (Azithromycin)     REACTION: tongue felt funny    Past Medical History  Diagnosis Date  . Anxiety   . GERD (gastroesophageal reflux disease)   . Hyperlipidemia   . Hypertension   . Asthma     only when gets a cold-expired inhaler  . PONV (postoperative nausea and vomiting)     Past Surgical History  Procedure Date  . Nasal stenosis repair 2003   . Breast surgery 2004    LUMPECTOMY  . Cesarean section     3  . Tubal ligation   . Abdominal hysterectomy 07-2006  . Carpal tunnel release 4-10    LEFT (Dr. Brynda Greathouse)  . Laparoscopy 04/09/2012    Procedure: LAPAROSCOPY OPERATIVE;  Surgeon: Reva Bores, MD;   Location: [facility];  Service: Gynecology;  Laterality: N/A;  . Salpingoophorectomy 04/09/2012    Procedure: SALPINGO OOPHERECTOMY;  Surgeon: Reva Bores, MD;  Location: [facility];  Service: Gynecology;  Laterality: Right;    Family History  Problem Relation Age of Onset  . Heart attack Mother   . Cancer Mother     lung  . Heart disease Mother   . Hypertension Mother   . Stroke Mother   . Heart attack Father   . Heart disease Father   . Hypertension Father   . Diabetes Maternal Uncle   . Cancer Maternal Grandmother   . Heart failure Maternal Grandfather   . Heart failure Paternal Grandmother     History   Social History  . Marital Status: Married    Spouse Name: N/A    Number of Children: 3  . Years of Education: N/A   Occupational History  . 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      Social History Main Topics  . Smoking status: Never Smoker   . Smokeless tobacco: Never Used  . Alcohol Use: Yes     occasionaly  . Drug Use: No  . Sexually Active: No   Other Topics  Concern  . Not on file   Social History Narrative   Does walk a little for exercise   Review of Systems  Constitutional: Negative for fatigue and unexpected weight change.       Wears seat belt  HENT: Negative for hearing loss, congestion, rhinorrhea, dental problem and tinnitus.        Ears get clogged---benedryl at night helps Regular with dentist  Eyes: Negative for visual disturbance.       No diplopia or unilateral vision loss  Respiratory: Negative for cough, chest tightness and shortness of breath.   Cardiovascular: Positive for leg swelling. Negative for chest pain and palpitations.       Mild edema in evening if on her feet all day  Gastrointestinal: Negative for nausea, vomiting, abdominal pain, constipation and blood in stool.       No heartburn  Genitourinary: Negative for dysuria and difficulty urinating.       No periods due to hysterectomy No sexual  problems  Musculoskeletal: Positive for back pain. Negative for joint swelling and arthralgias.       Intermittent back pain from herniated disc Ibuprofen helps some  Skin: Negative for rash.       No suspicious lesions  Neurological: Positive for numbness and headaches. Negative for dizziness, syncope, weakness and light-headedness.       Rare migraines---4 ibuprofen helps AM numbness in her arms (despite CTS surgery)  Hematological: Negative for adenopathy. Does not bruise/bleed easily.  Psychiatric/Behavioral: Negative for disturbed wake/sleep cycle and dysphoric mood. The patient is not nervous/anxious.        Objective:   Physical Exam  Constitutional: She is oriented to person, place, and time. She appears well-developed and well-nourished. No distress.  HENT:  Head: Normocephalic and atraumatic.  Right Ear: External ear normal.  Left Ear: External ear normal.  Mouth/Throat: Oropharynx is clear and moist. No oropharyngeal exudate.  Eyes: Conjunctivae normal and EOM are normal. Pupils are equal, round, and reactive to light.  Neck: Normal range of motion. Neck supple. No thyromegaly present.  Cardiovascular: Normal rate, regular rhythm, normal heart sounds and intact distal pulses.  Exam reveals no gallop.   No murmur heard. Pulmonary/Chest: Effort normal and breath sounds normal. No respiratory distress. She has no wheezes. She has no rales.  Abdominal: Soft. There is no tenderness.  Musculoskeletal: She exhibits no edema and no tenderness.  Lymphadenopathy:    She has no cervical adenopathy.  Neurological: She is alert and oriented to person, place, and time.  Skin: No rash noted. No erythema.  Psychiatric: She has a normal mood and affect. Her behavior is normal. Thought content normal.          Assessment & Plan:

## 2012-07-10 NOTE — Assessment & Plan Note (Signed)
Generally healthy Discussed fitness 

## 2012-07-14 ENCOUNTER — Encounter: Payer: Self-pay | Admitting: *Deleted

## 2012-10-19 ENCOUNTER — Encounter: Payer: Self-pay | Admitting: Family Medicine

## 2012-10-19 ENCOUNTER — Ambulatory Visit (INDEPENDENT_AMBULATORY_CARE_PROVIDER_SITE_OTHER): Payer: BC Managed Care – PPO | Admitting: Family Medicine

## 2012-10-19 VITALS — BP 126/84 | HR 110 | Temp 98.5°F | Wt 183.8 lb

## 2012-10-19 DIAGNOSIS — J029 Acute pharyngitis, unspecified: Secondary | ICD-10-CM | POA: Insufficient documentation

## 2012-10-19 LAB — POCT RAPID STREP A (OFFICE): Rapid Strep A Screen: POSITIVE — AB

## 2012-10-19 MED ORDER — CLINDAMYCIN HCL 300 MG PO CAPS: 300.0000 mg | ORAL_CAPSULE | Freq: Three times a day (TID) | ORAL | Status: DC

## 2012-10-19 NOTE — Assessment & Plan Note (Addendum)
3/4 centor criteria plus strep exposure at school. Check RST = positive PCN and azithromycin allergy - will treat with clinda x 10 days. Red flags to return provided.

## 2012-10-19 NOTE — Progress Notes (Signed)
  Subjective:    Patient ID: Yvonne Stephenson, female    DOB: [birth year], 46 y.o.   MRN: [id number]  HPI CC: ST  3d h/o ST, as well as body aches/myalgias.  + sweats.  + ears clogged.  + PNdrainage.  Strep throat going around school.  Has tried tylenol which has helped with aches.  No fevers/chills, cough, appetite changes, abd pain, n/v, ear or tooth pain, congestion, RN.  No smokers at home. No h/o asthma.  Past Medical History  Diagnosis Date  . Anxiety   . GERD (gastroesophageal reflux disease)   . Hyperlipidemia   . Hypertension   . Asthma     only when gets a cold-expired inhaler  . PONV (postoperative nausea and vomiting)      Review of Systems Per HPI    Objective:   Physical Exam  Nursing note and vitals reviewed. Constitutional: She appears well-developed and well-nourished. No distress.  HENT:  Head: Normocephalic and atraumatic.  Right Ear: Hearing, external ear and ear canal normal.  Left Ear: Hearing, tympanic membrane, external ear and ear canal normal.  Nose: No mucosal edema or rhinorrhea. Right sinus exhibits no maxillary sinus tenderness and no frontal sinus tenderness. Left sinus exhibits no maxillary sinus tenderness and no frontal sinus tenderness.  Mouth/Throat: Uvula is midline and mucous membranes are normal. Posterior oropharyngeal edema and posterior oropharyngeal erythema present. No oropharyngeal exudate or tonsillar abscesses.       R TM retracted Raw oropharynx, red papules on posterior soft palate  Eyes: Conjunctivae normal and EOM are normal. Pupils are equal, round, and reactive to light. No scleral icterus.  Neck: Normal range of motion. Neck supple.  Cardiovascular: Normal rate, regular rhythm, normal heart sounds and intact distal pulses.   No murmur heard. Pulmonary/Chest: Effort normal and breath sounds normal. No respiratory distress. She has no wheezes. She has no rales.  Lymphadenopathy:    She has cervical adenopathy (L AC  LAD).  Skin: Skin is warm and dry. No rash noted.       Assessment & Plan:

## 2012-10-19 NOTE — Patient Instructions (Addendum)
Good to see you.  I hope you start feeling better. You have pharyngitis. Treat with clindamycin three times daily for 10 days. Push fluids and plenty of rest. May use ibuprofen for throat inflammation. Salt water gargles. Suck on cold things like popsicles or warm things like herbal teas (whichever soothes the throat better). Return if fever >101.5, worsening pain, or trouble opening/closing mouth, or hoarse voice.

## 2012-10-23 ENCOUNTER — Telehealth: Payer: Self-pay

## 2012-10-23 ENCOUNTER — Encounter: Payer: Self-pay | Admitting: Internal Medicine

## 2012-10-23 ENCOUNTER — Ambulatory Visit (INDEPENDENT_AMBULATORY_CARE_PROVIDER_SITE_OTHER): Payer: BC Managed Care – PPO | Admitting: Internal Medicine

## 2012-10-23 VITALS — BP 122/80 | HR 90 | Temp 97.7°F | Wt 181.0 lb

## 2012-10-23 DIAGNOSIS — R002 Palpitations: Secondary | ICD-10-CM | POA: Insufficient documentation

## 2012-10-23 LAB — T4, FREE: Free T4: 0.82 ng/dL (ref 0.60–1.60)

## 2012-10-23 LAB — TSH: TSH: 0.95 u[IU]/mL (ref 0.35–5.50)

## 2012-10-23 NOTE — Telephone Encounter (Signed)
Pt seen 10/19/12; now pt having palpitations, started 10/20/12,pt feels palpitations worse when resting.non productive cough, chest congestion.No fever and no chest pain. Pt would feel better to be rechecked. Pt taking antibiotic.pt scheduled appt today at 110:15 with Dr Alphonsus Sias.Advised pt if condition worsens prior to appt pt to call back or go to UC if needed. Pt voiced understanding.

## 2012-10-23 NOTE — Progress Notes (Signed)
Subjective:    Patient ID: Yvonne Stephenson, female    DOB: 11/10/1965, 46 y.o.   MRN: [id number]  HPI Throat feels better Noted some tachycardia at that visit  Since then, she has noticed some palpitations She can feel it beating--feels fast at times Generally only lasts a short time Can abort it with big breath Estimate together sounds ~100 occ gets sense that breath is going away---better with cough Not really skipping or irregular Has been taking it easy so no real exertion since this has been happening  Has had this sensation before Mostly from stress  No edema No dizziness or syncope Slept elevated last night---felt heart was faster when she was flat No SOB at night or PND  Current Outpatient Prescriptions on File Prior to Visit  Medication Sig Dispense Refill  . aspirin-acetaminophen-caffeine (EXCEDRIN MIGRAINE) 250-250-65 MG per tablet Take 1 tablet by mouth every 6 (six) hours as needed. For pain      . bisoprolol (ZEBETA) 5 MG tablet Take 1 tablet (5 mg total) by mouth daily.  90 tablet  3  . clindamycin (CLEOCIN) 300 MG capsule Take 1 capsule (300 mg total) by mouth 3 (three) times daily.  30 capsule  0  . ibuprofen (ADVIL,MOTRIN) 200 MG tablet Take 200 mg by mouth every 6 (six) hours as needed. Takes for pain      . omeprazole-sodium bicarbonate (ZEGERID) 40-1100 MG per capsule Take 1 capsule by mouth 2 (two) times daily.  60 capsule  3    Allergies  Allergen Reactions  . Penicillins Shortness Of Breath    REACTION: difficulty breathing  . Amoxicillin-Pot Clavulanate Swelling    Tongue swelling  . Atorvastatin     REACTION: hip pain  . Cefprozil     REACTION: rash  . Demerol Nausea And Vomiting  . Dilaudid (Hydromorphone Hcl) Nausea And Vomiting  . Meloxicam (Meloxicam) Swelling    Face swelling and lip tingling  . Zithromax (Azithromycin)     REACTION: tongue felt funny    Past Medical History  Diagnosis Date  . Anxiety   . GERD (gastroesophageal  reflux disease)   . Hyperlipidemia   . Hypertension   . Asthma     only when gets a cold-expired inhaler  . PONV (postoperative nausea and vomiting)     Past Surgical History  Procedure Date  . Nasal stenosis repair 2003   . Breast surgery 2004    LUMPECTOMY  . Cesarean section     3  . Tubal ligation   . Abdominal hysterectomy 07-2006  . Carpal tunnel release 4-10    LEFT (Dr. Brynda Greathouse)  . Laparoscopy 04/09/2012    Procedure: LAPAROSCOPY OPERATIVE;  Surgeon: Reva Bores, MD;  Location: [facility];  Service: Gynecology;  Laterality: N/A;  . Salpingoophorectomy 04/09/2012    Procedure: SALPINGO OOPHERECTOMY;  Surgeon: Reva Bores, MD;  Location: [facility];  Service: Gynecology;  Laterality: Right;    Family History  Problem Relation Age of Onset  . Heart attack Mother   . Cancer Mother     lung  . Heart disease Mother   . Hypertension Mother   . Stroke Mother   . Heart attack Father   . Heart disease Father   . Hypertension Father   . Diabetes Maternal Uncle   . Cancer Maternal Grandmother   . Heart failure Maternal Grandfather   . Heart failure Paternal Grandmother     History   Social History  . Marital  Status: Married    Spouse Name: N/A    Number of Children: 3  . Years of Education: N/A   Occupational History  . 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      Social History Main Topics  . Smoking status: Never Smoker   . Smokeless tobacco: Never Used  . Alcohol Use: Yes     Comment: occasionaly  . Drug Use: No  . Sexually Active: No   Other Topics Concern  . Not on file   Social History Narrative   Does walk a little for exercise   Review of Systems Weight stable No change in skin or nails Bowels are okay     Objective:   Physical Exam  Constitutional: She appears well-developed and well-nourished. No distress.  Neck: Normal range of motion. Neck supple. No thyromegaly present.  Cardiovascular: Normal rate, regular  rhythm, normal heart sounds and intact distal pulses.  Exam reveals no gallop.   No murmur heard. Pulmonary/Chest: Effort normal and breath sounds normal. No respiratory distress. She has no wheezes. She has no rales.  Abdominal: Soft. There is no tenderness.  Musculoskeletal: She exhibits no edema and no tenderness.  Lymphadenopathy:    She has no cervical adenopathy.  Psychiatric:       Mild anxiety          Assessment & Plan:

## 2012-10-23 NOTE — Assessment & Plan Note (Signed)
Has had in past --mostly anxiety related Nothing to suggest rate fast enough for SVT or true tachyarrhythmia EKG is normal  Reassured Will just check thyroid function

## 2012-10-23 NOTE — Telephone Encounter (Signed)
Will review at appt

## 2012-10-27 ENCOUNTER — Encounter: Payer: Self-pay | Admitting: *Deleted

## 2013-02-01 ENCOUNTER — Other Ambulatory Visit: Payer: Self-pay | Admitting: Internal Medicine

## 2013-03-07 ENCOUNTER — Encounter (HOSPITAL_COMMUNITY): Payer: Self-pay | Admitting: Emergency Medicine

## 2013-03-07 ENCOUNTER — Emergency Department (HOSPITAL_COMMUNITY)
Admission: EM | Admit: 2013-03-07 | Discharge: 2013-03-08 | Disposition: A | Discharge status: Home / Self Care | Payer: BC Managed Care – PPO | Attending: Emergency Medicine | Admitting: Emergency Medicine

## 2013-03-07 DIAGNOSIS — Z8639 Personal history of other endocrine, nutritional and metabolic disease: Secondary | ICD-10-CM | POA: Insufficient documentation

## 2013-03-07 DIAGNOSIS — Z79899 Other long term (current) drug therapy: Secondary | ICD-10-CM | POA: Insufficient documentation

## 2013-03-07 DIAGNOSIS — R509 Fever, unspecified: Secondary | ICD-10-CM | POA: Insufficient documentation

## 2013-03-07 DIAGNOSIS — J45909 Unspecified asthma, uncomplicated: Secondary | ICD-10-CM | POA: Insufficient documentation

## 2013-03-07 DIAGNOSIS — Z880 Allergy status to penicillin: Secondary | ICD-10-CM | POA: Insufficient documentation

## 2013-03-07 DIAGNOSIS — Z9889 Other specified postprocedural states: Secondary | ICD-10-CM | POA: Insufficient documentation

## 2013-03-07 DIAGNOSIS — Z90710 Acquired absence of both cervix and uterus: Secondary | ICD-10-CM | POA: Insufficient documentation

## 2013-03-07 DIAGNOSIS — Z9079 Acquired absence of other genital organ(s): Secondary | ICD-10-CM | POA: Insufficient documentation

## 2013-03-07 DIAGNOSIS — K219 Gastro-esophageal reflux disease without esophagitis: Secondary | ICD-10-CM | POA: Insufficient documentation

## 2013-03-07 DIAGNOSIS — Z8659 Personal history of other mental and behavioral disorders: Secondary | ICD-10-CM | POA: Insufficient documentation

## 2013-03-07 DIAGNOSIS — N12 Tubulo-interstitial nephritis, not specified as acute or chronic: Secondary | ICD-10-CM

## 2013-03-07 DIAGNOSIS — Z862 Personal history of diseases of the blood and blood-forming organs and certain disorders involving the immune mechanism: Secondary | ICD-10-CM | POA: Insufficient documentation

## 2013-03-07 DIAGNOSIS — I10 Essential (primary) hypertension: Secondary | ICD-10-CM | POA: Insufficient documentation

## 2013-03-07 DIAGNOSIS — R350 Frequency of micturition: Secondary | ICD-10-CM | POA: Insufficient documentation

## 2013-03-07 LAB — COMPREHENSIVE METABOLIC PANEL
ALT: 71 U/L — ABNORMAL HIGH (ref 0–35)
AST: 39 U/L — ABNORMAL HIGH (ref 0–37)
Albumin: 4.3 g/dL (ref 3.5–5.2)
Alkaline Phosphatase: 70 U/L (ref 39–117)
BUN: 13 mg/dL (ref 6–23)
CO2: 28 mEq/L (ref 19–32)
Calcium: 10.3 mg/dL (ref 8.4–10.5)
Chloride: 101 mEq/L (ref 96–112)
Creatinine, Ser: 0.81 mg/dL (ref 0.50–1.10)
GFR calc Af Amer: >90 mL/min (ref >90)
GFR calc non Af Amer: 85 mL/min — ABNORMAL LOW (ref >90)
Glucose, Bld: 93 mg/dL (ref 70–99)
Potassium: 4.1 mEq/L (ref 3.5–5.1)
Sodium: 138 mEq/L (ref 135–145)
Total Bilirubin: 0.2 mg/dL — ABNORMAL LOW (ref 0.3–1.2)
Total Protein: 8.2 g/dL (ref 6.0–8.3)

## 2013-03-07 LAB — CBC WITH DIFFERENTIAL/PLATELET
Basophils Absolute: 0 10*3/uL (ref 0.0–0.1)
Basophils Relative: 0 % (ref 0–1)
Eosinophils Absolute: 0.4 10*3/uL (ref 0.0–0.7)
Eosinophils Relative: 3 % (ref 0–5)
HCT: 41.2 % (ref 36.0–46.0)
Hemoglobin: 14.3 g/dL (ref 12.0–15.0)
Lymphocytes Relative: 36 % (ref 12–46)
Lymphs Abs: 3.8 10*3/uL (ref 0.7–4.0)
MCH: 28 pg (ref 26.0–34.0)
MCHC: 34.7 g/dL (ref 30.0–36.0)
MCV: 80.6 fL (ref 78.0–100.0)
Monocytes Absolute: 0.6 10*3/uL (ref 0.1–1.0)
Monocytes Relative: 6 % (ref 3–12)
Neutro Abs: 5.8 10*3/uL (ref 1.7–7.7)
Neutrophils Relative %: 55 % (ref 43–77)
Platelets: 189 10*3/uL (ref 150–400)
RBC: 5.11 MIL/uL (ref 3.87–5.11)
RDW: 13.6 % (ref 11.5–15.5)
WBC: 10.6 10*3/uL — ABNORMAL HIGH (ref 4.0–10.5)

## 2013-03-07 LAB — URINALYSIS, ROUTINE W REFLEX MICROSCOPIC
Bilirubin Urine: NEGATIVE
Glucose, UA: NEGATIVE mg/dL
Hgb urine dipstick: NEGATIVE
Ketones, ur: NEGATIVE mg/dL
Nitrite: NEGATIVE
Protein, ur: NEGATIVE mg/dL
Specific Gravity, Urine: 1.024 (ref 1.005–1.030)
Urobilinogen, UA: 0.2 mg/dL (ref 0.0–1.0)
pH: 6 (ref 5.0–8.0)

## 2013-03-07 LAB — URINE MICROSCOPIC-ADD ON

## 2013-03-07 LAB — LIPASE, BLOOD: Lipase: 50 U/L (ref 11–59)

## 2013-03-07 MED ORDER — CIPROFLOXACIN IN D5W 400 MG/200ML IV SOLN
400.0000 mg | Freq: Once | INTRAVENOUS | Status: AC
  Administered 2013-03-07: 400 mg via INTRAVENOUS
  Filled 2013-03-07: qty 200

## 2013-03-07 MED ORDER — DIPHENHYDRAMINE HCL 25 MG PO CAPS
25.0000 mg | ORAL_CAPSULE | Freq: Once | ORAL | Status: AC
  Administered 2013-03-07: 25 mg via ORAL
  Filled 2013-03-07: qty 1

## 2013-03-07 NOTE — ED Notes (Signed)
Patient with right lower flank pain, starting Friday night, continues on into yesterday and today, radiating into right groin.  Patient denies any nausea or vomiting at this time.

## 2013-03-07 NOTE — ED Notes (Signed)
Pt c/o right flank pain radiating through right groin. Pt denies urinary symptoms. Pt had similar symptoms last year, had cyst on right ovary and ovary was removed.

## 2013-03-07 NOTE — ED Notes (Addendum)
Pt c/o itching in hand. No hives noted. Hand red where pt was scratching it. Cipro paused, PA notified, currently in room assessing pt

## 2013-03-08 LAB — URINE CULTURE
Colony Count: NO GROWTH
Culture: NO GROWTH

## 2013-03-08 MED ORDER — CIPROFLOXACIN HCL 500 MG PO TABS: 500.0000 mg | ORAL_TABLET | Freq: Two times a day (BID) | ORAL | Status: DC

## 2013-03-08 NOTE — ED Provider Notes (Signed)
History     CSN: [id number]  Arrival date & time 03/07/13  2026   First MD Initiated Contact with Patient 03/07/13 2151      Chief Complaint  Patient presents with  . Abdominal Pain    (Consider location/radiation/quality/duration/timing/severity/associated sxs/prior treatment) HPI Comments: Patient presents with a chief complaint of right flank pain.  She reports that the pain has been present for the past two days and is a constant pain.  Pain gradually worsening.  She reports that the pain does not radiate, but that she also has some suprapubic pressure.  She states that she has had a low grade fever.  No nausea or vomiting.  She denies dysuria or hematuria.  She has had some increased urinary frequency.  Denies urinary urgency.  She has not taken anything for her symptoms prior to arrival.  The history is provided by the patient.    Past Medical History  Diagnosis Date  . Anxiety   . GERD (gastroesophageal reflux disease)   . Hyperlipidemia   . Hypertension   . Asthma     only when gets a cold-expired inhaler  . PONV (postoperative nausea and vomiting)     Past Surgical History  Procedure Laterality Date  . Nasal stenosis repair  2003   . Breast surgery  2004    LUMPECTOMY  . Cesarean section      3  . Tubal ligation    . Abdominal hysterectomy  07-2006  . Carpal tunnel release  4-10    LEFT (Dr. Brynda Greathouse)  . Laparoscopy  04/09/2012    Procedure: LAPAROSCOPY OPERATIVE;  Surgeon: Reva Bores, MD;  Location: [facility];  Service: Gynecology;  Laterality: N/A;  . Salpingoophorectomy  04/09/2012    Procedure: SALPINGO OOPHERECTOMY;  Surgeon: Reva Bores, MD;  Location: [facility];  Service: Gynecology;  Laterality: Right;    Family History  Problem Relation Age of Onset  . Heart attack Mother   . Cancer Mother     lung  . Heart disease Mother   . Hypertension Mother   . Stroke Mother   . Heart attack Father   . Heart disease Father   . Hypertension Father   .  Diabetes Maternal Uncle   . Cancer Maternal Grandmother   . Heart failure Maternal Grandfather   . Heart failure Paternal Grandmother     History  Substance Use Topics  . Smoking status: Never Smoker   . Smokeless tobacco: Never Used  . Alcohol Use: Yes     Comment: occasionaly    OB History   Grav Para Term Preterm Abortions TAB SAB Ect Mult Living   4 3   1  1   3       Review of Systems  Constitutional: Positive for fever.  Gastrointestinal: Negative for nausea and vomiting.  Genitourinary: Positive for frequency and flank pain. Negative for dysuria, urgency, hematuria, vaginal bleeding, vaginal discharge and pelvic pain.    Allergies  Penicillins; Amoxicillin-pot clavulanate; Atorvastatin; Cefprozil; Demerol; Dilaudid; Meloxicam; and Zithromax  Home Medications   Current Outpatient Rx  Name  Route  Sig  Dispense  Refill  . bisoprolol (ZEBETA) 5 MG tablet   Oral   Take 1 tablet (5 mg total) by mouth daily.   90 tablet   3   . omeprazole (PRILOSEC) 40 MG capsule   Oral   Take 40 mg by mouth 2 (two) times daily.         . sucralfate (CARAFATE)  1 GM/10ML suspension   Oral   Take 1 g by mouth 2 (two) times daily.           BP 112/74  Pulse 86  Temp(Src) 99.3 F (37.4 C) (Oral)  Resp 18  SpO2 98%  Physical Exam  Nursing note and vitals reviewed. Constitutional: She appears well-developed and well-nourished. No distress.  HENT:  Head: Normocephalic and atraumatic.  Mouth/Throat: Oropharynx is clear and moist.  Neck: Normal range of motion. Neck supple.  Cardiovascular: Normal rate, regular rhythm and normal heart sounds.   Pulmonary/Chest: Effort normal and breath sounds normal.  Abdominal: Soft. Bowel sounds are normal. She exhibits no distension and no mass. There is tenderness in the suprapubic area. There is CVA tenderness. There is no rebound and no guarding.  Right CVA tenderness  Neurological: She is alert.  Skin: Skin is warm and dry. She is  not diaphoretic.  Psychiatric: She has a normal mood and affect.    ED Course  Procedures (including critical care time)  Labs Reviewed  CBC WITH DIFFERENTIAL - Abnormal; Notable for the following:    WBC 10.6 (*)    All other components within normal limits  COMPREHENSIVE METABOLIC PANEL - Abnormal; Notable for the following:    AST 39 (*)    ALT 71 (*)    Total Bilirubin 0.2 (*)    GFR calc non Af Amer 85 (*)    All other components within normal limits  URINALYSIS, ROUTINE W REFLEX MICROSCOPIC - Abnormal; Notable for the following:    APPearance CLOUDY (*)    Leukocytes, UA LARGE (*)    All other components within normal limits  URINE MICROSCOPIC-ADD ON - Abnormal; Notable for the following:    Squamous Epithelial / LPF FEW (*)    Bacteria, UA MANY (*)    All other components within normal limits  URINE CULTURE  LIPASE, BLOOD   No results found.   No diagnosis found.    MDM  Patient presenting with right flank pain that has been constant over the past couple of days along with a low grade fever.  UA shows UTI, but no hemoglobin.  Therefore, feel that symptoms are most consistent with Pyelonephritis.  Patient given dose of IV Cipro while in the ED and discharged home with oral Cipro.  Urine sent for culture.  Patient stable for discharge.  Return precautions given.         Pascal Lux [place], PA-C 03/08/13 1215

## 2013-03-09 NOTE — ED Provider Notes (Signed)
Medical screening examination/treatment/procedure(s) were performed by non-physician practitioner and as supervising physician I was immediately available for consultation/collaboration.   Zali Kamaka, MD 03/09/13 1544 

## 2013-03-10 ENCOUNTER — Encounter: Payer: Self-pay | Admitting: *Deleted

## 2013-03-10 ENCOUNTER — Other Ambulatory Visit: Payer: Self-pay | Admitting: Sports Medicine

## 2013-03-10 ENCOUNTER — Encounter: Payer: Self-pay | Admitting: Internal Medicine

## 2013-03-10 ENCOUNTER — Ambulatory Visit (INDEPENDENT_AMBULATORY_CARE_PROVIDER_SITE_OTHER): Payer: BC Managed Care – PPO | Admitting: Internal Medicine

## 2013-03-10 VITALS — BP 128/80 | HR 72 | Temp 97.8°F | Wt 193.0 lb

## 2013-03-10 DIAGNOSIS — M545 Low back pain, unspecified: Secondary | ICD-10-CM

## 2013-03-10 DIAGNOSIS — R10A1 Flank pain, right side: Secondary | ICD-10-CM | POA: Insufficient documentation

## 2013-03-10 DIAGNOSIS — R109 Unspecified abdominal pain: Secondary | ICD-10-CM

## 2013-03-10 NOTE — Patient Instructions (Addendum)
Please stop the cipro Please set up your physical at your convenience (by the end of the year or so)

## 2013-03-10 NOTE — Assessment & Plan Note (Signed)
Seems to be related to the back issues Urine culture negative and no bladder symptoms---will stop the cipro Will follow up with ortho

## 2013-03-10 NOTE — Progress Notes (Signed)
Subjective:    Patient ID: Yvonne Stephenson, female    DOB: 1966/06/16, 47 y.o.   MRN: [id number]  HPI Seen in ER 3 days ago Records reviewed She had worried about her appendix or ovary  No dysuria, no hematuria Flank and back discomfort is better but still persists Still on the cipro Urine culture was negative  Has been having back pain Seen by ortho at [facility] Had superficial cortisone shots (?in buttocks) Current Outpatient Prescriptions on File Prior to Visit  Medication Sig Dispense Refill  . bisoprolol (ZEBETA) 5 MG tablet Take 1 tablet (5 mg total) by mouth daily.  90 tablet  3  . ciprofloxacin (CIPRO) 500 MG tablet Take 1 tablet (500 mg total) by mouth 2 (two) times daily.  20 tablet  0  . omeprazole (PRILOSEC) 40 MG capsule Take 40 mg by mouth 2 (two) times daily.      . sucralfate (CARAFATE) 1 GM/10ML suspension Take 1 g by mouth 2 (two) times daily.       No current facility-administered medications on file prior to visit.    Allergies  Allergen Reactions  . Penicillins Shortness Of Breath    REACTION: difficulty breathing  . Amoxicillin-Pot Clavulanate Swelling    Tongue swelling  . Atorvastatin     REACTION: hip pain  . Cefprozil     REACTION: rash  . Demerol Nausea And Vomiting  . Dilaudid (Hydromorphone Hcl) Nausea And Vomiting  . Meloxicam (Meloxicam) Swelling    Face swelling and lip tingling  . Zithromax (Azithromycin)     REACTION: tongue felt funny    Past Medical History  Diagnosis Date  . Anxiety   . GERD (gastroesophageal reflux disease)   . Hyperlipidemia   . Hypertension   . Asthma     only when gets a cold-expired inhaler  . PONV (postoperative nausea and vomiting)     Past Surgical History  Procedure Laterality Date  . Nasal stenosis repair  2003   . Breast surgery  2004    LUMPECTOMY  . Cesarean section      3  . Tubal ligation    . Abdominal hysterectomy  07-2006  . Carpal tunnel release  4-10    LEFT (Dr. Brynda Greathouse)   . Laparoscopy  04/09/2012    Procedure: LAPAROSCOPY OPERATIVE;  Surgeon: Reva Bores, MD;  Location: [facility];  Service: Gynecology;  Laterality: N/A;  . Salpingoophorectomy  04/09/2012    Procedure: SALPINGO OOPHERECTOMY;  Surgeon: Reva Bores, MD;  Location: [facility];  Service: Gynecology;  Laterality: Right;    Family History  Problem Relation Age of Onset  . Heart attack Mother   . Cancer Mother     lung  . Heart disease Mother   . Hypertension Mother   . Stroke Mother   . Heart attack Father   . Heart disease Father   . Hypertension Father   . Diabetes Maternal Uncle   . Cancer Maternal Grandmother   . Heart failure Maternal Grandfather   . Heart failure Paternal Grandmother     History   Social History  . Marital Status: Married    Spouse Name: N/A    Number of Children: 3  . Years of Education: N/A   Occupational History  . 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      Social History Main Topics  . Smoking status: Never Smoker   . Smokeless tobacco: Never  Used  . Alcohol Use: Yes     Comment: occasionaly  . Drug Use: No  . Sexually Active: No   Other Topics Concern  . Not on file   Social History Narrative   Does walk a little for exercise     Review of Systems No fever No nausea or vomiting Appetite was off---some better yesterday    Objective:   Physical Exam  Constitutional: She appears well-developed and well-nourished. No distress.  Abdominal: Soft. She exhibits no distension and no mass. There is no tenderness. There is no rebound and no guarding.  Musculoskeletal:  No spine or CVA tenderness Some low lumbar back tenderness along flank on right          Assessment & Plan:

## 2013-03-14 ENCOUNTER — Ambulatory Visit
Admission: RE | Admit: 2013-03-14 | Discharge: 2013-03-14 | Disposition: A | Discharge status: Home / Self Care | Payer: BC Managed Care – PPO | Source: Ambulatory Visit | Attending: Sports Medicine | Admitting: Sports Medicine

## 2013-03-14 DIAGNOSIS — M545 Low back pain, unspecified: Secondary | ICD-10-CM

## 2013-08-30 ENCOUNTER — Encounter: Payer: Self-pay | Admitting: Internal Medicine

## 2013-08-30 ENCOUNTER — Ambulatory Visit (INDEPENDENT_AMBULATORY_CARE_PROVIDER_SITE_OTHER): Payer: BC Managed Care – PPO | Admitting: Internal Medicine

## 2013-08-30 ENCOUNTER — Encounter: Payer: Self-pay | Admitting: *Deleted

## 2013-08-30 VITALS — BP 120/70 | HR 79 | Temp 98.0°F | Wt 195.0 lb

## 2013-08-30 DIAGNOSIS — S0990XA Unspecified injury of head, initial encounter: Secondary | ICD-10-CM

## 2013-08-30 NOTE — Progress Notes (Signed)
Subjective:    Patient ID: Yvonne Stephenson, female    DOB: November 30, 1965, 47 y.o.   MRN: [id number]  HPI Tripped at work on 10/30--going up steps (was a little late and dizzy----headache the night before) Hit head on glass door---then fell to the ground No loss of consciousness Helped up by some other people August Saucer saw her and secretaries Sat in dark room---then picked up by husband Had headache  Went to orthopedist that same day Took x-rays of right shoulder and left knee--no findings  Still feels tired Forehead still tender--- sensitive even pulling hair on her head Slight cut on bridge of nose Slight headache Some photophobia---intermittent  Feels weak and unbalanced walking No weakness in arms or legs No cognitive changes  Current Outpatient Prescriptions on File Prior to Visit  Medication Sig Dispense Refill  . bisoprolol (ZEBETA) 5 MG tablet Take 1 tablet (5 mg total) by mouth daily.  90 tablet  3  . omeprazole (PRILOSEC) 40 MG capsule Take 40 mg by mouth 2 (two) times daily.      . sucralfate (CARAFATE) 1 GM/10ML suspension Take 1 g by mouth 2 (two) times daily.       No current facility-administered medications on file prior to visit.    Allergies  Allergen Reactions  . Penicillins Shortness Of Breath    REACTION: difficulty breathing  . Amoxicillin-Pot Clavulanate Swelling    Tongue swelling  . Atorvastatin     REACTION: hip pain  . Cefprozil     REACTION: rash  . Demerol Nausea And Vomiting  . Dilaudid [Hydromorphone Hcl] Nausea And Vomiting  . Meloxicam [Meloxicam] Swelling    Face swelling and lip tingling  . Zithromax [Azithromycin]     REACTION: tongue felt funny    Past Medical History  Diagnosis Date  . Anxiety   . GERD (gastroesophageal reflux disease)   . Hyperlipidemia   . Hypertension   . Asthma     only when gets a cold-expired inhaler  . PONV (postoperative nausea and vomiting)     Past Surgical History  Procedure Laterality Date  .  Nasal stenosis repair  2003   . Breast surgery  2004    LUMPECTOMY  . Cesarean section      3  . Tubal ligation    . Abdominal hysterectomy  07-2006  . Carpal tunnel release  4-10    LEFT (Dr. Brynda Greathouse)  . Laparoscopy  04/09/2012    Procedure: LAPAROSCOPY OPERATIVE;  Surgeon: Reva Bores, MD;  Location: [facility];  Service: Gynecology;  Laterality: N/A;  . Salpingoophorectomy  04/09/2012    Procedure: SALPINGO OOPHERECTOMY;  Surgeon: Reva Bores, MD;  Location: [facility];  Service: Gynecology;  Laterality: Right;    Family History  Problem Relation Age of Onset  . Heart attack Mother   . Cancer Mother     lung  . Heart disease Mother   . Hypertension Mother   . Stroke Mother   . Heart attack Father   . Heart disease Father   . Hypertension Father   . Diabetes Maternal Uncle   . Cancer Maternal Grandmother   . Heart failure Maternal Grandfather   . Heart failure Paternal Grandmother     History   Social History  . Marital Status: Married    Spouse Name: N/A    Number of Children: 3  . 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      Social History Main Topics  . Smoking status: Never Smoker   . Smokeless tobacco: Never Used  . Alcohol Use: Yes     Comment: occasionaly  . Drug Use: No  . Sexual Activity: No   Other Topics Concern  . Not on file   Social History Narrative   Does walk a little for exercise   Review of Systems Eating okay--but appetite is decreasing Slight nausea that day--- no vomiting    Objective:   Physical Exam  Constitutional: She appears well-developed and well-nourished. No distress.  HENT:  Mouth/Throat: Oropharynx is clear and moist. No oropharyngeal exudate.  Mild tenderness on forehead and bridge of nose  Eyes: Conjunctivae and EOM are normal. Pupils are equal, round, and reactive to light.  Fundi benign  Neck: Normal range of motion. Neck supple. No  thyromegaly present.  Lymphadenopathy:    She has no cervical adenopathy.  Neurological: She is alert. She has normal strength. She displays no tremor. No cranial nerve deficit. She exhibits normal muscle tone. She displays a negative Romberg sign. Coordination and gait normal.  Psychiatric: She has a normal mood and affect. Her behavior is normal.          Assessment & Plan:

## 2013-08-30 NOTE — Assessment & Plan Note (Signed)
No loss of conciousness Doesn't feel quite right----but normal neuro exam No cognitive changes  Reassured about exam Can go back to work---might need to end early if gets headache Husband will drive her tomorrow

## 2013-09-02 ENCOUNTER — Other Ambulatory Visit: Payer: Self-pay

## 2013-09-09 ENCOUNTER — Encounter: Payer: Self-pay | Admitting: Family Medicine

## 2013-09-09 ENCOUNTER — Ambulatory Visit (INDEPENDENT_AMBULATORY_CARE_PROVIDER_SITE_OTHER): Payer: BC Managed Care – PPO | Admitting: Family Medicine

## 2013-09-09 VITALS — BP 128/90 | HR 104 | Temp 98.4°F | Ht 61.0 in | Wt 195.8 lb

## 2013-09-09 DIAGNOSIS — Z888 Allergy status to other drugs, medicaments and biological substances status: Secondary | ICD-10-CM

## 2013-09-09 DIAGNOSIS — B9789 Other viral agents as the cause of diseases classified elsewhere: Secondary | ICD-10-CM

## 2013-09-09 DIAGNOSIS — B349 Viral infection, unspecified: Secondary | ICD-10-CM

## 2013-09-09 DIAGNOSIS — J029 Acute pharyngitis, unspecified: Secondary | ICD-10-CM

## 2013-09-09 LAB — POCT RAPID STREP A (OFFICE): Rapid Strep A Screen: NEGATIVE

## 2013-09-09 MED ORDER — MAGIC MOUTHWASH W/LIDOCAINE: 5.0000 mL | Freq: Four times a day (QID) | ORAL | Status: DC | PRN

## 2013-09-09 MED ORDER — HYDROCODONE-HOMATROPINE 5-1.5 MG/5ML PO SYRP: ORAL_SOLUTION | ORAL | Status: DC

## 2013-09-09 NOTE — Progress Notes (Signed)
Pre-visit discussion using our clinic review tool. No additional management support is needed unless otherwise documented below in the visit note.  

## 2013-09-09 NOTE — Progress Notes (Signed)
Date:  09/09/2013   Name:  Yvonne Stephenson   DOB:  1966/02/09   MRN:  [id number] Gender: female Age: 47 y.o.  Primary Physician:  Tillman Abide, MD   Chief Complaint: Sore Throat and Cough   Subjective:   History of Present Illness:  Yvonne Stephenson is a 57 y.o. very pleasant female patient who presents with the following:  Sore throat for 2 weeks, and came back on Monday, and woke up with a severe sore throat. Last night hit neb machine. She feel like she is wheezing and having some difficulty breathing and coughing a lot else. Her throat has been horribly sore per her report. She also generally does not feel although well and she is feeling achy. She is tolerating p.o. Intake, but it is decreased.  Past Medical History, Surgical History, Social History, Family History, Problem List, Medications, and Allergies have been reviewed and updated if relevant.  Allergies  Allergen Reactions  . Penicillins Shortness Of Breath    REACTION: difficulty breathing  . Amoxicillin-Pot Clavulanate Swelling    Tongue swelling  . Atorvastatin     REACTION: hip pain  . Cefprozil     REACTION: rash  . Demerol Nausea And Vomiting  . Dilaudid [Hydromorphone Hcl] Nausea And Vomiting  . Meloxicam [Meloxicam] Swelling    Face swelling and lip tingling  . Zithromax [Azithromycin]     REACTION: tongue felt funny    Review of Systems: ROS: GEN: Acute illness details above GI: Tolerating PO intake GU: maintaining adequate hydration and urination Pulm: No SOB Interactive and getting along well at home.  Otherwise, ROS is as per the HPI.   Objective:   Physical Examination: BP 128/90  Pulse 104  Temp(Src) 98.4 F (36.9 C) (Oral)  Ht 5\' 1"  (1.549 m)  Wt 195 lb 12 oz (88.792 kg)  BMI 37.01 kg/m2   Gen: WDWN, NAD; A & O x3, cooperative. Pleasant.Globally Non-toxic HEENT: Normocephalic and atraumatic. Throat clear, w/o exudate, R TM clear, L TM - good landmarks, No fluid present.  rhinnorhea. No frontal or maxillary sinus T. MMM NECK: Anterior cervical  LAD is present, but mild CV: RRR, No M/G/R, cap refill <2 sec PULM: Breathing comfortably in no respiratory distress. no wheezing, crackles, rhonchi ABD: S,NT,ND,+BS. No HSM. No rebound. EXT: No c/c/e PSYCH: Friendly, good eye contact MSK: Nml gait    Laboratory and Imaging Data: Results for orders placed in visit on 09/09/13  POCT RAPID STREP A (OFFICE)      Result Value Range   Rapid Strep A Screen Negative  Negative     Assessment & Plan:    Sore throat - Plan: POCT rapid strep A, Throat culture ([facility])  Viral syndrome  Multiple drug allergies, initial encounter  Very significant sore throat. Suspect it's more likely viral with a negative rapid strep test, lamina check a throat culture. I reviewed all this with the patient, and suggested various lengths for supportive care including Magic mouthwash and cough medications.  It strep test comes back positive, we'll call her in antibiotics over the weekend.  Patient Instructions  We will let you know about throat culture when it comes back  SORE THROAT -Most caused by infections, 80-85% are viral injections.  -Strep throat is bacterial and requires antibiotics -Drainage and cough can irritate throat  TREATMENT 1. Warm liquids, salt water gargles to help with the sore throat. 2. Chloraseptic as needed can help a lot 3. Cough drops, popsicles, or hard  candy 4. Liquids - drink plenty, without caffeine 5. Salt water gargle: 1/2 tsp salt in 1/2 glass warm water 6. Avoid spicy food 7. Get plenty of sleep 8. Ice chips for comfort        Orders Today:  Orders Placed This Encounter  Procedures  . Throat culture [facility])  . POCT rapid strep A    New medications, updates to list, dose adjustments: Meds ordered this encounter  Medications  . Alum & Mag Hydroxide-Simeth (MAGIC MOUTHWASH W/LIDOCAINE) SOLN    Sig: Take 5 mLs by mouth 4 (four)  times daily as needed for mouth pain.    Dispense:  240 mL    Refill:  0  . HYDROcodone-homatropine (HYCODAN) 5-1.5 MG/5ML syrup    Sig: 1/2 - 1 tsp po at night before bed prn cough    Dispense:  240 mL    Refill:  0    Signed,  Edgardo Petrenko T. Cyree Chuong, MD, CAQ Sports Medicine  [facility] [address] [place] [address] Phone: [phone] Fax: [phone]  Updated Complete Medication List:   Medication List       This list is accurate as of: 09/09/13 11:59 PM.  Always use your most recent med list.               bisoprolol 5 MG tablet  Commonly known as:  ZEBETA  Take 1 tablet (5 mg total) by mouth daily.     HYDROcodone-homatropine 5-1.5 MG/5ML syrup  Commonly known as:  HYCODAN  1/2 - 1 tsp po at night before bed prn cough     magic mouthwash w/lidocaine Soln  Take 5 mLs by mouth 4 (four) times daily as needed for mouth pain.     omeprazole 40 MG capsule  Commonly known as:  PRILOSEC  Take 40 mg by mouth 2 (two) times daily.     sucralfate 1 GM/10ML suspension  Commonly known as:  CARAFATE  Take 1 g by mouth 2 (two) times daily.

## 2013-09-09 NOTE — Patient Instructions (Addendum)
We will let you know about throat culture when it comes back  SORE THROAT -Most caused by infections, 80-85% are viral injections.  -Strep throat is bacterial and requires antibiotics -Drainage and cough can irritate throat  TREATMENT 1. Warm liquids, salt water gargles to help with the sore throat. 2. Chloraseptic as needed can help a lot 3. Cough drops, popsicles, or hard candy 4. Liquids - drink plenty, without caffeine 5. Salt water gargle: 1/2 tsp salt in 1/2 glass warm water 6. Avoid spicy food 7. Get plenty of sleep 8. Ice chips for comfort

## 2013-09-11 LAB — CULTURE, GROUP A STREP: Organism ID, Bacteria: NORMAL

## 2013-10-12 ENCOUNTER — Encounter: Payer: Self-pay | Admitting: Internal Medicine

## 2013-10-12 ENCOUNTER — Ambulatory Visit (INDEPENDENT_AMBULATORY_CARE_PROVIDER_SITE_OTHER): Payer: BC Managed Care – PPO | Admitting: Internal Medicine

## 2013-10-12 VITALS — BP 128/80 | HR 81 | Temp 98.1°F | Wt 193.0 lb

## 2013-10-12 DIAGNOSIS — J45901 Unspecified asthma with (acute) exacerbation: Secondary | ICD-10-CM

## 2013-10-12 MED ORDER — ALBUTEROL SULFATE HFA 108 (90 BASE) MCG/ACT IN AERS: 2.0000 | INHALATION_SPRAY | Freq: Four times a day (QID) | RESPIRATORY_TRACT | Status: DC | PRN

## 2013-10-12 MED ORDER — DOXYCYCLINE MONOHYDRATE 100 MG PO TABS: 100.0000 mg | ORAL_TABLET | Freq: Two times a day (BID) | ORAL | Status: DC

## 2013-10-12 MED ORDER — PREDNISONE 20 MG PO TABS: 40.0000 mg | ORAL_TABLET | Freq: Every day | ORAL | Status: DC

## 2013-10-12 NOTE — Assessment & Plan Note (Signed)
Has had persistent cough for a month Worse in the cold Some wheezing--especially in bed Spirometry is normal May be sequelae of infection though not clear cut  Will treat with doxy Prednisone burst Albuterol inhaler for prn

## 2013-10-12 NOTE — Progress Notes (Signed)
Subjective:    Patient ID: Yvonne Stephenson, female    DOB: 1966-08-31, 47 y.o.   MRN: [id number]  HPI Never had cough go away after last month's visit Sore throat is better Used son's nebulizer yesterday and it helped Having SOB---especially when lying down Some wheezing  No fever Cough is dry Some nasal drainage last week---better now Ears stuffy--benedryl helped some  No other meds for this  Current Outpatient Prescriptions on File Prior to Visit  Medication Sig Dispense Refill  . Alum & Mag Hydroxide-Simeth (MAGIC MOUTHWASH W/LIDOCAINE) SOLN Take 5 mLs by mouth 4 (four) times daily as needed for mouth pain.  240 mL  0  . bisoprolol (ZEBETA) 5 MG tablet Take 1 tablet (5 mg total) by mouth daily.  90 tablet  3  . HYDROcodone-homatropine (HYCODAN) 5-1.5 MG/5ML syrup 1/2 - 1 tsp po at night before bed prn cough  240 mL  0  . omeprazole (PRILOSEC) 40 MG capsule Take 40 mg by mouth 2 (two) times daily.      . sucralfate (CARAFATE) 1 GM/10ML suspension Take 1 g by mouth 2 (two) times daily.       No current facility-administered medications on file prior to visit.    Allergies  Allergen Reactions  . Penicillins Shortness Of Breath    REACTION: difficulty breathing  . Amoxicillin-Pot Clavulanate Swelling    Tongue swelling  . Atorvastatin     REACTION: hip pain  . Cefprozil     REACTION: rash  . Demerol Nausea And Vomiting  . Dilaudid [Hydromorphone Hcl] Nausea And Vomiting  . Meloxicam [Meloxicam] Swelling    Face swelling and lip tingling  . Zithromax [Azithromycin]     REACTION: tongue felt funny    Past Medical History  Diagnosis Date  . Anxiety   . GERD (gastroesophageal reflux disease)   . Hyperlipidemia   . Hypertension   . Asthma     only when gets a cold-expired inhaler  . PONV (postoperative nausea and vomiting)     Past Surgical History  Procedure Laterality Date  . Nasal stenosis repair  2003   . Breast surgery  2004    LUMPECTOMY  . Cesarean  section      3  . Tubal ligation    . Abdominal hysterectomy  07-2006  . Carpal tunnel release  4-10    LEFT (Dr. Brynda Greathouse)  . Laparoscopy  04/09/2012    Procedure: LAPAROSCOPY OPERATIVE;  Surgeon: Reva Bores, MD;  Location: [facility];  Service: Gynecology;  Laterality: N/A;  . Salpingoophorectomy  04/09/2012    Procedure: SALPINGO OOPHERECTOMY;  Surgeon: Reva Bores, MD;  Location: [facility];  Service: Gynecology;  Laterality: Right;    Family History  Problem Relation Age of Onset  . Heart attack Mother   . Cancer Mother     lung  . Heart disease Mother   . Hypertension Mother   . Stroke Mother   . Heart attack Father   . Heart disease Father   . Hypertension Father   . Diabetes Maternal Uncle   . Cancer Maternal Grandmother   . Heart failure Maternal Grandfather   . Heart failure Paternal Grandmother     History   Social History  . Marital Status: Married    Spouse Name: N/A    Number of Children: 3  . Years of Education: N/A   Occupational History  . 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  Social History Main Topics  . Smoking status: Never Smoker   . Smokeless tobacco: Never Used  . Alcohol Use: Yes     Comment: occasionaly  . Drug Use: No  . Sexual Activity: No   Other Topics Concern  . Not on file   Social History Narrative   Does walk a little for exercise   Review of Systems No rash Works outside twice a day at [employer] her breathing No vomiting or diarrhea    Objective:   Physical Exam  Constitutional: She appears well-developed and well-nourished. No distress.  Frequent coarse cough  HENT:  Mouth/Throat: Oropharynx is clear and moist. No oropharyngeal exudate.  Moderate nasal congestion Slight frontal tenderness (headache from the cough) TMs normal  Neck: Normal range of motion. Neck supple. No thyromegaly present.  Pulmonary/Chest: Effort normal and breath sounds normal. No respiratory distress. She has  no wheezes. She has no rales.  Lymphadenopathy:    She has no cervical adenopathy.          Assessment & Plan:

## 2013-10-12 NOTE — Progress Notes (Signed)
Pre-visit discussion using our clinic review tool. No additional management support is needed unless otherwise documented below in the visit note.  

## 2013-10-22 ENCOUNTER — Ambulatory Visit (INDEPENDENT_AMBULATORY_CARE_PROVIDER_SITE_OTHER): Payer: BC Managed Care – PPO | Admitting: Internal Medicine

## 2013-10-22 ENCOUNTER — Encounter: Payer: Self-pay | Admitting: Internal Medicine

## 2013-10-22 VITALS — BP 120/80 | HR 69 | Temp 98.4°F | Ht 61.0 in | Wt 193.0 lb

## 2013-10-22 DIAGNOSIS — K219 Gastro-esophageal reflux disease without esophagitis: Secondary | ICD-10-CM

## 2013-10-22 DIAGNOSIS — Z0000 Encounter for general adult medical examination without abnormal findings: Secondary | ICD-10-CM

## 2013-10-22 DIAGNOSIS — I10 Essential (primary) hypertension: Secondary | ICD-10-CM

## 2013-10-22 DIAGNOSIS — E781 Pure hyperglyceridemia: Secondary | ICD-10-CM

## 2013-10-22 LAB — CBC WITH DIFFERENTIAL/PLATELET
Basophils Absolute: 0 10*3/uL (ref 0.0–0.1)
Basophils Relative: 0.2 % (ref 0.0–3.0)
Eosinophils Absolute: 0.3 10*3/uL (ref 0.0–0.7)
Eosinophils Relative: 2.6 % (ref 0.0–5.0)
HCT: 42.9 % (ref 36.0–46.0)
Hemoglobin: 14.3 g/dL (ref 12.0–15.0)
Lymphocytes Relative: 40 % (ref 12.0–46.0)
Lymphs Abs: 4.2 10*3/uL — ABNORMAL HIGH (ref 0.7–4.0)
MCHC: 33.4 g/dL (ref 30.0–36.0)
MCV: 81.5 fl (ref 78.0–100.0)
Monocytes Absolute: 0.5 10*3/uL (ref 0.1–1.0)
Monocytes Relative: 5.1 % (ref 3.0–12.0)
Neutro Abs: 5.5 10*3/uL (ref 1.4–7.7)
Neutrophils Relative %: 52.1 % (ref 43.0–77.0)
Platelets: 220 10*3/uL (ref 150.0–400.0)
RBC: 5.26 Mil/uL — ABNORMAL HIGH (ref 3.87–5.11)
RDW: 13.8 % (ref 11.5–14.6)
WBC: 10.5 10*3/uL (ref 4.5–10.5)

## 2013-10-22 LAB — HEPATIC FUNCTION PANEL
ALT: 54 U/L — ABNORMAL HIGH (ref 0–35)
AST: 30 U/L (ref 0–37)
Albumin: 4.1 g/dL (ref 3.5–5.2)
Alkaline Phosphatase: 61 U/L (ref 39–117)
Bilirubin, Direct: 0 mg/dL (ref 0.0–0.3)
Total Bilirubin: 0.4 mg/dL (ref 0.3–1.2)
Total Protein: 7.2 g/dL (ref 6.0–8.3)

## 2013-10-22 LAB — BASIC METABOLIC PANEL
BUN: 15 mg/dL (ref 6–23)
CO2: 30 mEq/L (ref 19–32)
Calcium: 9.3 mg/dL (ref 8.4–10.5)
Chloride: 101 mEq/L (ref 96–112)
Creatinine, Ser: 0.8 mg/dL (ref 0.4–1.2)
GFR: 76.92 mL/min (ref >60.00)
Glucose, Bld: 83 mg/dL (ref 70–99)
Potassium: 4.1 mEq/L (ref 3.5–5.1)
Sodium: 137 mEq/L (ref 135–145)

## 2013-10-22 LAB — TSH: TSH: 0.21 u[IU]/mL — ABNORMAL LOW (ref 0.35–5.50)

## 2013-10-22 NOTE — Assessment & Plan Note (Signed)
Healthy Discussed fitness and proper eating---info given Due for mammo--she will set up

## 2013-10-22 NOTE — Progress Notes (Signed)
Subjective:    Patient ID: Yvonne Stephenson, female    DOB: [birth year], 47 y.o.   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 albuterol (PROVENTIL HFA;VENTOLIN HFA) 108 (90 BASE) MCG/ACT inhaler Inhale 2 puffs into the lungs every 6 (six) hours as needed for wheezing or shortness of breath.  1 Inhaler  1  . bisoprolol (ZEBETA) 5 MG tablet Take 1 tablet (5 mg total) by mouth daily.  90 tablet  3  . doxycycline (ADOXA) 100 MG tablet Take 1 tablet (100 mg total) by mouth 2 (two) times daily.  20 tablet  0  . HYDROcodone-homatropine (HYCODAN) 5-1.5 MG/5ML syrup 1/2 - 1 tsp po at night before bed prn cough  240 mL  0  . omeprazole (PRILOSEC) 40 MG capsule Take 40 mg by mouth daily.       . sucralfate (CARAFATE) 1 GM/10ML suspension Take 1 g by mouth 2 (two) times daily.       No current facility-administered medications on file prior to visit.    Allergies  Allergen Reactions  . Penicillins Shortness Of Breath    REACTION: difficulty breathing  . Amoxicillin-Pot Clavulanate Swelling    Tongue swelling  . Atorvastatin     REACTION: hip pain  . Cefprozil     REACTION: rash  . Demerol Nausea And Vomiting  . Dilaudid [Hydromorphone Hcl] Nausea And Vomiting  . Meloxicam [Meloxicam] Swelling    Face swelling and lip tingling  . Zithromax [Azithromycin]     REACTION: tongue felt funny    Past Medical History  Diagnosis Date  . Anxiety   . GERD (gastroesophageal reflux disease)   . Hyperlipidemia   . Hypertension   . Asthma     only when gets a cold-expired inhaler  . PONV (postoperative nausea and vomiting)     Past Surgical History  Procedure Laterality Date  . Nasal  stenosis repair  2003   . Breast surgery  2004    LUMPECTOMY  . Cesarean section      3  . Tubal ligation    . Abdominal hysterectomy  07-2006  . Carpal tunnel release  4-10    LEFT (Dr. Brynda Greathouse)  . Laparoscopy  04/09/2012    Procedure: LAPAROSCOPY OPERATIVE;  Surgeon: Reva Bores, MD;  Location: [facility];  Service: Gynecology;  Laterality: N/A;  . Salpingoophorectomy  04/09/2012    Procedure: SALPINGO OOPHERECTOMY;  Surgeon: Reva Bores, MD;  Location: [facility];  Service: Gynecology;  Laterality: Right;    Family History  Problem Relation Age of Onset  . Heart attack Mother   . Cancer Mother     lung  . Heart disease Mother   . Hypertension Mother   . Stroke Mother   . Heart attack Father   . Heart disease Father   . Hypertension Father   . Diabetes Maternal Uncle   . Cancer Maternal Grandmother   . Heart failure Maternal Grandfather   . Heart failure Paternal Grandmother     History   Social History  . Marital Status: Married    Spouse Name: N/A    Number of Children: 3  . Years of Education:  N/A   Occupational History  . 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      Social History Main Topics  . Smoking status: Never Smoker   . Smokeless tobacco: Never Used  . Alcohol Use: Yes     Comment: occasionaly  . Drug Use: No  . Sexual Activity: No   Other Topics Concern  . Not on file   Social History Narrative   Does walk a little for exercise   Review of Systems  Constitutional: Negative for fatigue and unexpected weight change.       Wears seat belt  HENT: Negative for dental problem, hearing loss and tinnitus.        Regular with dentist  Eyes: Negative for visual disturbance.       No diplopia or unilateral vision loss  Respiratory: Negative for chest tightness and shortness of breath.   Cardiovascular: Positive for palpitations. Negative for chest pain and leg swelling.       Only gets palpitations if she has more  coffee Swelling is gone since she had back injection  Gastrointestinal: Negative for nausea, vomiting, abdominal pain, constipation and blood in stool.       Heartburn controlled on the omeprazole   Endocrine: Negative for cold intolerance and heat intolerance.  Genitourinary: Negative for dysuria, hematuria, difficulty urinating and dyspareunia.  Musculoskeletal: Negative for arthralgias, back pain and joint swelling.  Skin: Negative for rash.       No suspicious lesions  Allergic/Immunologic: Negative for environmental allergies and immunocompromised state.  Neurological: Positive for headaches. Negative for dizziness, syncope, weakness and light-headedness.       Rare headaches Hands fall asleep at night---able to shake them out easily  Hematological: Negative for adenopathy. Does not bruise/bleed easily.  Psychiatric/Behavioral: Negative for sleep disturbance and dysphoric mood. The patient is not nervous/anxious.        Objective:   Physical Exam  Constitutional: She is oriented to person, place, and time. She appears well-developed and well-nourished. No distress.  HENT:  Head: Normocephalic and atraumatic.  Right Ear: External ear normal.  Left Ear: External ear normal.  Mouth/Throat: Oropharynx is clear and moist. No oropharyngeal exudate.  Eyes: Conjunctivae and EOM are normal. Pupils are equal, round, and reactive to light.  Neck: Normal range of motion. Neck supple. No thyromegaly present.  Cardiovascular: Normal rate, regular rhythm, normal heart sounds and intact distal pulses.  Exam reveals no gallop.   No murmur heard. Pulmonary/Chest: Effort normal and breath sounds normal. No respiratory distress. She has no wheezes. She has no rales.  Abdominal: Soft. There is no tenderness.  Musculoskeletal: She exhibits no edema and no tenderness.  Lymphadenopathy:    She has no cervical adenopathy.  Neurological: She is alert and oriented to person, place, and time.  Skin: No  rash noted. No erythema.  Psychiatric: She has a normal mood and affect. Her behavior is normal.          Assessment & Plan:

## 2013-10-22 NOTE — Assessment & Plan Note (Signed)
Controlled with meds

## 2013-10-22 NOTE — Assessment & Plan Note (Signed)
BP Readings from Last 3 Encounters:  10/22/13 120/80  10/12/13 128/80  09/09/13 128/90   Good control No changes needed

## 2013-10-22 NOTE — Patient Instructions (Addendum)
Please set up your screening mammogram.  DASH Diet The DASH diet stands for "Dietary Approaches to Stop Hypertension." It is a healthy eating plan that has been shown to reduce high blood pressure (hypertension) in as little as 14 days, while also possibly providing other significant health benefits. These other health benefits include reducing the risk of breast cancer after menopause and reducing the risk of type 2 diabetes, heart disease, colon cancer, and stroke. Health benefits also include weight loss and slowing kidney failure in patients with chronic kidney disease.  DIET GUIDELINES  Limit salt (sodium). Your diet should contain less than 1500 mg of sodium daily.  Limit refined or processed carbohydrates. Your diet should include mostly whole grains. Desserts and added sugars should be used sparingly.  Include small amounts of heart-healthy fats. These types of fats include nuts, oils, and tub margarine. Limit saturated and trans fats. These fats have been shown to be harmful in the body. CHOOSING FOODS  The following food groups are based on a 2000 calorie diet. See your Registered Dietitian for individual calorie needs. Grains and Grain Products (6 to 8 servings daily)  Eat More Often: Whole-wheat bread, brown rice, whole-grain or wheat pasta, quinoa, popcorn without added fat or salt (air popped).  Eat Less Often: White bread, white pasta, white rice, cornbread. Vegetables (4 to 5 servings daily)  Eat More Often: Fresh, frozen, and canned vegetables. Vegetables may be raw, steamed, roasted, or grilled with a minimal amount of fat.  Eat Less Often/Avoid: Creamed or fried vegetables. Vegetables in a cheese sauce. Fruit (4 to 5 servings daily)  Eat More Often: All fresh, canned (in natural juice), or frozen fruits. Dried fruits without added sugar. One hundred percent fruit juice ( cup [237 mL] daily).  Eat Less Often: Dried fruits with added sugar. Canned fruit in light or heavy  syrup. Lean Meats, Fish, and Poultry (2 servings or less daily. One serving is 3 to 4 oz [85-114 g]).  Eat More Often: Ninety percent or leaner ground beef, tenderloin, sirloin. Round cuts of beef, chicken breast, turkey breast. All fish. Grill, bake, or broil your meat. Nothing should be fried.  Eat Less Often/Avoid: Fatty cuts of meat, turkey, or chicken leg, thigh, or wing. Fried cuts of meat or fish. Dairy (2 to 3 servings)  Eat More Often: Low-fat or fat-free milk, low-fat plain or light yogurt, reduced-fat or part-skim cheese.  Eat Less Often/Avoid: Milk (whole, 2%).Whole milk yogurt. Full-fat cheeses. Nuts, Seeds, and Legumes (4 to 5 servings per week)  Eat More Often: All without added salt.  Eat Less Often/Avoid: Salted nuts and seeds, canned beans with added salt. Fats and Sweets (limited)  Eat More Often: Vegetable oils, tub margarines without trans fats, sugar-free gelatin. Mayonnaise and salad dressings.  Eat Less Often/Avoid: Coconut oils, palm oils, butter, stick margarine, cream, half and half, cookies, candy, pie. FOR MORE INFORMATION The Dash Diet Eating Plan: www.dashdiet.org Document Released: 10/03/2011 Document Revised: 01/06/2012 Document Reviewed: 10/03/2011 Exit[employer] Patient Information 2014 [employer]. Exercise to Lose Weight Exercise and a healthy diet may help you lose weight. Your doctor may suggest specific exercises. EXERCISE IDEAS AND TIPS  Choose low-cost things you enjoy doing, such as walking, bicycling, or exercising to workout videos.  Take stairs instead of the elevator.  Walk during your lunch break.  Park your car further away from work or school.  Go to a gym or an exercise class.  Start with 5 to 10 minutes of exercise   each day. Build up to 30 minutes of exercise 4 to 6 days a week.  Wear shoes with good support and comfortable clothes.  Stretch before and after working out.  Work out until you breathe harder and your heart  beats faster.  Drink extra water when you exercise.  Do not do so much that you hurt yourself, feel dizzy, or get very short of breath. Exercises that burn about 150 calories:  Running 1  miles in 15 minutes.  Playing volleyball for 45 to 60 minutes.  Washing and waxing a car for 45 to 60 minutes.  Playing touch football for 45 minutes.  Walking 1  miles in 35 minutes.  Pushing a stroller 1  miles in 30 minutes.  Playing basketball for 30 minutes.  Raking leaves for 30 minutes.  Bicycling 5 miles in 30 minutes.  Walking 2 miles in 30 minutes.  Dancing for 30 minutes.  Shoveling snow for 15 minutes.  Swimming laps for 20 minutes.  Walking up stairs for 15 minutes.  Bicycling 4 miles in 15 minutes.  Gardening for 30 to 45 minutes.  Jumping rope for 15 minutes.  Washing windows or floors for 45 to 60 minutes. Document Released: 11/16/2010 Document Revised: 01/06/2012 Document Reviewed: 11/16/2010 [employer] Patient Information 2014 [employer].  

## 2013-10-22 NOTE — Assessment & Plan Note (Signed)
Discussed lifestyle.

## 2013-11-12 ENCOUNTER — Other Ambulatory Visit: Payer: Self-pay | Admitting: Internal Medicine

## 2013-11-23 ENCOUNTER — Other Ambulatory Visit: Payer: Self-pay | Admitting: Internal Medicine

## 2013-11-23 DIAGNOSIS — R7989 Other specified abnormal findings of blood chemistry: Secondary | ICD-10-CM

## 2013-11-25 ENCOUNTER — Ambulatory Visit (INDEPENDENT_AMBULATORY_CARE_PROVIDER_SITE_OTHER): Payer: BC Managed Care – PPO | Admitting: Internal Medicine

## 2013-11-25 ENCOUNTER — Encounter: Payer: Self-pay | Admitting: *Deleted

## 2013-11-25 ENCOUNTER — Encounter: Payer: Self-pay | Admitting: Internal Medicine

## 2013-11-25 VITALS — BP 130/80 | HR 93 | Temp 98.5°F | Wt 183.0 lb

## 2013-11-25 DIAGNOSIS — J019 Acute sinusitis, unspecified: Secondary | ICD-10-CM

## 2013-11-25 DIAGNOSIS — R946 Abnormal results of thyroid function studies: Secondary | ICD-10-CM

## 2013-11-25 DIAGNOSIS — J0140 Acute pansinusitis, unspecified: Secondary | ICD-10-CM | POA: Insufficient documentation

## 2013-11-25 MED ORDER — CLINDAMYCIN HCL 300 MG PO CAPS: 300.0000 mg | ORAL_CAPSULE | Freq: Three times a day (TID) | ORAL | Status: DC

## 2013-11-25 NOTE — Progress Notes (Signed)
Pre-visit discussion using our clinic review tool. No additional management support is needed unless otherwise documented below in the visit note.  

## 2013-11-25 NOTE — Assessment & Plan Note (Signed)
Seems to have an acute viral syndrome--maybe even the flu Now with sinus problems  Discussed supportive care Will give Rx for clinda for if she worsens

## 2013-11-25 NOTE — Progress Notes (Signed)
Subjective:    Patient ID: Yvonne Stephenson, female    DOB: 1966/04/08, 48 y.o.   MRN: [id number]  HPI Here with husband  "I am miserable" Started with chills 3 days ago 101-103 fever Shaking and couldn't get warm Not really achy  Now coughing bad---started yesterday Cough is dry  Head congestion and sinus pressure No drainage Ears are stuffed Some sore throat  Taking motrin 800 tid Tried nyquil cold and flu  Current Outpatient Prescriptions on File Prior to Visit  Medication Sig Dispense Refill  . albuterol (PROVENTIL HFA;VENTOLIN HFA) 108 (90 BASE) MCG/ACT inhaler Inhale 2 puffs into the lungs every 6 (six) hours as needed for wheezing or shortness of breath.  1 Inhaler  1  . bisoprolol (ZEBETA) 5 MG tablet TAKE 1 TABLET (5 MG TOTAL) BY MOUTH DAILY.  90 tablet  2  . omeprazole (PRILOSEC) 40 MG capsule Take 40 mg by mouth daily.       . sucralfate (CARAFATE) 1 GM/10ML suspension Take 1 g by mouth 2 (two) times daily.       No current facility-administered medications on file prior to visit.    Allergies  Allergen Reactions  . Penicillins Shortness Of Breath    REACTION: difficulty breathing  . Amoxicillin-Pot Clavulanate Swelling    Tongue swelling  . Atorvastatin     REACTION: hip pain  . Cefprozil     REACTION: rash  . Demerol Nausea And Vomiting  . Dilaudid [Hydromorphone Hcl] Nausea And Vomiting  . Meloxicam [Meloxicam] Swelling    Face swelling and lip tingling  . Zithromax [Azithromycin]     REACTION: tongue felt funny    Past Medical History  Diagnosis Date  . Anxiety   . GERD (gastroesophageal reflux disease)   . Hyperlipidemia   . Hypertension   . Asthma     only when gets a cold-expired inhaler  . PONV (postoperative nausea and vomiting)     Past Surgical History  Procedure Laterality Date  . Nasal stenosis repair  2003   . Breast surgery  2004    LUMPECTOMY  . Cesarean section      3  . Tubal ligation    . Abdominal hysterectomy   07-2006  . Carpal tunnel release  4-10    LEFT (Dr. Tommie Raymond)  . Laparoscopy  04/09/2012    Procedure: LAPAROSCOPY OPERATIVE;  Surgeon: Donnamae Jude, MD;  Location: [facility];  Service: Gynecology;  Laterality: N/A;  . Salpingoophorectomy  04/09/2012    Procedure: SALPINGO OOPHERECTOMY;  Surgeon: Donnamae Jude, MD;  Location: [facility];  Service: Gynecology;  Laterality: Right;    Family History  Problem Relation Age of Onset  . Heart attack Mother   . Cancer Mother     lung  . Heart disease Mother   . Hypertension Mother   . Stroke Mother   . Heart attack Father   . Heart disease Father   . Hypertension Father   . Diabetes Maternal Uncle   . Cancer Maternal Grandmother   . Heart failure Maternal Grandfather   . Heart failure Paternal Grandmother     History   Social History  . Marital Status: Married    Spouse Name: N/A    Number of Children: 3  . Years of Education: N/A   Occupational History  . 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      Social History Main Topics  . Smoking  status: Never Smoker   . Smokeless tobacco: Never Used  . Alcohol Use: Yes     Comment: occasionaly  . Drug Use: No  . Sexual Activity: No   Other Topics Concern  . Not on file   Social History Narrative   Does walk a little for exercise   Review of Systems No rash No vomiting or diarrhea Appetite is off    Objective:   Physical Exam  Constitutional: She appears well-developed. No distress.  Uncomfortable but not distressed  HENT:  Mild frontal tenderness Marked nasal inflammation Mild pharyngeal injection without exudates TMs normal  Neck: Normal range of motion. Neck supple.  Bilateral non tender anterior cervical nodes  Pulmonary/Chest: Effort normal and breath sounds normal. No respiratory distress. She has no wheezes. She has no rales.  Skin: No rash noted.          Assessment & Plan:

## 2013-11-25 NOTE — Patient Instructions (Signed)
Please start the clindamycin if your sinus symptoms worsen----like more cough and drainage, or just not getting better by next week.

## 2013-11-26 ENCOUNTER — Other Ambulatory Visit: Payer: BC Managed Care – PPO

## 2013-11-26 LAB — T4, FREE: Free T4: 0.63 ng/dL (ref 0.60–1.60)

## 2013-11-26 LAB — TSH: TSH: 2.87 u[IU]/mL (ref 0.35–5.50)

## 2013-12-01 ENCOUNTER — Telehealth: Payer: Self-pay

## 2013-12-01 NOTE — Telephone Encounter (Signed)
Pt could not get into mychart for 11/25/13 lab results. Pt notified as instructed from result note.

## 2014-01-03 ENCOUNTER — Other Ambulatory Visit: Payer: Self-pay | Admitting: Orthopedic Surgery

## 2014-01-03 DIAGNOSIS — M25511 Pain in right shoulder: Secondary | ICD-10-CM

## 2014-01-09 ENCOUNTER — Ambulatory Visit
Admission: RE | Admit: 2014-01-09 | Discharge: 2014-01-09 | Disposition: A | Discharge status: Home / Self Care | Payer: BC Managed Care – PPO | Source: Ambulatory Visit | Attending: Orthopedic Surgery | Admitting: Orthopedic Surgery

## 2014-01-09 DIAGNOSIS — M25511 Pain in right shoulder: Secondary | ICD-10-CM

## 2014-01-18 ENCOUNTER — Other Ambulatory Visit: Payer: Self-pay | Admitting: Internal Medicine

## 2014-06-20 ENCOUNTER — Other Ambulatory Visit: Payer: Self-pay | Admitting: Internal Medicine

## 2014-07-25 ENCOUNTER — Encounter: Payer: Self-pay | Admitting: Internal Medicine

## 2014-07-25 ENCOUNTER — Telehealth: Payer: Self-pay | Admitting: *Deleted

## 2014-07-25 ENCOUNTER — Ambulatory Visit (INDEPENDENT_AMBULATORY_CARE_PROVIDER_SITE_OTHER): Payer: BC Managed Care – PPO | Admitting: Internal Medicine

## 2014-07-25 VITALS — BP 100/70 | HR 81 | Temp 98.5°F | Wt 179.5 lb

## 2014-07-25 DIAGNOSIS — J209 Acute bronchitis, unspecified: Secondary | ICD-10-CM

## 2014-07-25 MED ORDER — SPACER/AERO CHAMBER MOUTHPIECE MISC: Status: DC

## 2014-07-25 MED ORDER — ALBUTEROL SULFATE HFA 108 (90 BASE) MCG/ACT IN AERS: 2.0000 | INHALATION_SPRAY | Freq: Three times a day (TID) | RESPIRATORY_TRACT | Status: DC | PRN

## 2014-07-25 NOTE — Assessment & Plan Note (Signed)
Seems to be viral Discussed supportive care Will renew the pro air as she has tight feeling that albuterol helps She will call if she worsens (to consider empiric antibiotic)

## 2014-07-25 NOTE — Progress Notes (Signed)
Pre visit review using our clinic review tool, if applicable. No additional management support is needed unless otherwise documented below in the visit note. 

## 2014-07-25 NOTE — Progress Notes (Signed)
Subjective:    Patient ID: Yvonne Stephenson, female    DOB: Aug 05, 1966, 48 y.o.   MRN: [id number]  HPI Started with cough 3 days ago Now noticed tightness in chest--like if going up steps. Trouble breathing Feels "blah" but no fever Cough generally non productive Slight wheeze when lying down  Did lawn with son--- may have started after that (had been dusty) Exposure to sick kids in school  Some sinus pressure Left ear hurt recently --seemed better after benedryl No sore throat  No sinus drainage or PND  Tried neb Rx and it seemed to help some  Current Outpatient Prescriptions on File Prior to Visit  Medication Sig Dispense Refill  . bisoprolol (ZEBETA) 5 MG tablet TAKE 1 TABLET (5 MG TOTAL) BY MOUTH DAILY.  90 tablet  2  . omeprazole (PRILOSEC) 40 MG capsule Take 40 mg by mouth daily.       . sucralfate (CARAFATE) 1 GM/10ML suspension Take 1 g by mouth 2 (two) times daily.      . clindamycin (CLEOCIN) 300 MG capsule Take 1 capsule (300 mg total) by mouth 3 (three) times daily.  30 capsule  0  . PROAIR HFA 108 (90 BASE) MCG/ACT inhaler INHALE 2 PUFFS EVERY 6 HOURS AS NEEDED FOR WHEEZING OR SHORTNESS OF BREATH  8.5 each  1   No current facility-administered medications on file prior to visit.    Allergies  Allergen Reactions  . Penicillins Shortness Of Breath    REACTION: difficulty breathing  . Amoxicillin-Pot Clavulanate Swelling    Tongue swelling  . Atorvastatin     REACTION: hip pain  . Cefprozil     REACTION: rash  . Demerol Nausea And Vomiting  . Dilaudid [Hydromorphone Hcl] Nausea And Vomiting  . Meloxicam [Meloxicam] Swelling    Face swelling and lip tingling  . Zithromax [Azithromycin]     REACTION: tongue felt funny    Past Medical History  Diagnosis Date  . Anxiety   . GERD (gastroesophageal reflux disease)   . Hyperlipidemia   . Hypertension   . Asthma     only when gets a cold-expired inhaler  . PONV (postoperative nausea and vomiting)      Past Surgical History  Procedure Laterality Date  . Nasal stenosis repair  2003   . Breast surgery  2004    LUMPECTOMY  . Cesarean section      3  . Tubal ligation    . Abdominal hysterectomy  07-2006  . Carpal tunnel release  4-10    LEFT (Dr. Tommie Raymond)  . Laparoscopy  04/09/2012    Procedure: LAPAROSCOPY OPERATIVE;  Surgeon: Donnamae Jude, MD;  Location: [facility];  Service: Gynecology;  Laterality: N/A;  . Salpingoophorectomy  04/09/2012    Procedure: SALPINGO OOPHERECTOMY;  Surgeon: Donnamae Jude, MD;  Location: [facility];  Service: Gynecology;  Laterality: Right;    Family History  Problem Relation Age of Onset  . Heart attack Mother   . Cancer Mother     lung  . Heart disease Mother   . Hypertension Mother   . Stroke Mother   . Heart attack Father   . Heart disease Father   . Hypertension Father   . Diabetes Maternal Uncle   . Cancer Maternal Grandmother   . Heart failure Maternal Grandfather   . Heart failure Paternal Grandmother     History   Social History  . Marital Status: Married    Spouse Name: N/A  Number of Children: 3  . 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      Social History Main Topics  . Smoking status: Never Smoker   . Smokeless tobacco: Never Used  . Alcohol Use: Yes     Comment: occasionaly  . Drug Use: No  . Sexual Activity: No   Other Topics Concern  . Not on file   Social History Narrative   Does walk a little for exercise   Review of Systems No rash Loose stools at first--better now No vomiting Appetite is off     Objective:   Physical Exam  Constitutional: She appears well-developed and well-nourished. No distress.  HENT:  Mouth/Throat: Oropharynx is clear and moist. No oropharyngeal exudate.  Mild frontal tenderness but no maxillary tenderness Moderate nasal congestion TMs normal  Neck: Normal range of motion. Neck supple.   Pulmonary/Chest: Effort normal and breath sounds normal. No respiratory distress. She has no wheezes. She has no rales.  Lymphadenopathy:    She has no cervical adenopathy.          Assessment & Plan:

## 2014-07-25 NOTE — Telephone Encounter (Signed)
Pt was seen today and advised to get a spacer for her inhaler. She was told at pharmacy that she needs an rx for it. I sent rx electronically.

## 2014-07-27 ENCOUNTER — Encounter: Payer: Self-pay | Admitting: Internal Medicine

## 2014-07-28 MED ORDER — CLINDAMYCIN HCL 300 MG PO CAPS: 300.0000 mg | ORAL_CAPSULE | Freq: Three times a day (TID) | ORAL | Status: DC

## 2014-08-15 ENCOUNTER — Other Ambulatory Visit: Payer: Self-pay

## 2014-08-15 DIAGNOSIS — Z1239 Encounter for other screening for malignant neoplasm of breast: Secondary | ICD-10-CM

## 2014-08-29 ENCOUNTER — Encounter: Payer: Self-pay | Admitting: Internal Medicine

## 2014-09-16 ENCOUNTER — Other Ambulatory Visit: Payer: Self-pay

## 2014-09-16 DIAGNOSIS — Z1231 Encounter for screening mammogram for malignant neoplasm of breast: Secondary | ICD-10-CM

## 2014-09-19 ENCOUNTER — Ambulatory Visit
Admission: RE | Admit: 2014-09-19 | Discharge: 2014-09-19 | Disposition: A | Discharge status: Home / Self Care | Payer: BC Managed Care – PPO | Source: Ambulatory Visit

## 2014-09-19 DIAGNOSIS — Z1231 Encounter for screening mammogram for malignant neoplasm of breast: Secondary | ICD-10-CM

## 2014-09-21 ENCOUNTER — Other Ambulatory Visit: Payer: Self-pay | Admitting: Obstetrics & Gynecology

## 2014-09-21 DIAGNOSIS — R928 Other abnormal and inconclusive findings on diagnostic imaging of breast: Secondary | ICD-10-CM

## 2014-09-28 ENCOUNTER — Inpatient Hospital Stay: Admission: RE | Admit: 2014-09-28 | Payer: BC Managed Care – PPO | Source: Ambulatory Visit

## 2014-10-04 ENCOUNTER — Ambulatory Visit
Admission: RE | Admit: 2014-10-04 | Discharge: 2014-10-04 | Disposition: A | Discharge status: Home / Self Care | Payer: BC Managed Care – PPO | Source: Ambulatory Visit | Attending: Obstetrics & Gynecology | Admitting: Obstetrics & Gynecology

## 2014-10-04 DIAGNOSIS — R928 Other abnormal and inconclusive findings on diagnostic imaging of breast: Secondary | ICD-10-CM

## 2014-10-28 HISTORY — PX: SHOULDER SURGERY: SHX246

## 2015-01-18 ENCOUNTER — Other Ambulatory Visit: Payer: Self-pay | Admitting: Internal Medicine

## 2015-03-15 ENCOUNTER — Encounter: Payer: Self-pay | Admitting: Internal Medicine

## 2015-03-15 ENCOUNTER — Ambulatory Visit (INDEPENDENT_AMBULATORY_CARE_PROVIDER_SITE_OTHER): Payer: BLUE CROSS/BLUE SHIELD | Admitting: Internal Medicine

## 2015-03-15 ENCOUNTER — Encounter: Payer: Self-pay | Admitting: *Deleted

## 2015-03-15 VITALS — BP 138/80 | HR 88 | Temp 98.4°F | Wt 199.0 lb

## 2015-03-15 DIAGNOSIS — J0100 Acute maxillary sinusitis, unspecified: Secondary | ICD-10-CM

## 2015-03-15 MED ORDER — SUCRALFATE 1 GM/10ML PO SUSP: 1.0000 g | Freq: Two times a day (BID) | ORAL | Status: DC

## 2015-03-15 NOTE — Patient Instructions (Signed)
Please restart a nasal cortisone spray---flonase or nasacort--which are over the counter. Start with 2 sprays in each nostril twice a day for 1-2 weeks, then decrease to once a day.

## 2015-03-15 NOTE — Progress Notes (Signed)
Subjective:    Patient ID: Yvonne Stephenson, female    DOB: 1966-09-05, 49 y.o.   MRN: [id number]  HPI Here due to respiratory illness Started with headache and then migraine about 4-5 days ago Then awoke all congested Now with cough and wheezing Cough is dry Some post nasal drip Maxillary pressure  No fever No chills but some sweat with the headache Slight DOE walking stairs today Left ear is clogged Sore throat at first  Taking motrin and tylenol sinus--no help Noone ill that she is exposed to  Current Outpatient Prescriptions on File Prior to Visit  Medication Sig Dispense Refill  . albuterol (PROAIR HFA) 108 (90 BASE) MCG/ACT inhaler Inhale 2 puffs into the lungs 3 (three) times daily as needed for wheezing or shortness of breath. 8.5 each 1  . bisoprolol (ZEBETA) 5 MG tablet TAKE 1 TABLET BY MOUTH EVERY DAY 90 tablet 2  . omeprazole (PRILOSEC) 40 MG capsule Take 40 mg by mouth daily.      No current facility-administered medications on file prior to visit.    Allergies  Allergen Reactions  . Penicillins Shortness Of Breath    REACTION: difficulty breathing  . Amoxicillin-Pot Clavulanate Swelling    Tongue swelling  . Atorvastatin     REACTION: hip pain  . Cefprozil     REACTION: rash  . Demerol Nausea And Vomiting  . Dilaudid [Hydromorphone Hcl] Nausea And Vomiting  . Meloxicam [Meloxicam] Swelling    Face swelling and lip tingling  . Zithromax [Azithromycin]     REACTION: tongue felt funny    Past Medical History  Diagnosis Date  . Anxiety   . GERD (gastroesophageal reflux disease)   . Hyperlipidemia   . Hypertension   . Asthma     only when gets a cold-expired inhaler  . PONV (postoperative nausea and vomiting)     Past Surgical History  Procedure Laterality Date  . Nasal stenosis repair  2003   . Breast surgery  2004    LUMPECTOMY  . Cesarean section      3  . Tubal ligation    . Abdominal hysterectomy  07-2006  . Carpal tunnel release   4-10    LEFT (Dr. Tommie Raymond)  . Laparoscopy  04/09/2012    Procedure: LAPAROSCOPY OPERATIVE;  Surgeon: Donnamae Jude, MD;  Location: [facility];  Service: Gynecology;  Laterality: N/A;  . Salpingoophorectomy  04/09/2012    Procedure: SALPINGO OOPHERECTOMY;  Surgeon: Donnamae Jude, MD;  Location: [facility];  Service: Gynecology;  Laterality: Right;    Family History  Problem Relation Age of Onset  . Heart attack Mother   . Cancer Mother     lung  . Heart disease Mother   . Hypertension Mother   . Stroke Mother   . Heart attack Father   . Heart disease Father   . Hypertension Father   . Diabetes Maternal Uncle   . Cancer Maternal Grandmother   . Heart failure Maternal Grandfather   . Heart failure Paternal Grandmother     History   Social History  . Marital Status: Married    Spouse Name: N/A  . Number of Children: 3  . 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      Social History Main Topics  . Smoking status: Never Smoker   . Smokeless tobacco: Never Used  . Alcohol Use:  Yes     Comment: occasionaly  . Drug Use: No  . Sexual Activity: No   Other Topics Concern  . Not on file   Social History Narrative   Does walk a little for exercise    Review of Systems No rash Queasy at first--no vomiting or diarrhea Appetite is okay    Objective:   Physical Exam  Constitutional: She appears well-developed and well-nourished. No distress.  HENT:  Mouth/Throat: Oropharynx is clear and moist. No oropharyngeal exudate.  TMs normal Moderate maxillary tenderness Polyp in left nare, ?on right also (lots of inflammation)  Neck: Normal range of motion. Neck supple. No thyromegaly present.  Pulmonary/Chest: Effort normal and breath sounds normal. No respiratory distress. She has no wheezes. She has no rales.  Lymphadenopathy:    She has no cervical adenopathy.  Skin: No rash noted.          Assessment &  Plan:

## 2015-03-15 NOTE — Assessment & Plan Note (Signed)
Probably viral Will restart nasal steroids given the polyps If worse next week, will send antibiotic prescription

## 2015-03-15 NOTE — Progress Notes (Signed)
Pre visit review using our clinic review tool, if applicable. No additional management support is needed unless otherwise documented below in the visit note. 

## 2015-04-14 ENCOUNTER — Telehealth: Payer: Self-pay

## 2015-04-14 NOTE — Telephone Encounter (Signed)
Pt left v/m that ins co is needing prior auth for carafate. Pt request cb.

## 2015-04-14 NOTE — Telephone Encounter (Signed)
Spoke with patient and advised this has been approved on 03/16/2015. Spoke with [facility] and they ran it thru and it was approved.

## 2015-05-03 ENCOUNTER — Other Ambulatory Visit: Payer: Self-pay | Admitting: Physical Medicine and Rehabilitation

## 2015-05-03 DIAGNOSIS — M542 Cervicalgia: Secondary | ICD-10-CM

## 2015-05-03 DIAGNOSIS — M545 Low back pain: Secondary | ICD-10-CM

## 2015-05-11 ENCOUNTER — Other Ambulatory Visit: Payer: BLUE CROSS/BLUE SHIELD

## 2015-05-17 ENCOUNTER — Ambulatory Visit
Admission: RE | Admit: 2015-05-17 | Discharge: 2015-05-17 | Disposition: A | Discharge status: Home / Self Care | Payer: BLUE CROSS/BLUE SHIELD | Source: Ambulatory Visit | Attending: Physical Medicine and Rehabilitation | Admitting: Physical Medicine and Rehabilitation

## 2015-05-17 DIAGNOSIS — M545 Low back pain: Secondary | ICD-10-CM

## 2015-05-17 DIAGNOSIS — M542 Cervicalgia: Secondary | ICD-10-CM

## 2015-07-16 ENCOUNTER — Encounter (HOSPITAL_COMMUNITY): Payer: Self-pay | Admitting: *Deleted

## 2015-07-16 ENCOUNTER — Emergency Department (HOSPITAL_COMMUNITY)
Admission: EM | Admit: 2015-07-16 | Discharge: 2015-07-16 | Disposition: A | Discharge status: Home / Self Care | Payer: BLUE CROSS/BLUE SHIELD | Source: Home / Self Care | Attending: Emergency Medicine | Admitting: Emergency Medicine

## 2015-07-16 DIAGNOSIS — L03011 Cellulitis of right finger: Secondary | ICD-10-CM | POA: Diagnosis not present

## 2015-07-16 DIAGNOSIS — S6991XA Unspecified injury of right wrist, hand and finger(s), initial encounter: Secondary | ICD-10-CM

## 2015-07-16 MED ORDER — CEPHALEXIN 500 MG PO CAPS: 500.0000 mg | ORAL_CAPSULE | Freq: Four times a day (QID) | ORAL | Status: DC

## 2015-07-16 NOTE — ED Provider Notes (Signed)
CSN: [id number]     Arrival date & time 07/16/15  1507 History   First MD Initiated Contact with Patient 07/16/15 1706     Chief Complaint  Patient presents with  . Hand Injury   (Consider location/radiation/quality/duration/timing/severity/associated sxs/prior Treatment) HPI  She is a 49 year old woman here for evaluation of right index finger injury. She states she jammed her finger on Friday night. She states the nail seems to of partially lifted off her finger. She's been doing peroxide soaks. She states the radial edge of the finger has been getting more sore and swollen. No fevers or chills. She is able to fully flex and extend all joints in the finger.  Past Medical History  Diagnosis Date  . Anxiety   . GERD (gastroesophageal reflux disease)   . Hyperlipidemia   . Hypertension   . Asthma     only when gets a cold-expired inhaler  . PONV (postoperative nausea and vomiting)    Past Surgical History  Procedure Laterality Date  . Nasal stenosis repair  2003   . Breast surgery  2004    LUMPECTOMY  . Cesarean section      3  . Tubal ligation    . Abdominal hysterectomy  07-2006  . Carpal tunnel release  4-10    LEFT (Dr. Tommie Raymond)  . Laparoscopy  04/09/2012    Procedure: LAPAROSCOPY OPERATIVE;  Surgeon: Donnamae Jude, MD;  Location: [facility];  Service: Gynecology;  Laterality: N/A;  . Salpingoophorectomy  04/09/2012    Procedure: SALPINGO OOPHERECTOMY;  Surgeon: Donnamae Jude, MD;  Location: [facility];  Service: Gynecology;  Laterality: Right;   Family History  Problem Relation Age of Onset  . Heart attack Mother   . Cancer Mother     lung  . Heart disease Mother   . Hypertension Mother   . Stroke Mother   . Heart attack Father   . Heart disease Father   . Hypertension Father   . Diabetes Maternal Uncle   . Cancer Maternal Grandmother   . Heart failure Maternal Grandfather   . Heart failure Paternal Grandmother    Social History  Substance Use Topics  . Smoking  status: Never Smoker   . Smokeless tobacco: Never Used  . Alcohol Use: Yes     Comment: occasionaly   OB History    Gravida Para Term Preterm AB TAB SAB Ectopic Multiple Living   4 3   1  1   3      Review of Systems As in history of present illness Allergies  Penicillins; Amoxicillin-pot clavulanate; Atorvastatin; Cefprozil; Demerol; Dilaudid; Meloxicam; and Zithromax  Home Medications   Prior to Admission medications   Medication Sig Start Date End Date Taking? Authorizing Provider  albuterol (PROAIR HFA) 108 (90 BASE) MCG/ACT inhaler Inhale 2 puffs into the lungs 3 (three) times daily as needed for wheezing or shortness of breath. 07/25/14   Venia Carbon, MD  bisoprolol (ZEBETA) 5 MG tablet TAKE 1 TABLET BY MOUTH EVERY DAY 01/19/15   Venia Carbon, MD  cephALEXin (KEFLEX) 500 MG capsule Take 1 capsule (500 mg total) by mouth 4 (four) times daily. 07/16/15   Melony Overly, MD  omeprazole (PRILOSEC) 40 MG capsule Take 40 mg by mouth daily.     Historical Provider, MD  sucralfate (CARAFATE) 1 GM/10ML suspension Take 10 mLs (1 g total) by mouth 2 (two) times daily. 03/15/15   Venia Carbon, MD   Meds Ordered and Administered  this Visit  Medications - No data to display  BP 176/103 mmHg  Pulse 99  Temp(Src) 98.9 F (37.2 C) (Oral)  Resp 16  SpO2 97% No data found.   Physical Exam  Constitutional: She is oriented to person, place, and time. She appears well-developed and well-nourished. No distress.  Cardiovascular: Normal rate.   Pulmonary/Chest: Effort normal.  Musculoskeletal:  Right index finger: There is some swelling and erythema around the radial nail in cuticle. No focal abscess. The nail is partially lifted. There is some mild swelling at the MCP joint. Full active range of motion.  Neurological: She is alert and oriented to person, place, and time.    ED Course  Procedures (including critical care time)  Labs Review Labs Reviewed - No data to  display  Imaging Review No results found.    MDM   1. Finger injury, right, initial encounter   2. Paronychia of finger, right    We'll treat with Keflex for one week. Patient states she has taken Keflex before without difficulty. Recommended Epsom salts soaks 3 times a day for the next 3-4 days. Follow-up as needed.    Melony Overly, MD 07/16/15 [phone]

## 2015-07-16 NOTE — ED Notes (Signed)
Two  Days  Ago      jammmed  r  Index  Finger  On a  Shower  Door         Swollen tender  With nail  Involvement        Slightly  Red

## 2015-07-16 NOTE — Discharge Instructions (Signed)
It looks like you are developing an infection around the nail. Take Keflex 4 times a day for the next week. Do Epsom salts soaks 3 times a day for the next 3-4 days. Follow-up if it is not improving in the next 2-3 days.

## 2015-09-20 ENCOUNTER — Encounter: Payer: Self-pay | Admitting: Family Medicine

## 2015-09-20 ENCOUNTER — Ambulatory Visit (INDEPENDENT_AMBULATORY_CARE_PROVIDER_SITE_OTHER): Payer: BLUE CROSS/BLUE SHIELD | Admitting: Family Medicine

## 2015-09-20 VITALS — BP 130/78 | HR 85 | Temp 98.2°F | Wt 203.8 lb

## 2015-09-20 DIAGNOSIS — J0110 Acute frontal sinusitis, unspecified: Secondary | ICD-10-CM | POA: Diagnosis not present

## 2015-09-20 MED ORDER — CLINDAMYCIN HCL 300 MG PO CAPS: 300.0000 mg | ORAL_CAPSULE | Freq: Three times a day (TID) | ORAL | Status: DC

## 2015-09-20 NOTE — Progress Notes (Signed)
Pre visit review using our clinic review tool, if applicable. No additional management support is needed unless otherwise documented below in the visit note. 

## 2015-09-20 NOTE — Patient Instructions (Signed)
Nice to meet you. Happy Thanksgiving!

## 2015-09-20 NOTE — Progress Notes (Signed)
SUBJECTIVE:  Yvonne Stephenson is a 49 y.o. female who complains of coryza, sneezing, dry cough, bilateral sinus pain and wheezing for 8 days. She denies a history of chest pain and fevers and admits to a history of asthma. Patient denies smoke cigarettes.  Multiple sick contacts at school- "bacterial pneumonia."  Tmax 100.3  Has been using nebulizer and inhaler but still wheezing.   Multiple abx allergies. Current Outpatient Prescriptions on File Prior to Visit  Medication Sig Dispense Refill  . albuterol (PROAIR HFA) 108 (90 BASE) MCG/ACT inhaler Inhale 2 puffs into the lungs 3 (three) times daily as needed for wheezing or shortness of breath. 8.5 each 1  . bisoprolol (ZEBETA) 5 MG tablet TAKE 1 TABLET BY MOUTH EVERY DAY 90 tablet 2  . omeprazole (PRILOSEC) 40 MG capsule Take 40 mg by mouth daily.     . sucralfate (CARAFATE) 1 GM/10ML suspension Take 10 mLs (1 g total) by mouth 2 (two) times daily. 1680 mL 3   No current facility-administered medications on file prior to visit.    Allergies  Allergen Reactions  . Penicillins Shortness Of Breath    REACTION: difficulty breathing  . Amoxicillin-Pot Clavulanate Swelling    Tongue swelling  . Atorvastatin     REACTION: hip pain  . Cefprozil     REACTION: rash  . Demerol Nausea And Vomiting  . Dilaudid [Hydromorphone Hcl] Nausea And Vomiting  . Meloxicam [Meloxicam] Swelling    Face swelling and lip tingling  . Zithromax [Azithromycin]     REACTION: tongue felt funny    Past Medical History  Diagnosis Date  . Anxiety   . GERD (gastroesophageal reflux disease)   . Hyperlipidemia   . Hypertension   . Asthma     only when gets a cold-expired inhaler  . PONV (postoperative nausea and vomiting)     Past Surgical History  Procedure Laterality Date  . Nasal stenosis repair  2003   . Breast surgery  2004    LUMPECTOMY  . Cesarean section      3  . Tubal ligation    . Abdominal hysterectomy  07-2006  . Carpal tunnel  release  4-10    LEFT (Dr. Tommie Raymond)  . Laparoscopy  04/09/2012    Procedure: LAPAROSCOPY OPERATIVE;  Surgeon: Donnamae Jude, MD;  Location: [facility];  Service: Gynecology;  Laterality: N/A;  . Salpingoophorectomy  04/09/2012    Procedure: SALPINGO OOPHERECTOMY;  Surgeon: Donnamae Jude, MD;  Location: [facility];  Service: Gynecology;  Laterality: Right;    Family History  Problem Relation Age of Onset  . Heart attack Mother   . Cancer Mother     lung  . Heart disease Mother   . Hypertension Mother   . Stroke Mother   . Heart attack Father   . Heart disease Father   . Hypertension Father   . Diabetes Maternal Uncle   . Cancer Maternal Grandmother   . Heart failure Maternal Grandfather   . Heart failure Paternal Grandmother     Social History   Social History  . Marital Status: Married    Spouse Name: N/A  . Number of Children: 3  . 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      Social History Main Topics  . Smoking status: Never Smoker   . Smokeless tobacco: Never Used  . Alcohol Use: Yes  Comment: occasionaly  . Drug Use: No  . Sexual Activity: No   Other Topics Concern  . Not on file   Social History Narrative   Does walk a little for exercise   The PMH, PSH, Social History, Family History, Medications, and allergies have been reviewed in [facility], and have been updated if relevant.  OBJECTIVE: BP 130/78 mmHg  Pulse 85  Temp(Src) 98.2 F (36.8 C) (Oral)  Wt 203 lb 12 oz (92.42 kg)  SpO2 97%  She appears well, vital signs are as noted. Ears normal.  Throat and pharynx normal.  Neck supple. No adenopathy in the neck. Nose is congested. Sinuses  tender. The chest is clear, without wheezes or rales.  ASSESSMENT:  sinusitis  PLAN: Given duration and progression of symptoms, will treat for bacterial sinusitis with cleocin.  Symptomatic therapy suggested: push fluids, rest and return office  visit prn if symptoms persist or worsen.  Call or return to clinic prn if these symptoms worsen or fail to improve as anticipated.

## 2015-10-03 ENCOUNTER — Other Ambulatory Visit: Payer: Self-pay

## 2015-10-03 DIAGNOSIS — Z1231 Encounter for screening mammogram for malignant neoplasm of breast: Secondary | ICD-10-CM

## 2015-10-24 ENCOUNTER — Encounter: Payer: BLUE CROSS/BLUE SHIELD | Admitting: Internal Medicine

## 2015-10-24 ENCOUNTER — Ambulatory Visit
Admission: RE | Admit: 2015-10-24 | Discharge: 2015-10-24 | Disposition: A | Discharge status: Home / Self Care | Payer: BLUE CROSS/BLUE SHIELD | Source: Ambulatory Visit

## 2015-10-24 DIAGNOSIS — Z1231 Encounter for screening mammogram for malignant neoplasm of breast: Secondary | ICD-10-CM

## 2015-10-26 ENCOUNTER — Ambulatory Visit (INDEPENDENT_AMBULATORY_CARE_PROVIDER_SITE_OTHER): Payer: BLUE CROSS/BLUE SHIELD | Admitting: Family Medicine

## 2015-10-26 ENCOUNTER — Encounter: Payer: Self-pay | Admitting: Family Medicine

## 2015-10-26 VITALS — BP 126/68 | HR 76 | Temp 98.0°F | Wt 206.0 lb

## 2015-10-26 DIAGNOSIS — R194 Change in bowel habit: Secondary | ICD-10-CM

## 2015-10-26 NOTE — Progress Notes (Signed)
Subjective:   Patient ID: Yvonne Stephenson, female    DOB: 09-30-1966, 49 y.o.   MRN: [id number]  Yvonne Stephenson is a pleasant 49 y.o. year old female pt of Dr. Silvio Pate, who presents to clinic today for acute visit with Abdominal Pain and Diarrhea  on 10/26/2015  HPI:  5 days of increased gas, bowel habit changes- more loose stools, "not really diarrhea." Stools are a little more watery.  Has had some mild nausea, no vomiting. No fevers or chills. No black or bloody stools.  Does have h/o IBS and GERD- did take two of her omeprazole tablets to see if that would help her symptoms but it has not changed much. No known sick contacts.  Colonoscopy due- scheduled in a few months- over easter break.  "I dont really feel sick but just bloated with increased bowel movements."  Current Outpatient Prescriptions on File Prior to Visit  Medication Sig Dispense Refill  . albuterol (PROAIR HFA) 108 (90 BASE) MCG/ACT inhaler Inhale 2 puffs into the lungs 3 (three) times daily as needed for wheezing or shortness of breath. 8.5 each 1  . bisoprolol (ZEBETA) 5 MG tablet TAKE 1 TABLET BY MOUTH EVERY DAY 90 tablet 2  . omeprazole (PRILOSEC) 40 MG capsule Take 40 mg by mouth daily.     . sucralfate (CARAFATE) 1 GM/10ML suspension Take 10 mLs (1 g total) by mouth 2 (two) times daily. 1680 mL 3   No current facility-administered medications on file prior to visit.    Allergies  Allergen Reactions  . Penicillins Shortness Of Breath    REACTION: difficulty breathing  . Amoxicillin-Pot Clavulanate Swelling    Tongue swelling  . Atorvastatin     REACTION: hip pain  . Cefprozil     REACTION: rash  . Demerol Nausea And Vomiting  . Dilaudid [Hydromorphone Hcl] Nausea And Vomiting  . Meloxicam [Meloxicam] Swelling    Face swelling and lip tingling  . Zithromax [Azithromycin]     REACTION: tongue felt funny    Past Medical History  Diagnosis Date  . Anxiety   . GERD (gastroesophageal reflux  disease)   . Hyperlipidemia   . Hypertension   . Asthma     only when gets a cold-expired inhaler  . PONV (postoperative nausea and vomiting)     Past Surgical History  Procedure Laterality Date  . Nasal stenosis repair  2003   . Breast surgery  2004    LUMPECTOMY  . Cesarean section      3  . Tubal ligation    . Abdominal hysterectomy  07-2006  . Carpal tunnel release  4-10    LEFT (Dr. Tommie Raymond)  . Laparoscopy  04/09/2012    Procedure: LAPAROSCOPY OPERATIVE;  Surgeon: Donnamae Jude, MD;  Location: [facility];  Service: Gynecology;  Laterality: N/A;  . Salpingoophorectomy  04/09/2012    Procedure: SALPINGO OOPHERECTOMY;  Surgeon: Donnamae Jude, MD;  Location: [facility];  Service: Gynecology;  Laterality: Right;    Family History  Problem Relation Age of Onset  . Heart attack Mother   . Cancer Mother     lung  . Heart disease Mother   . Hypertension Mother   . Stroke Mother   . Heart attack Father   . Heart disease Father   . Hypertension Father   . Diabetes Maternal Uncle   . Cancer Maternal Grandmother   . Heart failure Maternal Grandfather   . Heart failure Paternal Grandmother  Social History   Social History  . Marital Status: Married    Spouse Name: N/A  . Number of Children: 3  . Years of Education: N/A   Occupational History  . 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      Social History Main Topics  . Smoking status: Never Smoker   . Smokeless tobacco: Never Used  . Alcohol Use: Yes     Comment: occasionaly  . Drug Use: No  . Sexual Activity: No   Other Topics Concern  . Not on file   Social History Narrative   Does walk a little for exercise   The PMH, PSH, Social History, Family History, Medications, and allergies have been reviewed in [facility], and have been updated if relevant.  Review of Systems  Constitutional: Negative.   Respiratory: Negative.   Cardiovascular: Negative.   Gastrointestinal: Positive for nausea,  abdominal pain and diarrhea. Negative for vomiting, constipation, blood in stool, abdominal distention, anal bleeding and rectal pain.  Genitourinary: Negative.   Musculoskeletal: Negative.   Skin: Negative.   Neurological: Negative.   Psychiatric/Behavioral: Negative.   All other systems reviewed and are negative.      Objective:    BP 126/68 mmHg  Pulse 76  Temp(Src) 98 F (36.7 C) (Oral)  Wt 206 lb (93.441 kg)  SpO2 94%   Physical Exam  Constitutional: She is oriented to person, place, and time. She appears well-developed and well-nourished. No distress.  HENT:  Head: Normocephalic.  Eyes: Conjunctivae are normal.  Cardiovascular: Normal rate.   Pulmonary/Chest: Effort normal.  Abdominal: Soft. Bowel sounds are normal. She exhibits no distension and no mass. There is no tenderness. There is no rebound and no guarding.  Neurological: She is alert and oriented to person, place, and time. No cranial nerve deficit.  Skin: Skin is warm and dry. She is not diaphoretic.  Psychiatric: She has a normal mood and affect. Her behavior is normal. Judgment and thought content normal.  Nursing note and vitals reviewed.         Assessment & Plan:   Bowel habit changes No Follow-up on file.

## 2015-10-26 NOTE — Progress Notes (Signed)
Pre visit review using our clinic review tool, if applicable. No additional management support is needed unless otherwise documented below in the visit note. 

## 2015-10-26 NOTE — Patient Instructions (Signed)
Nice to see you. Happy New Year.  Try over the counter gas-x (four times a day when necessary) or beano for bloating.    Trial of Align daily for 2 weeks.

## 2015-10-26 NOTE — Assessment & Plan Note (Signed)
New- ? Worsening IBS vs mild gastroenteritis or combination of both. Add align daily x 2 weeks, gas x or beano as needed for gas/bloating. Reassured by colonoscopy that is already been scheduled. Follow up with PCP. Discussed red flag symptoms warranting immediate follow up. The patient indicates understanding of these issues and agrees with the plan.

## 2015-11-13 ENCOUNTER — Encounter: Payer: Self-pay | Admitting: Internal Medicine

## 2015-11-13 ENCOUNTER — Ambulatory Visit (INDEPENDENT_AMBULATORY_CARE_PROVIDER_SITE_OTHER): Payer: BLUE CROSS/BLUE SHIELD | Admitting: Internal Medicine

## 2015-11-13 VITALS — BP 140/82 | HR 96 | Temp 98.4°F | Wt 203.0 lb

## 2015-11-13 DIAGNOSIS — B9789 Other viral agents as the cause of diseases classified elsewhere: Secondary | ICD-10-CM

## 2015-11-13 DIAGNOSIS — B349 Viral infection, unspecified: Secondary | ICD-10-CM | POA: Diagnosis not present

## 2015-11-13 DIAGNOSIS — J329 Chronic sinusitis, unspecified: Secondary | ICD-10-CM

## 2015-11-13 MED ORDER — HYDROCOD POLST-CPM POLST ER 10-8 MG/5ML PO SUER: 5.0000 mL | Freq: Every evening | ORAL | Status: DC | PRN

## 2015-11-13 NOTE — Patient Instructions (Signed)

## 2015-11-13 NOTE — Progress Notes (Signed)
Subjective:    Patient ID: Yvonne Stephenson, female    DOB: [birth year], 50 y.o.   MRN: [id number]  HPI Pt reports with a c/o sore throat, cough, shortness of breath and ear pressure. Cough started last Wednesday, feeling like an asthma flare up and all other symptoms have begun since then.  Pt admits to facial pressure, fatigue, chills, body aches, PND and aching neck. Her cough keeps her up at night. Tried ibuprophen and nyquil with limited relief. 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    Review of Systems    Past Medical History  Diagnosis Date  . Anxiety   . GERD (gastroesophageal reflux disease)   . Hyperlipidemia   . Hypertension   . Asthma     only when gets a cold-expired inhaler  . PONV (postoperative nausea and vomiting)     Current Outpatient Prescriptions  Medication Sig Dispense Refill  . albuterol (PROAIR HFA) 108 (90 BASE) MCG/ACT inhaler Inhale 2 puffs into the lungs 3 (three) times daily as needed for wheezing or shortness of breath. 8.5 each 1  . bisoprolol (ZEBETA) 5 MG tablet TAKE 1 TABLET BY MOUTH EVERY DAY 90 tablet 2  . omeprazole (PRILOSEC) 40 MG capsule Take 40 mg by mouth daily.     . sucralfate (CARAFATE) 1 GM/10ML suspension Take 10 mLs (1 g total) by mouth 2 (two) times daily. 1680 mL 3  . chlorpheniramine-HYDROcodone (TUSSIONEX PENNKINETIC ER) 10-8 MG/5ML SUER Take 5 mLs by mouth at bedtime as needed for cough. 140 mL 0   No current facility-administered medications for this visit.    Allergies  Allergen Reactions  . Penicillins Shortness Of Breath    REACTION: difficulty breathing  . Amoxicillin-Pot Clavulanate Swelling    Tongue swelling  . Atorvastatin     REACTION: hip pain  . Cefprozil     REACTION: rash  . Demerol Nausea And Vomiting  . Dilaudid [Hydromorphone Hcl] Nausea And Vomiting  . Meloxicam [Meloxicam] Swelling    Face swelling and lip tingling  . Zithromax [Azithromycin]     REACTION: tongue felt funny    Family  History  Problem Relation Age of Onset  . Heart attack Mother   . Cancer Mother     lung  . Heart disease Mother   . Hypertension Mother   . Stroke Mother   . Heart attack Father   . Heart disease Father   . Hypertension Father   . Diabetes Maternal Uncle   . Cancer Maternal Grandmother   . Heart failure Maternal Grandfather   . Heart failure Paternal Grandmother     Social History   Social History  . Marital Status: Married    Spouse Name: N/A  . Number of Children: 3  . 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      Social History Main Topics  . Smoking status: Never Smoker   . Smokeless tobacco: Never Used  . Alcohol Use: Yes     Comment: occasionaly  . Drug Use: No  . Sexual Activity: No   Other Topics Concern  . Not on file   Social History Narrative   Does walk a little for exercise     Constitutional: Positive fatigue, body aches and chills. Denies fever. HEENT: Positive ear pain (worse in left ear), sinus pressure, runny nose, sore throat and left ear pain. Denies eye pain,  eye redness,  ringing in the ears, wax buildup, or bloody nose. Respiratory: Denies difficulty breathing, shortness of breath, cough or sputum production.   Cardiovascular: Positive chest tightness. Denies chest pain, palpitations or swelling in the hands or feet.  Gastrointestinal: Denies abdominal pain, bloating, constipation, diarrhea or blood in the stool.  Skin: Denies redness, rashes, lesions or ulcercations.    No other specific complaints in a complete review of systems (except as listed in HPI above).     Objective:   Physical Exam BP 140/82 mmHg  Pulse 96  Temp(Src) 98.4 F (36.9 C) (Oral)  Wt 203 lb (92.08 kg)  SpO2 98% Wt Readings from Last 3 Encounters:  11/13/15 203 lb (92.08 kg)  10/26/15 206 lb (93.441 kg)  09/20/15 203 lb 12 oz (92.42 kg)    General: Appears their stated age, well  developed, well nourished in NAD. Skin: Hot, moist and intact. No rashes, lesions or ulcerations noted. HEENT: Head: normal shape and size; Eyes: sclera white, no icterus, conjunctiva pink, PERRLA and EOMs intact; Ears: Tm's gray and intact, normal light reflex; Nose: mucosa pink and moist, septum midline; Throat/Mouth: Teeth present, mucosa erythematous and moist, no exudate, lesions or ulcerations noted. +PND. Neck:  Neck supple, trachea midline. No masses, lumps or thyromegaly present.  Cardiovascular: Normal rate and rhythm. S1,S2 noted.  No murmur, rubs or gallops noted.  Pulmonary/Chest: Normal effort and positive vesicular breath sounds. No respiratory distress. No wheezes, rales or ronchi noted.      BMET    Component Value Date/Time   NA 137 10/22/2013 1015   K 4.1 10/22/2013 1015   CL 101 10/22/2013 1015   CO2 30 10/22/2013 1015   GLUCOSE 83 10/22/2013 1015   BUN 15 10/22/2013 1015   CREATININE 0.8 10/22/2013 1015   CALCIUM 9.3 10/22/2013 1015   GFRNONAA 85* 03/07/2013 2050   GFRAA >90 03/07/2013 2050    Lipid Panel     Component Value Date/Time   CHOL 211* 07/10/2012 1542   TRIG 396* 07/10/2012 1542   HDL 39* 07/10/2012 1542   CHOLHDL 5.4 07/10/2012 1542   VLDL 79* 07/10/2012 1542   LDLCALC 93 07/10/2012 1542    CBC    Component Value Date/Time   WBC 10.5 10/22/2013 1015   RBC 5.26* 10/22/2013 1015   HGB 14.3 10/22/2013 1015   HCT 42.9 10/22/2013 1015   PLT 220.0 10/22/2013 1015   MCV 81.5 10/22/2013 1015   MCH 28.0 03/07/2013 2050   MCHC 33.4 10/22/2013 1015   RDW 13.8 10/22/2013 1015   LYMPHSABS 4.2* 10/22/2013 1015   MONOABS 0.5 10/22/2013 1015   EOSABS 0.3 10/22/2013 1015   BASOSABS 0.0 10/22/2013 1015    Hgb A1C No results found for: HGBA1C        Assessment & Plan:  Viral Sinusitis:  Continue to take Flonase  Rx for Tussionex   Call on Thursday if symptoms are not improved to discuss if antibiotics are indicated.   RTC as needed.

## 2015-11-13 NOTE — Progress Notes (Signed)
HPI  Pt presents to the clinic today with c/o ear pressure, runny nose, sore throat and cough. This started 4-5 days ago. The cough is nonproductive, but it is keeping her up at night. She does have chest tightness and mild shortness of breath. She denies difficulty hearing, nasal congestion or pain with swallowing. She denies fever, but has had chills and body aches. She has tried Nyquil, Ibuprofen and her Albuterol inhaler with minimal relief. 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  Review of Systems      Past Medical History  Diagnosis Date  . Anxiety   . GERD (gastroesophageal reflux disease)   . Hyperlipidemia   . Hypertension   . Asthma     only when gets a cold-expired inhaler  . PONV (postoperative nausea and vomiting)     Family History  Problem Relation Age of Onset  . Heart attack Mother   . Cancer Mother     lung  . Heart disease Mother   . Hypertension Mother   . Stroke Mother   . Heart attack Father   . Heart disease Father   . Hypertension Father   . Diabetes Maternal Uncle   . Cancer Maternal Grandmother   . Heart failure Maternal Grandfather   . Heart failure Paternal Grandmother     Social History   Social History  . Marital Status: Married    Spouse Name: N/A  . Number of Children: 3  . 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      Social History Main Topics  . Smoking status: Never Smoker   . Smokeless tobacco: Never Used  . Alcohol Use: Yes     Comment: occasionaly  . Drug Use: No  . Sexual Activity: No   Other Topics Concern  . Not on file   Social History Narrative   Does walk a little for exercise    Allergies  Allergen Reactions  . Penicillins Shortness Of Breath    REACTION: difficulty breathing  . Amoxicillin-Pot Clavulanate Swelling    Tongue swelling  . Atorvastatin    REACTION: hip pain  . Cefprozil     REACTION: rash  . Demerol Nausea And Vomiting  . Dilaudid [Hydromorphone Hcl] Nausea And Vomiting  . Meloxicam [Meloxicam] Swelling    Face swelling and lip tingling  . Zithromax [Azithromycin]     REACTION: tongue felt funny     Constitutional: Denies headache, fatigue, fever or abrupt weight changes.  HEENT:  Positive ear fullness, runny nose, sore throat. Denies eye redness, eye pain, pressure behind the eyes, facial pain, nasal congestion, ear pain, ringing in the ears, wax buildup, or bloody nose. Respiratory: Positive cough. Denies difficulty breathing or shortness of breath.  Cardiovascular: Denies chest pain, chest tightness, palpitations or swelling in the hands or feet.   No other specific complaints in a complete review of systems (except as listed in HPI above).  Objective:   BP 140/82 mmHg  Pulse 96  Temp(Src) 98.4 F (36.9 C) (Oral)  Wt 203 lb (92.08 kg)  SpO2 98% Wt Readings from Last 3 Encounters:  11/13/15 203 lb (92.08 kg)  10/26/15 206 lb (93.441 kg)  09/20/15 203 lb 12 oz (92.42 kg)     General: Appears her stated age, in NAD. HEENT: Head: normal shape  and size, frontal sinus tenderness noted; Eyes: sclera white, no icterus, conjunctiva pink; Ears: Tm's gray and intact, normal light reflex; Nose: mucosa pink and moist, septum midline; Throat/Mouth: + PND. Teeth present, mucosa erythematous and moist, no exudate noted, no lesions or ulcerations noted.  Neck: No cervical lymphadenopathy.  Cardiovascular: Tachycardic with normal rhythm. S1,S2 noted.  No murmur, rubs or gallops noted.  Pulmonary/Chest: Normal effort and positive vesicular breath sounds. No respiratory distress. No wheezes, rales or ronchi noted.      Assessment & Plan:   Viral Sinusitis:  Get some rest and drink plenty of water Do salt water gargles/Ibuprofen for the sore throat Flonase daily for nasal congestion/sinus pressure eRx for Tussionex cough  syrup If symptoms persist, call Thursday, will call in abx for you  RTC as needed or if symptoms persist.

## 2015-11-13 NOTE — Progress Notes (Signed)
Pre visit review using our clinic review tool, if applicable. No additional management support is needed unless otherwise documented below in the visit note. 

## 2015-11-14 ENCOUNTER — Telehealth: Payer: Self-pay

## 2015-11-14 NOTE — Telephone Encounter (Signed)
She needs to give it a few more days with the suggestions give at the [facility]. Call back on Thursday if no improvement.

## 2015-11-14 NOTE — Telephone Encounter (Signed)
It hasn't even been 24 hours. She can give it a few more days or follow up with her PCP

## 2015-11-14 NOTE — Telephone Encounter (Signed)
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 Please advise

## 2015-11-14 NOTE — Telephone Encounter (Signed)
Pt is aware as instructed 

## 2015-11-14 NOTE — Telephone Encounter (Signed)
Pt states she has been doing Flonase etc no improvement in Sx

## 2015-11-14 NOTE — Telephone Encounter (Signed)
Pt left v/m; pt was seen 11/13/15; today pt has color to mucus when blows nose; sinus is worse and pt is requesting abx today instead of waiting until Thurs. Pt request cb. [facility].

## 2015-11-16 ENCOUNTER — Other Ambulatory Visit: Payer: Self-pay | Admitting: Internal Medicine

## 2015-11-16 MED ORDER — DOXYCYCLINE HYCLATE 100 MG PO TABS: 100.0000 mg | ORAL_TABLET | Freq: Two times a day (BID) | ORAL | Status: DC

## 2015-11-16 NOTE — Telephone Encounter (Addendum)
Pt left v/m; pt is feeling worse, temp is 99.4, when blows nose has green mucus and pain in rt ribcage and shoulder blade when takes deep breath,non prod cough and request med called to [facility]. Pt request cb when med sent in.

## 2015-11-16 NOTE — Telephone Encounter (Signed)
Doxycycline sent to pharmacy

## 2016-01-18 ENCOUNTER — Ambulatory Visit (INDEPENDENT_AMBULATORY_CARE_PROVIDER_SITE_OTHER): Payer: BLUE CROSS/BLUE SHIELD | Admitting: Family Medicine

## 2016-01-18 ENCOUNTER — Encounter: Payer: Self-pay | Admitting: Family Medicine

## 2016-01-18 VITALS — BP 120/80 | HR 68 | Temp 98.2°F | Ht 60.0 in | Wt 201.0 lb

## 2016-01-18 DIAGNOSIS — J069 Acute upper respiratory infection, unspecified: Secondary | ICD-10-CM | POA: Diagnosis not present

## 2016-01-18 DIAGNOSIS — J029 Acute pharyngitis, unspecified: Secondary | ICD-10-CM | POA: Diagnosis not present

## 2016-01-18 LAB — POCT RAPID STREP A (OFFICE): Rapid Strep A Screen: NEGATIVE

## 2016-01-18 NOTE — Patient Instructions (Signed)
Negative strep test. Voice rest,  mucinex DM to break up mucus,  nasal saline spary 2 sprays per nostril daily for congestion,  Ibuprofen 800 mg evry 8 hours for sore throat, rest!

## 2016-01-18 NOTE — Progress Notes (Signed)
   Subjective:    Patient ID: Yvonne Stephenson, female    DOB: 1966-05-22, 50 y.o.   MRN: [id number]  Fever  The current episode started in the past 7 days (5 days ago and then again today). The maximum temperature noted was 100 to 100.9 F. Associated symptoms include congestion and headaches. Pertinent negatives include no coughing or ear pain.  Sore Throat  The current episode started in the past 7 days. The problem has been gradually worsening. The maximum temperature recorded prior to her arrival was 100.4 - 100.9 F. Associated symptoms include congestion, headaches, a hoarse voice and trouble swallowing. Pertinent negatives include no coughing, ear discharge, ear pain or shortness of breath. Associated symptoms comments: White pockets in back of throat. She has had no exposure to strep or mono. Exposure to: works in [employer], son with flu last week.. She has tried NSAIDs for the symptoms. The treatment provided mild relief.   Social History /Family History/Past Medical History reviewed and updated if needed.   Review of Systems  Constitutional: Positive for fever.  HENT: Positive for congestion, hoarse voice and trouble swallowing. Negative for ear discharge and ear pain.   Respiratory: Negative for cough and shortness of breath.   Neurological: Positive for headaches.       Objective:   Physical Exam  Constitutional: Vital signs are normal. She appears well-developed and well-nourished. She is cooperative.  Non-toxic appearance. She does not appear ill. No distress.  HENT:  Head: Normocephalic.  Right Ear: Hearing, tympanic membrane, external ear and ear canal normal. Tympanic membrane is not erythematous, not retracted and not bulging.  Left Ear: Hearing, tympanic membrane, external ear and ear canal normal. Tympanic membrane is not erythematous, not retracted and not bulging.  Nose: Mucosal edema and rhinorrhea present. Right sinus exhibits no maxillary sinus tenderness and no  frontal sinus tenderness. Left sinus exhibits no maxillary sinus tenderness and no frontal sinus tenderness.  Mouth/Throat: Uvula is midline and mucous membranes are normal. Posterior oropharyngeal erythema present. No oropharyngeal exudate or posterior oropharyngeal edema.  Eyes: Conjunctivae, EOM and lids are normal. Pupils are equal, round, and reactive to light. Lids are everted and swept, no foreign bodies found.  Neck: Trachea normal and normal range of motion. Neck supple. Carotid bruit is not present. No thyroid mass and no thyromegaly present.  Cardiovascular: Normal rate, regular rhythm, S1 normal, S2 normal, normal heart sounds, intact distal pulses and normal pulses.  Exam reveals no gallop and no friction rub.   No murmur heard. Pulmonary/Chest: Effort normal and breath sounds normal. No tachypnea. No respiratory distress. She has no decreased breath sounds. She has no wheezes. She has no rhonchi. She has no rales.  Neurological: She is alert.  Skin: Skin is warm, dry and intact. No rash noted.  Psychiatric: Her speech is normal and behavior is normal. Judgment normal. Her mood appears not anxious. Cognition and memory are normal. She does not exhibit a depressed mood.          Assessment & Plan:

## 2016-01-18 NOTE — Assessment & Plan Note (Signed)
Voice rest, mucinex DM, nasal saline, Ibuprofen 800 mg evry 8 hours for sore throat, rest.

## 2016-01-18 NOTE — Progress Notes (Signed)
Pre visit review using our clinic review tool, if applicable. No additional management support is needed unless otherwise documented below in the visit note. 

## 2016-02-08 ENCOUNTER — Ambulatory Visit (INDEPENDENT_AMBULATORY_CARE_PROVIDER_SITE_OTHER): Payer: BLUE CROSS/BLUE SHIELD | Admitting: Family Medicine

## 2016-02-08 ENCOUNTER — Encounter: Payer: Self-pay | Admitting: Family Medicine

## 2016-02-08 VITALS — BP 134/84 | HR 71 | Temp 98.1°F | Ht 60.0 in | Wt 202.8 lb

## 2016-02-08 DIAGNOSIS — J4541 Moderate persistent asthma with (acute) exacerbation: Secondary | ICD-10-CM | POA: Diagnosis not present

## 2016-02-08 DIAGNOSIS — J208 Acute bronchitis due to other specified organisms: Secondary | ICD-10-CM

## 2016-02-08 DIAGNOSIS — Z889 Allergy status to unspecified drugs, medicaments and biological substances status: Secondary | ICD-10-CM | POA: Diagnosis not present

## 2016-02-08 MED ORDER — BECLOMETHASONE DIPROPIONATE 40 MCG/ACT IN AERS: 2.0000 | INHALATION_SPRAY | Freq: Two times a day (BID) | RESPIRATORY_TRACT | Status: DC

## 2016-02-08 MED ORDER — MOMETASONE FUROATE 220 MCG/INH IN AEPB
1.0000 | INHALATION_SPRAY | Freq: Every day | RESPIRATORY_TRACT | Status: DC
Start: 2016-02-08 — End: 2016-08-12

## 2016-02-08 MED ORDER — ALBUTEROL SULFATE HFA 108 (90 BASE) MCG/ACT IN AERS: 2.0000 | INHALATION_SPRAY | Freq: Three times a day (TID) | RESPIRATORY_TRACT | Status: DC | PRN

## 2016-02-08 MED ORDER — LEVOFLOXACIN 500 MG PO TABS: 500.0000 mg | ORAL_TABLET | Freq: Every day | ORAL | Status: DC

## 2016-02-08 MED ORDER — PREDNISONE 20 MG PO TABS: ORAL_TABLET | ORAL | Status: DC

## 2016-02-08 NOTE — Progress Notes (Signed)
Pre visit review using our clinic review tool, if applicable. No additional management support is needed unless otherwise documented below in the visit note. 

## 2016-02-08 NOTE — Progress Notes (Signed)
Dr. Frederico Hamman T. Arcenio Mullaly, MD, [facility] [place], [address] Phone: [phone] Fax: [phone]  02/08/2016  Patient: Yvonne Stephenson, MRN: [id number], DOB: 04-24-1966, 50 y.o.  Primary Physician:  Viviana Simpler, MD   Chief Complaint  Patient presents with  . Cough  . Shortness of Breath  . Hoarse  . Wheezing  . Nasal Congestion   Subjective:   Yvonne Stephenson is a 50 y.o. very pleasant female patient who presents with the following:  3 months, about two weeks ago, ended with a sore throat. Last week, had to hit the inhaler a lot. Blew her noose some and coughed up a little phlegm. Voice off for 6 weeks. At baseline she does not use any asthma maintenance medications, and she has been using her albuterol a lot for the last several months, and even in the last week she is been using it every 4 hours.  She has been wheezing off and on, she feels short of breath, she is coughing, as well as having some nasal congestion.  Asthma, feels like almost since January - this past week a whole lot.   Past Medical History, Surgical History, Social History, Family History, Problem List, Medications, and Allergies have been reviewed and updated if relevant.  Patient Active Problem List   Diagnosis Date Noted  . Viral URI 01/18/2016  . Bowel habit changes 10/26/2015  . Acute maxillary sinusitis 03/15/2015  . Acute bronchitis 07/25/2014  . Routine general medical examination at a health care facility 07/10/2012  . Fatty infiltration of liver 01/14/2012  . Anxiety attack 11/01/2011  . Hypertriglyceridemia 09/10/2007  . ANXIETY 09/10/2007  . HYPERTENSION 09/10/2007  . GERD 09/10/2007    Past Medical History  Diagnosis Date  . Anxiety   . GERD (gastroesophageal reflux disease)   . Hyperlipidemia   . Hypertension   . Asthma     only when gets a cold-expired inhaler  . PONV (postoperative nausea and vomiting)     Past  Surgical History  Procedure Laterality Date  . Nasal stenosis repair  2003   . Breast surgery  2004    LUMPECTOMY  . Cesarean section      3  . Tubal ligation    . Abdominal hysterectomy  07-2006  . Carpal tunnel release  4-10    LEFT (Dr. Tommie Raymond)  . Laparoscopy  04/09/2012    Procedure: LAPAROSCOPY OPERATIVE;  Surgeon: Donnamae Jude, MD;  Location: [facility];  Service: Gynecology;  Laterality: N/A;  . Salpingoophorectomy  04/09/2012    Procedure: SALPINGO OOPHERECTOMY;  Surgeon: Donnamae Jude, MD;  Location: [facility];  Service: Gynecology;  Laterality: Right;    Social History   Social History  . Marital Status: Married    Spouse Name: N/A  . Number of Children: 3  . Years of Education: N/A   Occupational History  . 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      Social History Main Topics  . Smoking status: Never Smoker   . Smokeless tobacco: Never Used  . Alcohol Use: Yes     Comment: occasionaly  . Drug Use: No  . Sexual Activity: No   Other Topics Concern  . Not on file   Social History Narrative   Does walk a little for exercise    Family History  Problem Relation Age of Onset  . Heart attack Mother   .  Cancer Mother     lung  . Heart disease Mother   . Hypertension Mother   . Stroke Mother   . Heart attack Father   . Heart disease Father   . Hypertension Father   . Diabetes Maternal Uncle   . Cancer Maternal Grandmother   . Heart failure Maternal Grandfather   . Heart failure Paternal Grandmother     Allergies  Allergen Reactions  . Penicillins Shortness Of Breath    REACTION: difficulty breathing  . Amoxicillin-Pot Clavulanate Swelling    Tongue swelling  . Atorvastatin     REACTION: hip pain  . Cefprozil     REACTION: rash  . Demerol Nausea And Vomiting  . Dilaudid [Hydromorphone Hcl] Nausea And Vomiting  . Doxycycline Other (See Comments)    Severe Abdominal Pain  . Meloxicam [Meloxicam] Swelling    Face swelling  and lip tingling  . Zithromax [Azithromycin]     REACTION: tongue felt funny    Medication list reviewed and updated in full in [place].  ROS: GEN: Acute illness details above GI: Tolerating PO intake GU: maintaining adequate hydration and urination Pulm: No SOB Interactive and getting along well at home.  Otherwise, ROS is as per the HPI.   Objective:   BP 134/84 mmHg  Pulse 71  Temp(Src) 98.1 F (36.7 C) (Oral)  Ht 5' (1.524 m)  Wt 202 lb 12 oz (91.967 kg)  BMI 39.60 kg/m2  SpO2 95%   GEN: A and O x 3. WDWN. NAD.    ENT: Nose clear, ext NML.  No LAD.  No JVD.  TM's clear. Oropharynx clear.  PULM: Normal WOB, no distress. No crackles, scattered wheezes, no rhonchi. CV: RRR, no M/G/R, No rubs, No JVD.   EXT: warm and well-perfused, No c/c/e. PSYCH: Pleasant and conversant.   Laboratory and Imaging Data:  Assessment and Plan:   Acute asthma flare, moderate persistent  Acute bronchitis due to other specified organisms  Multiple drug allergies  Asthma exacerbation. Long time coughing additionally, cannot exclude atypical pneumonia or pertussis, and the patient has a macrolide allergy.  Follow-up: No Follow-up on file.  New Prescriptions   LEVOFLOXACIN (LEVAQUIN) 500 MG TABLET    Take 1 tablet (500 mg total) by mouth daily.   MOMETASONE (ASMANEX 60 METERED DOSES) 220 MCG/INH INHALER    Inhale 1 puff into the lungs at bedtime.   PREDNISONE (DELTASONE) 20 MG TABLET    2 tabs po for 5 days, then 1 tab for 5 days   Modified Medications   Modified Medication Previous Medication   ALBUTEROL (PROAIR HFA) 108 (90 BASE) MCG/ACT INHALER albuterol (PROAIR HFA) 108 (90 BASE) MCG/ACT inhaler      Inhale 2 puffs into the lungs 3 (three) times daily as needed for wheezing or shortness of breath.    Inhale 2 puffs into the lungs 3 (three) times daily as needed for wheezing or shortness of breath.   No orders of the defined types were placed in this encounter.     Signed,  Maud Deed. Markas Aldredge, MD   Patient's Medications  New Prescriptions   LEVOFLOXACIN (LEVAQUIN) 500 MG TABLET    Take 1 tablet (500 mg total) by mouth daily.   MOMETASONE (ASMANEX 60 METERED DOSES) 220 MCG/INH INHALER    Inhale 1 puff into the lungs at bedtime.   PREDNISONE (DELTASONE) 20 MG TABLET    2 tabs po for 5 days, then 1 tab for 5 days  Previous  Medications   BISOPROLOL (ZEBETA) 5 MG TABLET    TAKE 1 TABLET BY MOUTH EVERY DAY   OMEPRAZOLE (PRILOSEC) 40 MG CAPSULE    Take 40 mg by mouth daily.    SUCRALFATE (CARAFATE) 1 GM/10ML SUSPENSION    Take 10 mLs (1 g total) by mouth 2 (two) times daily.  Modified Medications   Modified Medication Previous Medication   ALBUTEROL (PROAIR HFA) 108 (90 BASE) MCG/ACT INHALER albuterol (PROAIR HFA) 108 (90 BASE) MCG/ACT inhaler      Inhale 2 puffs into the lungs 3 (three) times daily as needed for wheezing or shortness of breath.    Inhale 2 puffs into the lungs 3 (three) times daily as needed for wheezing or shortness of breath.  Discontinued Medications   No medications on file

## 2016-02-12 ENCOUNTER — Telehealth: Payer: Self-pay

## 2016-02-12 NOTE — Telephone Encounter (Signed)
Please check on her in the morning

## 2016-02-12 NOTE — Telephone Encounter (Signed)
PLEASE NOTE: All timestamps contained within this report are represented as [place] Standard Time. CONFIDENTIALTY NOTICE: This fax transmission is intended only for the addressee. It contains information that is legally privileged, confidential or otherwise protected from use or disclosure. If you are not the intended recipient, you are strictly prohibited from reviewing, disclosing, copying using or disseminating any of this information or taking any action in reliance on or regarding this information. If you have received this fax in error, please notify us immediately by telephone so that we can arrange for its return to [place]. Phone: [phone], Toll-Free: [phone], Fax: [phone] Page: 1 of 4 Call Id: [id number] [place] Patient Name: Yvonne Stephenson Gender: Female DOB: May 20, 1966 Age: 50 Y 3 M 10 D Return Phone Number: [phone] (Primary), [phone] (Secondary) Address: City/State/Zip: [place] Client [facility] Night - Client Client Site [facility]ician [place], [place] Type Call Who Is Calling Patient / Member / Family / Caregiver Call Type Triage / Clinical Caller Name Caresse Relationship To Patient Self Return Phone Number [phone] (Primary) Chief Complaint Cough Reason for Call Symptomatic / Request for [place] states she was i/o yesterday, and she was out on some abx and a new inhaler. She has still has been coughing all night though. PreDisposition Call Doctor Translation No Nurse Assessment Nurse: Wynetta Emery, RN, Baker Janus Date/Time Eilene Ghazi Time): 02/09/2016 10:10:26 AM Confirm and document reason for call. If symptomatic, describe symptoms. You must click the next button to save text entered. ---Mardene Celeste was in office yesterday and placed on medications steroid, inhaler  and antibiotic was up all night last evening coughing inhaler is only 1 puff at night did use her rescue inhaler during the night. Has the patient traveled out of the country within the last 30 days? ---No Does the patient have any new or worsening symptoms? ---Yes Will a triage be completed? ---Yes Related visit to physician within the last 2 weeks? ---Yes Does the PT have any chronic conditions? (i.e. diabetes, asthma, etc.) ---Yes List chronic conditions. ---Asthma Is the patient pregnant or possibly pregnant? (Ask all females between the ages of 65-55) ---No Is this a behavioral health or substance abuse call? ---No Guidelines Guideline Title Affirmed Question Affirmed Notes Nurse Date/Time (Eastern Time) Asthma Attack [1] Coughing continuously (nonstop) AND [2] keeps from working or sleeping [place], RN, Baker Janus 02/09/2016 10:12:55 AM PLEASE NOTE: All timestamps contained within this report are represented as [place] Standard Time. CONFIDENTIALTY NOTICE: This fax transmission is intended only for the addressee. It contains information that is legally privileged, confidential or otherwise protected from use or disclosure. If you are not the intended recipient, you are strictly prohibited from reviewing, disclosing, copying using or disseminating any of this information or taking any action in reliance on or regarding this information. If you have received this fax in error, please notify us immediately by telephone so that we can arrange for its return to [place]. Phone: [phone], Toll-Free: [phone], Fax: [phone] Page: 2 of 4 Call Id: [id number] Guidelines Guideline Title Affirmed Question Affirmed Notes Nurse Date/Time Eilene Ghazi Time) [3] not improved after 2 nebulizer or inhaler treatments given 20 minutes apart Disp. Time Eilene Ghazi Time) Disposition Final User 02/09/2016 10:17:30 AM Paged On Call back to [facility], [place] 02/09/2016 10:49:40 AM Paged On  Call back to [facility], [place] 02/09/2016 11:18:38 AM Pharmacy  Call Wynetta Emery, RN, Baker Janus Reason: Nurse called [facility] [phone] left message on voice recorder at 1114am for medication order see medication orders. 02/09/2016 11:19:42 AM Call Completed Wynetta Emery, RN, Baker Janus 02/09/2016 10:16:40 AM See Physician within 4 Hours (or PCP triage) Yes Wynetta Emery, RN, Christin Bach Understands: Yes Disagree/Comply: Comply Care Advice Given Per Guideline SEE PHYSICIAN WITHIN 4 HOURS (or PCP triage): * You become worse. * Please bring a list of your current medicines when you go to see the doctor. * It is also a good idea to bring the pill bottles too. This will help the doctor to make certain you are taking the right medicines and the right dose. Verbal Orders/Maintenance Medications Medication Refill Route Dosage Regime Duration Admin Instructions User Name Benzonate 200mg  one tablet BID PRN for cough #20 NR Oral one tablet BID As Needed Wynetta Emery, RN, Baker Janus Comments User: Michele Rockers, RN Date/Time Eilene Ghazi Time): 02/09/2016 10:20:38 AM NOTE: she was placed on Asmanex twist inhaler 1 puff at night; prednisone 20mg  2 tablets x 5 days then 1 tablet x 5 days continue using pro air inhaler 2 puffs every 4 hours and levaquin 500mg  one tablet x 10 days coughing is worse at night wondering if something like cough medication be called in or something recommended to stop cough at night inhaler worked but cough kept her awake all night. User: Michele Rockers, RN Date/Time Eilene Ghazi Time): 02/09/2016 11:17:54 AM NOTE patient made aware that medication for cough will be called in. Referrals GO TO FACILITY UNDECIDED PLEASE NOTE: All timestamps contained within this report are represented as [place] Standard Time. CONFIDENTIALTY NOTICE: This fax transmission is intended only for the addressee. It contains information that is legally privileged, confidential or otherwise protected from use or disclosure. If you are  not the intended recipient, you are strictly prohibited from reviewing, disclosing, copying using or disseminating any of this information or taking any action in reliance on or regarding this information. If you have received this fax in error, please notify us immediately by telephone so that we can arrange for its return to [place]. Phone: [phone], Toll-Free: [phone], Fax: [phone] Page: 3 of 4 Call Id: [id number] Paging DoctorName Phone DateTime Result/Outcome Message Type Notes Alysia Penna [id number] 02/09/2016 10:17:30 AM Paged On Call Back to Call Center Doctor Paged Call [place] [phone] thanks Alysia Penna [id number] 02/09/2016 10:49:40 AM Called On Call Provider - Left Message Doctor Paged Alysia Penna 02/09/2016 11:19:26 AM Spoke with On Call - Outcome Notification Message Result gave information to Md MD Directives call in medication for her her (see medication order) PLEASE NOTE: All timestamps contained within this report are represented as [place] Standard Time. CONFIDENTIALTY NOTICE: This fax transmission is intended only for the addressee. It contains information that is legally privileged, confidential or otherwise protected from use or disclosure. If you are not the intended recipient, you are strictly prohibited from reviewing, disclosing, copying using or disseminating any of this information or taking any action in reliance on or regarding this information. If you have received this fax in error, please notify us immediately by telephone so that we can arrange for its return to [place]. Phone: [phone], Toll-Free: [phone], Fax: [phone] Page: 4 of 4 Call Id: [id number] [facility] [address], [place] [address] [phone] Fax: [phone] [place] Night - Client [place] - Night Date: 02/09/2016 From: [facility]  Department To: Owens Loffler  Please sign the order for the approved drug(s) given by our call center nurse on your behalf. Fax to [phone] within 5 business days. Thank you. Date (Eastern Time): 02/09/2016 9:47:45 AM Triage RN: Michele Rockers, RN NAME: Starleen Blue PHONE NUMBER: [phone] (Primary), [phone] (Secondary) BIRTHDATE: December 08, 1965 ADDRESS: CITY/STATE/ZIP: [place] CALLER: Self NAME: Mardene Celeste Rx Given Medication Refill Route Dosage Regime Duration Admin Instructions Benzonate 200mg  one tablet BID PRN for cough #20 NR Oral one tablet BID As Needed MD Signature Date

## 2016-02-13 NOTE — Telephone Encounter (Signed)
Left message for patient to call back  

## 2016-02-16 NOTE — Telephone Encounter (Signed)
Spoke to patient. She is retaining fluid due to the prednisone. She feels better.

## 2016-03-04 ENCOUNTER — Other Ambulatory Visit: Payer: Self-pay | Admitting: Internal Medicine

## 2016-04-08 ENCOUNTER — Ambulatory Visit (INDEPENDENT_AMBULATORY_CARE_PROVIDER_SITE_OTHER): Payer: BLUE CROSS/BLUE SHIELD

## 2016-04-08 ENCOUNTER — Ambulatory Visit: Payer: Self-pay | Admitting: Podiatry

## 2016-04-08 ENCOUNTER — Ambulatory Visit (INDEPENDENT_AMBULATORY_CARE_PROVIDER_SITE_OTHER): Payer: BLUE CROSS/BLUE SHIELD | Admitting: Podiatry

## 2016-04-08 ENCOUNTER — Encounter: Payer: Self-pay | Admitting: Podiatry

## 2016-04-08 VITALS — BP 142/76 | HR 76 | Resp 12

## 2016-04-08 DIAGNOSIS — M722 Plantar fascial fibromatosis: Secondary | ICD-10-CM | POA: Diagnosis not present

## 2016-04-08 DIAGNOSIS — M79672 Pain in left foot: Secondary | ICD-10-CM | POA: Diagnosis not present

## 2016-04-08 MED ORDER — METHYLPREDNISOLONE 4 MG PO TBPK: ORAL_TABLET | ORAL | Status: DC

## 2016-04-08 MED ORDER — CELECOXIB 200 MG PO CAPS: 200.0000 mg | ORAL_CAPSULE | Freq: Two times a day (BID) | ORAL | Status: DC

## 2016-04-08 NOTE — Patient Instructions (Signed)
Plantar Fasciitis Plantar fasciitis is a painful foot condition that affects the heel. It occurs when the band of tissue that connects the toes to the heel bone (plantar fascia) becomes irritated. This can happen after exercising too much or doing other repetitive activities (overuse injury). The pain from plantar fasciitis can range from mild irritation to severe pain that makes it difficult for you to walk or move. The pain is usually worse in the morning or after you have been sitting or lying down for a while. CAUSES This condition may be caused by:  Standing for long periods of time.  Wearing shoes that do not fit.  Doing high-impact activities, including running, aerobics, and ballet.  Being overweight.  Having an abnormal way of walking (gait).  Having tight calf muscles.  Having high arches in your feet.  Starting a new athletic activity. SYMPTOMS The main symptom of this condition is heel pain. Other symptoms include:  Pain that gets worse after activity or exercise.  Pain that is worse in the morning or after resting.  Pain that goes away after you walk for a few minutes. DIAGNOSIS This condition may be diagnosed based on your signs and symptoms. Your health care provider will also do a physical exam to check for:  A tender area on the bottom of your foot.  A high arch in your foot.  Pain when you move your foot.  Difficulty moving your foot. You may also need to have imaging studies to confirm the diagnosis. These can include:  X-rays.  Ultrasound.  MRI. TREATMENT  Treatment for plantar fasciitis depends on the severity of the condition. Your treatment may include:  Rest, ice, and over-the-counter pain medicines to manage your pain.  Exercises to stretch your calves and your plantar fascia.  A splint that holds your foot in a stretched, upward position while you sleep (night splint).  Physical therapy to relieve symptoms and prevent problems in the  future.  Cortisone injections to relieve severe pain.  Extracorporeal shock wave therapy (ESWT) to stimulate damaged plantar fascia with electrical impulses. It is often used as a last resort before surgery.  Surgery, if other treatments have not worked after 12 months. HOME CARE INSTRUCTIONS  Take medicines only as directed by your health care provider.  Avoid activities that cause pain.  Roll the bottom of your foot over a bag of ice or a bottle of cold water. Do this for 20 minutes, 3-4 times a day.  Perform simple stretches as directed by your health care provider.  Try wearing athletic shoes with air-sole or gel-sole cushions or soft shoe inserts.  Wear a night splint while sleeping, if directed by your health care provider.  Keep all follow-up appointments with your health care provider. PREVENTION   Do not perform exercises or activities that cause heel pain.  Consider finding low-impact activities if you continue to have problems.  Lose weight if you need to. The best way to prevent plantar fasciitis is to avoid the activities that aggravate your plantar fascia. SEEK MEDICAL CARE IF:  Your symptoms do not go away after treatment with home care measures.  Your pain gets worse.  Your pain affects your ability to move or do your daily activities.   This information is not intended to replace advice given to you by your health care provider. Make sure you discuss any questions you have with your health care provider.   Document Released: 07/09/2001 Document Revised: 07/05/2015 Document Reviewed: 08/24/2014 Elsevier   Interactive Patient Education 2016 Elsevier Inc.  

## 2016-04-08 NOTE — Progress Notes (Signed)
   Subjective:    Patient ID: Yvonne Stephenson, female    DOB: 10/06/1966, 50 y.o.   MRN: [id number]  HPI: She presents today with her husband and a chief complaint of pain to the left foot times a passive 3 months. States this seems to be getting worse. She's had plantar fasciitis in his foot before. She has a plantar fascia brace and night splint.    Review of Systems  HENT: Positive for sinus pressure and sneezing.   Musculoskeletal: Positive for back pain, joint swelling and gait problem.  Allergic/Immunologic: Positive for food allergies.  Neurological: Positive for headaches.       Objective:   Physical Exam: Vital signs are stable alert and oriented 3 pulses are palpable. Neurologic system is intact degenerative flexion intact muscle strength is bilateral. She has pain on palpation medial trochanter of the left heel. Ready yesterday demonstrate plantar distally oriented calcaneal [place] soft tissue resistance he answers site.        Assessment & Plan:  Plantar fasciitis left.  Plan: Injected left heel today for plantar fasciitis of the left foot. I placed her on a Medrol Dosepak to be followed by Celebrex. We discussed the use of her plantar fascia brace and night splint which she has a home.

## 2016-05-10 ENCOUNTER — Ambulatory Visit: Payer: BLUE CROSS/BLUE SHIELD | Admitting: Internal Medicine

## 2016-05-10 ENCOUNTER — Telehealth: Payer: Self-pay | Admitting: Internal Medicine

## 2016-05-10 NOTE — Telephone Encounter (Signed)
See if we can do any service recovery--- not sure why there are communication issues about the time (if repeated, I can understand her frustration but I have a hard time believing she is being told the incorrect time).;

## 2016-05-10 NOTE — Telephone Encounter (Signed)
Pt was scheduled today at 1:15 with Webb Silversmith. Pt came in at 12:15 thinking that her appt was then. She left when we told her she was 1 hr early, but we spoke with Rollene Fare and she said she was able to see her now. Called pt back and said she was on her way to the walk in urgent care. Pt stated this was the 2nd time this has happened to her and she was looking for another doctor and hung up on me.  Would you like me to remove as PCP?

## 2016-05-13 ENCOUNTER — Ambulatory Visit (INDEPENDENT_AMBULATORY_CARE_PROVIDER_SITE_OTHER): Payer: BLUE CROSS/BLUE SHIELD | Admitting: Internal Medicine

## 2016-05-13 ENCOUNTER — Encounter: Payer: Self-pay | Admitting: Internal Medicine

## 2016-05-13 VITALS — BP 130/90 | HR 86 | Temp 98.1°F | Wt 193.0 lb

## 2016-05-13 DIAGNOSIS — H6092 Unspecified otitis externa, left ear: Secondary | ICD-10-CM | POA: Diagnosis not present

## 2016-05-13 MED ORDER — NEOMYCIN-POLYMYXIN-HC 3.5-10000-1 OT SUSP: 4.0000 [drp] | Freq: Four times a day (QID) | OTIC | Status: DC

## 2016-05-13 NOTE — Progress Notes (Signed)
Pre visit review using our clinic review tool, if applicable. No additional management support is needed unless otherwise documented below in the visit note. 

## 2016-05-13 NOTE — Assessment & Plan Note (Signed)
Discussed prophylaxis Will treat with cortisporin

## 2016-05-13 NOTE — Progress Notes (Signed)
Subjective:    Patient ID: Yvonne Stephenson, female    DOB: October 24, 1966, 50 y.o.   MRN: [id number]  HPI Here due to left ear pain Reviewed issues with 7/14 appt Went to urgent care but too crowded  Started in the past week Has been in chlorinated pool daily Wonders if it is related to this Tried benedryl--may have helped some  No discharge No fever, congestion, rhinorrhea or cough No discharge Does have mild tenderness---thinks canal may be slightly swollenb  Current Outpatient Prescriptions on File Prior to Visit  Medication Sig Dispense Refill  . bisoprolol (ZEBETA) 5 MG tablet TAKE 1 TABLET BY MOUTH EVERY DAY 90 tablet 0  . omeprazole (PRILOSEC) 40 MG capsule Take 40 mg by mouth daily.     . sucralfate (CARAFATE) 1 GM/10ML suspension Take 10 mLs (1 g total) by mouth 2 (two) times daily. 1680 mL 3  . albuterol (PROAIR HFA) 108 (90 Base) MCG/ACT inhaler Inhale 2 puffs into the lungs 3 (three) times daily as needed for wheezing or shortness of breath. (Patient not taking: Reported on 05/13/2016) 8.5 each 2  . celecoxib (CELEBREX) 200 MG capsule Take 1 capsule (200 mg total) by mouth 2 (two) times daily. (Patient not taking: Reported on 05/13/2016) 30 capsule 3  . mometasone (ASMANEX 60 METERED DOSES) 220 MCG/INH inhaler Inhale 1 puff into the lungs at bedtime. (Patient not taking: Reported on 05/13/2016) 1 Inhaler 3   No current facility-administered medications on file prior to visit.    Allergies  Allergen Reactions  . Penicillins Shortness Of Breath    REACTION: difficulty breathing  . Amoxicillin-Pot Clavulanate Swelling    Tongue swelling  . Atorvastatin     REACTION: hip pain  . Cefprozil     REACTION: rash  . Demerol Nausea And Vomiting  . Dilaudid [Hydromorphone Hcl] Nausea And Vomiting  . Doxycycline Other (See Comments)    Severe Abdominal Pain  . Meloxicam [Meloxicam] Swelling    Face swelling and lip tingling  . Zithromax [Azithromycin]     REACTION: tongue  felt funny    Past Medical History  Diagnosis Date  . Anxiety   . GERD (gastroesophageal reflux disease)   . Hyperlipidemia   . Hypertension   . Asthma     only when gets a cold-expired inhaler  . PONV (postoperative nausea and vomiting)     Past Surgical History  Procedure Laterality Date  . Nasal stenosis repair  2003   . Breast surgery  2004    LUMPECTOMY  . Cesarean section      3  . Tubal ligation    . Abdominal hysterectomy  07-2006  . Carpal tunnel release  4-10    LEFT (Dr. Tommie Raymond)  . Laparoscopy  04/09/2012    Procedure: LAPAROSCOPY OPERATIVE;  Surgeon: Donnamae Jude, MD;  Location: [facility];  Service: Gynecology;  Laterality: N/A;  . Salpingoophorectomy  04/09/2012    Procedure: SALPINGO OOPHERECTOMY;  Surgeon: Donnamae Jude, MD;  Location: [facility];  Service: Gynecology;  Laterality: Right;    Family History  Problem Relation Age of Onset  . Heart attack Mother   . Cancer Mother     lung  . Heart disease Mother   . Hypertension Mother   . Stroke Mother   . Heart attack Father   . Heart disease Father   . Hypertension Father   . Diabetes Maternal Uncle   . Cancer Maternal Grandmother   . Heart failure Maternal  Grandfather   . Heart failure Paternal Grandmother     Social History   Social History  . Marital Status: Married    Spouse Name: N/A  . Number of Children: 3  . Years of Education: N/A   Occupational History  . 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      Social History Main Topics  . Smoking status: Never Smoker   . Smokeless tobacco: Never Used  . Alcohol Use: Yes     Comment: occasionaly  . Drug Use: No  . Sexual Activity: No   Other Topics Concern  . Not on file   Social History Narrative   Does walk a little for exercise   Review of Systems No vertigo No tinnitus  Hearing is okay    Objective:   Physical Exam  HENT:  Mouth/Throat: Oropharynx is clear and moist. No oropharyngeal exudate.  Mild  nasal inflammation Right TM/canal normal Left ear--mild tragal sensitivity TM normal Slightly canal inflammation  Neck: Normal range of motion. Neck supple. No thyromegaly present.  Lymphadenopathy:    She has no cervical adenopathy.          Assessment & Plan:

## 2016-05-13 NOTE — Patient Instructions (Signed)
You can try a mixture of rubbing alcohol and white vinegar--a few drops in your ears before swimming--to prevent future problems.

## 2016-05-15 ENCOUNTER — Encounter: Payer: Self-pay | Admitting: Podiatry

## 2016-05-15 ENCOUNTER — Ambulatory Visit (INDEPENDENT_AMBULATORY_CARE_PROVIDER_SITE_OTHER): Payer: BLUE CROSS/BLUE SHIELD | Admitting: Podiatry

## 2016-05-15 DIAGNOSIS — M722 Plantar fascial fibromatosis: Secondary | ICD-10-CM | POA: Diagnosis not present

## 2016-05-15 NOTE — Progress Notes (Signed)
She presents today for follow-up of her plantar fasciitis to her left heel. She states that he was doing great distally a few days ago however over the past few days it has started to recur. She states that she is unable to wear the plantar fascia brace and the night splint showing makes it approximately halfway through the night before it has to come off.  Objective: Vital signs are stable alert and oriented 3. Pulses are palpable. Neurologic sensorium is intact. Deep tendon reflexes are intact. She has pain on palpation medial trochanter tubercle of the left heel much decreased from previous evaluation. I would think she is proximally 50-60% improved at this point.  Assessment: Well-healing plantar fasciitis left foot.  Plan: I encouraged her to continue all conservative therapies including braces strapping night splints medications and I reinjected the left heel today. We discussed the possible need for orthotics which will be necessity upon the next visit.

## 2016-06-24 ENCOUNTER — Telehealth: Payer: Self-pay | Admitting: Internal Medicine

## 2016-06-24 MED ORDER — LORAZEPAM 0.5 MG PO TABS: ORAL_TABLET | ORAL | 0 refills | Status: DC

## 2016-06-24 NOTE — Telephone Encounter (Signed)
Patient called.  Patient's mother is in the process of passing away.  Patient said she's okay during the day because she's busy,but at night she has a hard time with anxiety and sleeping.  Patient said she was given some medicine for anxiety.  She said that helped and was wondering if it could be called in to her pharmacy, so she can take it at night.  Patient uses [facility].  Patient said if she doesn't answer,please leave a detailed message.

## 2016-06-24 NOTE — Telephone Encounter (Signed)
Okay to send Rx for lorazepam 0.5 1-2 tabs at bedtime prn insomnia #20 x 0  Tell her I am sorry to hear about this

## 2016-06-24 NOTE — Telephone Encounter (Signed)
Left refill on voice mail at pharmacy Spoke to pt

## 2016-06-26 ENCOUNTER — Ambulatory Visit: Payer: BLUE CROSS/BLUE SHIELD | Admitting: Podiatry

## 2016-07-05 ENCOUNTER — Other Ambulatory Visit: Payer: Self-pay | Admitting: Internal Medicine

## 2016-08-09 ENCOUNTER — Telehealth: Payer: Self-pay

## 2016-08-09 MED ORDER — BECLOMETHASONE DIPROPIONATE 40 MCG/ACT IN AERS: 2.0000 | INHALATION_SPRAY | Freq: Two times a day (BID) | RESPIRATORY_TRACT | 3 refills | Status: DC

## 2016-08-09 NOTE — Telephone Encounter (Signed)
PLEASE NOTE: All timestamps contained within this report are represented as [place] Standard Time. CONFIDENTIALTY NOTICE: This fax transmission is intended only for the addressee. It contains information that is legally privileged, confidential or otherwise protected from use or disclosure. If you are not the intended recipient, you are strictly prohibited from reviewing, disclosing, copying using or disseminating any of this information or taking any action in reliance on or regarding this information. If you have received this fax in error, please notify us immediately by telephone so that we can arrange for its return to [place]. Phone: [phone], Toll-Free: [phone], Fax: [phone] Page: 1 of 1 Call Id: [id number] [place] Patient Name: Yvonne Stephenson Gender: Female DOB: 1966-01-04 Age: 50 Y 9 M 8 D Return Phone Number: [phone] (Primary), [phone] (Secondary) Address: City/State/Zip: [place] Night - Client Client Site [place] Physician Viviana Simpler - MD Contact Type Call Who Is Calling Patient / Member / Family / Caregiver Call Type Triage / Clinical Relationship To Patient Self Return Phone Number [phone] (Primary) Chief Complaint Prescription Refill or Medication Request (non symptomatic) Reason for Call Symptomatic / Request for [place] states has been having some asthma issues and is needing assistance in getting a prescription for a rescue inhaler. Translation No Nurse Assessment Nurse: Donovan Kail, RN, Barnetta Chapel Date/Time (Eastern Time): 08/08/2016 5:09:43 PM Please select the assessment type ---Refill Additional Documentation ---Caller states has been having some asthma issues and is needing assistance in getting a prescription for a rescue inhaler. Caller  says she is not feeling the best but denies the need for nurse. She is using both her nebulizer and inhaler but still she is getting hoarse. She can't take deep breaths in the morning. She was last seen by Dr in May. She went on steroids. She gained weight and doesn't want to go on steroids. She has the albuterol inhaler and is calling for the second inhaler ( a round one but she doesn't know the name) that was prescribed around Jan to May 2017. [phone]. Does the patient have enough medication to last until the office opens? ---Yes Guidelines Guideline Title Affirmed Question Affirmed Notes Nurse Date/Time ([place] Time) Disp. Time Eilene Ghazi Time) Disposition Final User 08/08/2016 5:17:06 PM Clinical Call Yes Donovan Kail, RN, Barnetta Chapel

## 2016-08-09 NOTE — Telephone Encounter (Signed)
Spoke to pt. She cannot make an appt today. Will do things over the weekend and call back Monday for an appt if no improvement

## 2016-08-09 NOTE — Telephone Encounter (Signed)
Please let her know that I sent in the prescription for the q-var steroid inhaler again. She needs to use this with a spacer and rinse her mouth out after using it. If she is not improving, or has significant SOB, she needs to be seen. If she is having allergy symptoms, she needs to take OTC antihistamines also (and we may want to consider other meds also)  It would be a good idea for her to be seen also, offer appt today if she can come in

## 2016-08-12 ENCOUNTER — Encounter: Payer: Self-pay | Admitting: Internal Medicine

## 2016-08-12 ENCOUNTER — Ambulatory Visit (INDEPENDENT_AMBULATORY_CARE_PROVIDER_SITE_OTHER): Payer: Managed Care, Other (non HMO) | Admitting: Internal Medicine

## 2016-08-12 VITALS — BP 118/90 | HR 97 | Temp 98.2°F | Wt 190.0 lb

## 2016-08-12 DIAGNOSIS — J4521 Mild intermittent asthma with (acute) exacerbation: Secondary | ICD-10-CM | POA: Diagnosis not present

## 2016-08-12 DIAGNOSIS — J45901 Unspecified asthma with (acute) exacerbation: Secondary | ICD-10-CM | POA: Insufficient documentation

## 2016-08-12 MED ORDER — PREDNISONE 20 MG PO TABS: 40.0000 mg | ORAL_TABLET | Freq: Every day | ORAL | 0 refills | Status: DC

## 2016-08-12 NOTE — Progress Notes (Signed)
Subjective:    Patient ID: Yvonne Stephenson, female    DOB: 1966/02/17, 50 y.o.   MRN: [id number]  HPI Here due to ongoing issues with asthma  Was having a hard time after going [place] 5 days ago Noticed trouble breathing after that School nurse thought lungs were clear Inhaler helped a bit  Did restart the qvar at end of last week--uses spacer Seemed to help but then worsening in past 1-2 days SOB even going up and down stairs Cough with deep breath Some PND Sore throat --better now (just scratchy) Left ear pain  Current Outpatient Prescriptions on File Prior to Visit  Medication Sig Dispense Refill  . albuterol (PROAIR HFA) 108 (90 Base) MCG/ACT inhaler Inhale 2 puffs into the lungs 3 (three) times daily as needed for wheezing or shortness of breath. 8.5 each 2  . beclomethasone (QVAR) 40 MCG/ACT inhaler Inhale 2 puffs into the lungs 2 (two) times daily. 1 Inhaler 3  . bisoprolol (ZEBETA) 5 MG tablet TAKE 1 TABLET BY MOUTH EVERY DAY 90 tablet 0  . celecoxib (CELEBREX) 200 MG capsule Take 1 capsule (200 mg total) by mouth 2 (two) times daily. 30 capsule 3  . LORazepam (ATIVAN) 0.5 MG tablet 1-2 tabs at bedtime as needed for insomnia 20 tablet 0  . omeprazole (PRILOSEC) 40 MG capsule Take 40 mg by mouth daily.     . sucralfate (CARAFATE) 1 GM/10ML suspension Take 10 mLs (1 g total) by mouth 2 (two) times daily. 1680 mL 3   No current facility-administered medications on file prior to visit.     Allergies  Allergen Reactions  . Penicillins Shortness Of Breath    REACTION: difficulty breathing  . Amoxicillin-Pot Clavulanate Swelling    Tongue swelling  . Atorvastatin     REACTION: hip pain  . Cefprozil     REACTION: rash  . Demerol Nausea And Vomiting  . Dilaudid [Hydromorphone Hcl] Nausea And Vomiting  . Doxycycline Other (See Comments)    Severe Abdominal Pain  . Meloxicam [Meloxicam] Swelling    Face swelling and lip tingling  . Zithromax  [Azithromycin]     REACTION: tongue felt funny    Past Medical History:  Diagnosis Date  . Anxiety   . Asthma    only when gets a cold-expired inhaler  . GERD (gastroesophageal reflux disease)   . Hyperlipidemia   . Hypertension   . PONV (postoperative nausea and vomiting)     Past Surgical History:  Procedure Laterality Date  . ABDOMINAL HYSTERECTOMY  07-2006  . BREAST SURGERY  2004   LUMPECTOMY  . CARPAL TUNNEL RELEASE  4-10   LEFT (Dr. Tommie Raymond)  . CESAREAN SECTION     3  . LAPAROSCOPY  04/09/2012   Procedure: LAPAROSCOPY OPERATIVE;  Surgeon: Donnamae Jude, MD;  Location: [facility];  Service: Gynecology;  Laterality: N/A;  . NASAL STENOSIS REPAIR  2003   . SALPINGOOPHORECTOMY  04/09/2012   Procedure: SALPINGO OOPHERECTOMY;  Surgeon: Donnamae Jude, MD;  Location: [facility];  Service: Gynecology;  Laterality: Right;  . TUBAL LIGATION      Family History  Problem Relation Age of Onset  . Heart attack Mother   . Cancer Mother     lung  . Heart disease Mother   . Hypertension Mother   . Stroke Mother   . Heart attack Father   . Heart disease Father   . Hypertension Father   . Diabetes Maternal Uncle   .  Cancer Maternal Grandmother   . Heart failure Maternal Grandfather   . Heart failure Paternal Grandmother     Social History   Social History  . Marital status: Married    Spouse name: N/A  . Number of children: 3  . 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      Social History Main Topics  . Smoking status: Never Smoker  . Smokeless tobacco: Never Used  . Alcohol use Yes     Comment: occasionaly  . Drug use: No  . Sexual activity: No   Other Topics Concern  . Not on file   Social History Narrative   Does walk a little for exercise   Review of Systems No vomiting or diarrhea Sleep has been affected by cough    Objective:   Physical Exam  Constitutional: No distress.  HENT:    Mouth/Throat: Oropharynx is clear and moist. No oropharyngeal exudate.  Mild nasal inflammation No sinus tenderness  TMs normal  Neck: Normal range of motion. Neck supple. No thyromegaly present.  Pulmonary/Chest: Effort normal and breath sounds normal. No respiratory distress. She has no wheezes. She has no rales.  Lymphadenopathy:    She has no cervical adenopathy.          Assessment & Plan:

## 2016-08-12 NOTE — Patient Instructions (Addendum)
Call me later in the week or next week if your symptoms aren't resolved--we will consider adding singulair. Continue the qvar and cetirizine till the end of the fall allergy season.

## 2016-08-12 NOTE — Assessment & Plan Note (Addendum)
Probably due to allergic exposure Back on qvar for now--probably needs it till end of fall season Albuterol prn Will give burst of prednisone Consider montelukast if ongoing symptoms later this month Continue OTC antihistamine--cetirizine

## 2016-08-12 NOTE — Progress Notes (Signed)
Pre visit review using our clinic review tool, if applicable. No additional management support is needed unless otherwise documented below in the visit note. 

## 2016-08-17 ENCOUNTER — Encounter (HOSPITAL_COMMUNITY): Payer: Self-pay | Admitting: Family Medicine

## 2016-08-17 ENCOUNTER — Ambulatory Visit (INDEPENDENT_AMBULATORY_CARE_PROVIDER_SITE_OTHER): Payer: Managed Care, Other (non HMO)

## 2016-08-17 ENCOUNTER — Ambulatory Visit (HOSPITAL_COMMUNITY)
Admission: EM | Admit: 2016-08-17 | Discharge: 2016-08-17 | Disposition: A | Discharge status: Home / Self Care | Payer: Managed Care, Other (non HMO) | Attending: Family Medicine | Admitting: Family Medicine

## 2016-08-17 DIAGNOSIS — J4521 Mild intermittent asthma with (acute) exacerbation: Secondary | ICD-10-CM

## 2016-08-17 DIAGNOSIS — M84377A Stress fracture, right toe(s), initial encounter for fracture: Secondary | ICD-10-CM | POA: Diagnosis not present

## 2016-08-17 MED ORDER — ALBUTEROL SULFATE (2.5 MG/3ML) 0.083% IN NEBU
5.0000 mg | INHALATION_SOLUTION | Freq: Once | RESPIRATORY_TRACT | Status: AC
  Administered 2016-08-17: 5 mg via RESPIRATORY_TRACT

## 2016-08-17 MED ORDER — METHYLPREDNISOLONE SODIUM SUCC 125 MG IJ SOLR
125.0000 mg | Freq: Once | INTRAMUSCULAR | Status: AC
  Administered 2016-08-17: 125 mg via INTRAMUSCULAR

## 2016-08-17 MED ORDER — METHYLPREDNISOLONE SODIUM SUCC 125 MG IJ SOLR
INTRAMUSCULAR | Status: AC
  Filled 2016-08-17: qty 2

## 2016-08-17 MED ORDER — PREDNISONE 50 MG PO TABS: ORAL_TABLET | ORAL | 0 refills | Status: DC

## 2016-08-17 MED ORDER — IPRATROPIUM BROMIDE 0.02 % IN SOLN
RESPIRATORY_TRACT | Status: AC
  Filled 2016-08-17: qty 2.5

## 2016-08-17 MED ORDER — IPRATROPIUM BROMIDE 0.02 % IN SOLN
0.5000 mg | Freq: Once | RESPIRATORY_TRACT | Status: AC
  Administered 2016-08-17: 0.5 mg via RESPIRATORY_TRACT

## 2016-08-17 NOTE — ED Notes (Signed)
Peek flow improved after breathing treatment. Slightly over 250.

## 2016-08-17 NOTE — ED Triage Notes (Signed)
Pt here for coughing, SOB, wheezing that has worsened since yesterday. Pt has been taking steroids, cough meds and inhaler without relief. sts rattling in chest.

## 2016-08-17 NOTE — ED Provider Notes (Signed)
[facility]    CSN: [id number] Arrival date & time: 08/17/16  1657     History   Chief Complaint Chief Complaint  Patient presents with  . Cough  . Wheezing    HPI Yvonne Stephenson is a 50 y.o. female.   The history is provided by the patient and the spouse.  Cough  Cough characteristics:  Non-productive and harsh Severity:  Moderate Onset quality:  Gradual Duration:  5 days Progression:  Worsening Chronicity:  New Smoker: no   Context: weather changes   Context comment:  Seen by lmd on mon and given steroids and inhaler but no relief Worsened by:  Nothing Ineffective treatments:  Beta-agonist inhaler and steroid inhaler Associated symptoms: wheezing   Associated symptoms: no fever   Wheezing  Associated symptoms: cough   Associated symptoms: no fever     Past Medical History:  Diagnosis Date  . Anxiety   . Asthma    only when gets a cold-expired inhaler  . GERD (gastroesophageal reflux disease)   . Hyperlipidemia   . Hypertension   . PONV (postoperative nausea and vomiting)     Patient Active Problem List   Diagnosis Date Noted  . Asthma with acute exacerbation 08/12/2016  . Otitis externa of left ear 05/13/2016  . Bowel habit changes 10/26/2015  . Routine general medical examination at a health care facility 07/10/2012  . Fatty infiltration of liver 01/14/2012  . Anxiety attack 11/01/2011  . Hypertriglyceridemia 09/10/2007  . ANXIETY 09/10/2007  . HYPERTENSION 09/10/2007  . GERD 09/10/2007    Past Surgical History:  Procedure Laterality Date  . ABDOMINAL HYSTERECTOMY  07-2006  . BREAST SURGERY  2004   LUMPECTOMY  . CARPAL TUNNEL RELEASE  4-10   LEFT (Dr. Tommie Raymond)  . CESAREAN SECTION     3  . LAPAROSCOPY  04/09/2012   Procedure: LAPAROSCOPY OPERATIVE;  Surgeon: Donnamae Jude, MD;  Location: [facility];  Service: Gynecology;  Laterality: N/A;  . NASAL STENOSIS REPAIR  2003   . SALPINGOOPHORECTOMY  04/09/2012   Procedure: SALPINGO  OOPHERECTOMY;  Surgeon: Donnamae Jude, MD;  Location: [facility];  Service: Gynecology;  Laterality: Right;  . TUBAL LIGATION      OB History    Gravida Para Term Preterm AB Living   4 3     1 3    SAB TAB Ectopic Multiple Live Births   1               Home Medications    Prior to Admission medications   Medication Sig Start Date End Date Taking? Authorizing Provider  albuterol (PROAIR HFA) 108 (90 Base) MCG/ACT inhaler Inhale 2 puffs into the lungs 3 (three) times daily as needed for wheezing or shortness of breath. 02/08/16   Owens Loffler, MD  beclomethasone (QVAR) 40 MCG/ACT inhaler Inhale 2 puffs into the lungs 2 (two) times daily. 08/09/16   Venia Carbon, MD  bisoprolol (ZEBETA) 5 MG tablet TAKE 1 TABLET BY MOUTH EVERY DAY 07/05/16   Venia Carbon, MD  celecoxib (CELEBREX) 200 MG capsule Take 1 capsule (200 mg total) by mouth 2 (two) times daily. 04/08/16   Max T Hyatt, DPM  LORazepam (ATIVAN) 0.5 MG tablet 1-2 tabs at bedtime as needed for insomnia 06/24/16   Venia Carbon, MD  omeprazole (PRILOSEC) 40 MG capsule Take 40 mg by mouth daily.     Historical Provider, MD  predniSONE (DELTASONE) 20 MG tablet Take 2 tablets (40  mg total) by mouth daily. For 3 days, then 1 daily for 3 days 08/12/16   Venia Carbon, MD  sucralfate (CARAFATE) 1 GM/10ML suspension Take 10 mLs (1 g total) by mouth 2 (two) times daily. 03/15/15   Venia Carbon, MD    Family History Family History  Problem Relation Age of Onset  . Heart attack Mother   . Cancer Mother     lung  . Heart disease Mother   . Hypertension Mother   . Stroke Mother   . Heart attack Father   . Heart disease Father   . Hypertension Father   . Diabetes Maternal Uncle   . Cancer Maternal Grandmother   . Heart failure Maternal Grandfather   . Heart failure Paternal Grandmother     Social History Social History  Substance Use Topics  . Smoking status: Never Smoker  . Smokeless tobacco: Never Used  . Alcohol use  Yes     Comment: occasionaly     Allergies   Penicillins; Amoxicillin-pot clavulanate; Atorvastatin; Cefprozil; Demerol; Dilaudid [hydromorphone hcl]; Doxycycline; Meloxicam [meloxicam]; and Zithromax [azithromycin]   Review of Systems Review of Systems  Constitutional: Negative.  Negative for fever.  HENT: Negative.   Respiratory: Positive for cough and wheezing.   Cardiovascular: Negative.   All other systems reviewed and are negative.    Physical Exam Triage Vital Signs ED Triage Vitals  Enc Vitals Group     BP 08/17/16 1807 (!) 153/107     Pulse Rate 08/17/16 1807 103     Resp 08/17/16 1807 20     Temp 08/17/16 1807 98.3 F (36.8 C)     Temp src --      SpO2 08/17/16 1807 97 %     Weight --      Height --      Head Circumference --      Peak Flow --      Pain Score 08/17/16 1808 6     Pain Loc --      Pain Edu? --      Excl. in [place]? --    No data found.   Updated Vital Signs BP (!) 153/107   Pulse 103   Temp 98.3 F (36.8 C)   Resp 20   SpO2 97%   Visual Acuity Right Eye Distance:   Left Eye Distance:   Bilateral Distance:    Right Eye Near:   Left Eye Near:    Bilateral Near:     Physical Exam  Constitutional: She is oriented to person, place, and time. She appears well-developed and well-nourished. No distress.  HENT:  Right Ear: External ear normal.  Left Ear: External ear normal.  Mouth/Throat: Oropharynx is clear and moist.  Neck: Normal range of motion. Neck supple.  Cardiovascular: Normal rate, regular rhythm, normal heart sounds and intact distal pulses.   Pulmonary/Chest: Effort normal. She has wheezes. She has rales.  Lymphadenopathy:    She has no cervical adenopathy.  Neurological: She is alert and oriented to person, place, and time.  Skin: Skin is warm and dry.  Nursing note and vitals reviewed.    UC Treatments / Results  Labs (all labs ordered are listed, but only abnormal results are displayed) Labs Reviewed - No data  to display  EKG  EKG Interpretation None       Radiology No results found. X-rays reviewed and report per radiologist.  Procedures Procedures (including critical care time)  Medications Ordered in UC Medications -  No data to display   Initial Impression / Assessment and Plan / UC Course  I have reviewed the triage vital signs and the nursing notes.  Pertinent labs & imaging results that were available during my care of the patient were reviewed by me and considered in my medical decision making (see chart for details).  Clinical Course   Sx improved and lungs clear after neb, peak flow improved.  Final Clinical Impressions(s) / UC Diagnoses   Final diagnoses:  None    New Prescriptions New Prescriptions   No medications on file     Billy Fischer, MD 08/17/16 Joen Laura

## 2016-08-19 ENCOUNTER — Telehealth: Payer: Self-pay | Admitting: Internal Medicine

## 2016-08-19 DIAGNOSIS — J4521 Mild intermittent asthma with (acute) exacerbation: Secondary | ICD-10-CM

## 2016-08-19 NOTE — Telephone Encounter (Signed)
Patient returned Shannon's call.  Patient wants to see a pulmonary specialist.  Patient prefers [facility].  Patient prefers a late afternoon appointment or this Friday after 12:30.

## 2016-08-19 NOTE — Telephone Encounter (Signed)
Order placed

## 2016-08-19 NOTE — Telephone Encounter (Signed)
Find out if she wants to see pulmonary specialist. This is still an acute reaction so not clear cut

## 2016-08-19 NOTE — Telephone Encounter (Signed)
Left message to call office

## 2016-08-19 NOTE — Telephone Encounter (Signed)
P twent to urgent care over weekend, was dx with asthma, and suggested to be referred to allergy/asthma specialist. Please call  [phone] Leave message is no answer  Thanks

## 2016-08-26 ENCOUNTER — Ambulatory Visit (INDEPENDENT_AMBULATORY_CARE_PROVIDER_SITE_OTHER): Payer: Managed Care, Other (non HMO) | Admitting: Internal Medicine

## 2016-08-26 ENCOUNTER — Encounter: Payer: Self-pay | Admitting: Internal Medicine

## 2016-08-26 VITALS — BP 122/80 | HR 92 | Ht 61.0 in | Wt 195.0 lb

## 2016-08-26 DIAGNOSIS — J45991 Cough variant asthma: Secondary | ICD-10-CM | POA: Diagnosis not present

## 2016-08-26 DIAGNOSIS — J4520 Mild intermittent asthma, uncomplicated: Secondary | ICD-10-CM | POA: Insufficient documentation

## 2016-08-26 LAB — NITRIC OXIDE: Nitric Oxide: 8

## 2016-08-26 NOTE — Patient Instructions (Addendum)
Prilosec 40 Take 30-60 min before first meal of the day and Pepcid 20 mg after supper   For drainage / throat tickle try take CHLORPHENIRAMINE  4 mg - take one every 4 hours as needed - available over the counter- may cause drowsiness so start with just a bedtime dose or two and see how you tolerate it before trying in daytime    GERD (REFLUX)  is an extremely common cause of respiratory symptoms just like yours , many times with no obvious heartburn at all.    It can be treated with medication, but also with lifestyle changes including elevation of the head of your bed (ideally with 6 inch  bed blocks),  Smoking cessation, avoidance of late meals, excessive alcohol, and avoid fatty foods, chocolate, peppermint, colas, red wine, and acidic juices such as orange juice.  NO MINT OR MENTHOL PRODUCTS SO NO COUGH DROPS   USE SUGARLESS CANDY INSTEAD (Jolley ranchers or Stover's or Life Savers) or even ice chips will also do - the key is to swallow to prevent all throat clearing. NO OIL BASED VITAMINS - use powdered substitutes.    If not improving 100% next step is 6 more days of prednisone  Only use the albuterol if you can't catch your breath  For cough > tessalon 200 mg up to every 4 hours as needed   We will call you for sinus CT

## 2016-08-26 NOTE — Progress Notes (Signed)
Subjective:    Patient ID: Yvonne Stephenson, female    DOB: [birth year],    MRN: [id number]  HPI  26 yowm never smoker  with ? 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 Pulmonary Consult    Referred by Dr. Silvio Pate. Pt c/o cough and SOB for the past 3 wks. Her cough is non prod. She states she gets SOB with walking up stairs. Her voice has been raspy. She is using proair 2-3 x daily on average.    when last better eg sep 2017 was taking carafate once in am / omeprazole 40 mg before bfast   Acutely 1st week in October  Not present on awakening some worse with cold air expose, in car line (that was not true in September)  Last prednisone was 08/21/16 50% better  Cough with insp prev to prednisone but not p pred rx by er   Worse p supper and hs / ? Some better p saba but none on day of ov    No obvious day to day or daytime variability or assoc excess/ purulent sputum or mucus plugs or hemoptysis or cp or chest tightness, subjective wheeze or overt sinus or hb symptoms. No unusual exp hx or h/o childhood pna/ asthma or knowledge of premature birth.  Sleeping ok without nocturnal  or early am exacerbation  of respiratory  c/o's or need for noct saba. Also denies any obvious fluctuation of symptoms with weather or environmental changes or other aggravating or alleviating factors except as outlined above   Current Medications, Allergies, Complete Past Medical History, Past Surgical  History, Family History, and Social History were reviewed in [employer] record.     Review of Systems  Constitutional: Negative for chills, fever and unexpected weight change.  HENT: Positive for sore throat and voice change. Negative for congestion, dental problem, ear pain, nosebleeds, postnasal drip, rhinorrhea, sinus pressure, sneezing and trouble swallowing.   Eyes: Negative for visual disturbance.  Respiratory: Positive for cough and shortness of breath. Negative for choking.   Cardiovascular: Negative for chest pain and leg swelling.  Gastrointestinal: Negative for abdominal pain, diarrhea and vomiting.  Genitourinary: Negative for difficulty urinating.  Musculoskeletal: Negative for arthralgias.  Skin: Negative for rash.  Neurological: Negative for tremors, syncope and headaches.  Hematological: Does not bruise/bleed easily.       Objective:   Physical Exam   amb wf nad  Wt Readings from Last 3 Encounters:  08/26/16 195 lb (88.5 kg)  08/12/16 190 lb (86.2 kg)  05/13/16 193 lb (87.5 kg)    Vital signs reviewed    HEENT: nl dentition, turbinates, and oropharynx. Nl external ear canals without cough reflex   NECK :  without JVD/Nodes/TM/ nl carotid upstrokes bilaterally   LUNGS: no acc muscle use,  Nl contour chest which is clear to A and P bilaterally without cough  on insp or exp maneuvers   CV:  RRR  no s3 or murmur or increase in P2, no edema   ABD:  soft and nontender with nl inspiratory excursion in the supine position. No bruits or organomegaly, bowel sounds nl  MS:  Nl gait/ ext warm without deformities, calf tenderness, cyanosis or clubbing No obvious joint restrictions   SKIN: warm and dry without lesions    NEURO:  alert, approp, nl sensorium with  no motor deficits      I personally reviewed images and agree with radiology impression as follows:  CXR:   08/17/16 No active cardiopulmonary disease.     Assessment &  Plan:

## 2016-08-27 ENCOUNTER — Encounter: Payer: Self-pay | Admitting: Internal Medicine

## 2016-08-27 NOTE — Assessment & Plan Note (Signed)
FENO 08/26/2016  =   8 - Spirometry 08/26/2016 wnl including mid flows/ curve of f/v off all rx  - Sinus ct 08/27/2016 >>>   The most common causes of chronic cough in immunocompetent adults include the following: upper airway cough syndrome (UACS), previously referred to as postnasal drip syndrome (PNDS), which is caused by variety of rhinosinus conditions; (2) asthma; (3) GERD; (4) chronic bronchitis from cigarette smoking or other inhaled environmental irritants; (5) nonasthmatic eosinophilic bronchitis; and (6) bronchiectasis.   These conditions, singly or in combination, have accounted for up to 94% of the causes of chronic cough in prospective studies.   Other conditions have constituted no >6% of the causes in prospective studies These have included bronchogenic carcinoma, chronic interstitial pneumonia, sarcoidosis, left ventricular failure, ACEI-induced cough, and aspiration from a condition associated with pharyngeal dysfunction.    Chronic cough is often simultaneously caused by more than one condition. A single cause has been found from 38 to 82% of the time, multiple causes from 18 to 62%. Multiply caused cough has been the result of three diseases up to 42% of the time.       Based on hx and exam, this is most likely:  Classic Upper airway cough syndrome, so named because it's frequently impossible to sort out how much is  CR/sinusitis with freq throat clearing (which can be related to primary GERD)   vs  causing  secondary (" extra esophageal")  GERD from wide swings in gastric pressure that occur with throat clearing, often  promoting self use of mint and menthol lozenges that reduce the lower esophageal sphincter tone and exacerbate the problem further in a cyclical fashion.   These are the same pts (now being labeled as having "irritable larynx syndrome" by some cough centers) who not infrequently have a history of having failed to tolerate ace inhibitors,  dry powder inhalers or  biphosphonates or report having atypical reflux symptoms that don't respond to standard doses of PPI , and are easily confused as having aecopd or asthma flares by even experienced allergists/ pulmonologists.   The first step is to maximize GERD Rx  and eliminate cyclical coughing with only additional w/u for now - sinus ct >> then regroup if the cough persists in 2 weeks   The standardized cough guidelines published in Chest by Lissa Morales in 2006 are still the best available and consist of a multiple step process (up to 12!) , not a single office visit,  and are intended  to address this problem logically,  with an alogrithm dependent on response to empiric treatment at  each progressive step  to determine a specific diagnosis with  minimal addtional testing needed. Therefore if adherence is an issue or can't be accurately verified,  it's very unlikely the standard evaluation and treatment will be successful here.    Furthermore, response to therapy (other than acute cough suppression, which should only be used short term with avoidance of narcotic containing cough syrups if possible), can be a gradual process for which the patient is not likely to  perceive immediate benefit.  Unlike going to an eye doctor where the best perscription is almost always the first one and is immediately effective, this is almost never the case in the management of chronic cough syndromes. Therefore the patient needs to commit up front to consistently adhere to recommendations  for up to 6 weeks of therapy directed at the likely underlying problem(s) before the response can be reasonably evaluated.  Total time devoted to counseling  = 35/55m review case with pt/ discussion of options/alternatives/ personally creating written instructions  in presence of pt  then going over those specific  Instructions directly with the pt including how to use all of the meds but in particular covering each new medication in detail and  the difference between the maintenance/automatic meds and the prns using an action plan format for the latter.

## 2016-09-09 ENCOUNTER — Other Ambulatory Visit: Payer: Self-pay | Admitting: Internal Medicine

## 2016-09-11 ENCOUNTER — Other Ambulatory Visit: Payer: Self-pay | Admitting: Internal Medicine

## 2016-09-12 ENCOUNTER — Telehealth: Payer: Self-pay

## 2016-09-12 MED ORDER — SUCRALFATE 1 GM/10ML PO SUSP: 1.0000 g | Freq: Two times a day (BID) | ORAL | 2 refills | Status: DC

## 2016-09-12 NOTE — Telephone Encounter (Signed)
Patient calls to request a doctor's note to return to full work duties.  About 3 weeks ago the ER doc wrote her out of outdoor requirements with her work due to asthma triggers from cold and exhaust.  She is better and requesting a return to work note.  Can Dr. Silvio Pate provide?  Please advise.

## 2016-09-12 NOTE — Addendum Note (Signed)
Addended by: Pilar Grammes on: 09/12/2016 05:14 PM   Modules accepted: Orders

## 2016-09-13 NOTE — Telephone Encounter (Signed)
Okay to write her a return to work note

## 2016-09-13 NOTE — Telephone Encounter (Signed)
Left message on vm that letter was up front

## 2016-10-09 ENCOUNTER — Other Ambulatory Visit: Payer: Self-pay | Admitting: Obstetrics & Gynecology

## 2016-10-09 DIAGNOSIS — Z1231 Encounter for screening mammogram for malignant neoplasm of breast: Secondary | ICD-10-CM

## 2016-10-15 ENCOUNTER — Other Ambulatory Visit: Payer: Self-pay | Admitting: Internal Medicine

## 2016-11-12 ENCOUNTER — Ambulatory Visit
Admission: RE | Admit: 2016-11-12 | Discharge: 2016-11-12 | Disposition: A | Discharge status: Home / Self Care | Payer: Managed Care, Other (non HMO) | Source: Ambulatory Visit | Attending: Obstetrics & Gynecology | Admitting: Obstetrics & Gynecology

## 2016-11-12 DIAGNOSIS — Z1231 Encounter for screening mammogram for malignant neoplasm of breast: Secondary | ICD-10-CM

## 2016-11-15 ENCOUNTER — Other Ambulatory Visit: Payer: Self-pay | Admitting: Obstetrics & Gynecology

## 2016-11-15 DIAGNOSIS — R928 Other abnormal and inconclusive findings on diagnostic imaging of breast: Secondary | ICD-10-CM

## 2016-11-26 ENCOUNTER — Other Ambulatory Visit: Payer: Self-pay | Admitting: Obstetrics & Gynecology

## 2016-11-26 ENCOUNTER — Ambulatory Visit
Admission: RE | Admit: 2016-11-26 | Discharge: 2016-11-26 | Disposition: A | Discharge status: Home / Self Care | Payer: Managed Care, Other (non HMO) | Source: Ambulatory Visit | Attending: Obstetrics & Gynecology | Admitting: Obstetrics & Gynecology

## 2016-11-26 DIAGNOSIS — R921 Mammographic calcification found on diagnostic imaging of breast: Secondary | ICD-10-CM

## 2016-11-26 DIAGNOSIS — R928 Other abnormal and inconclusive findings on diagnostic imaging of breast: Secondary | ICD-10-CM

## 2016-11-27 ENCOUNTER — Encounter: Payer: Self-pay | Admitting: Obstetrics & Gynecology

## 2016-11-27 DIAGNOSIS — R928 Other abnormal and inconclusive findings on diagnostic imaging of breast: Secondary | ICD-10-CM | POA: Insufficient documentation

## 2016-11-28 ENCOUNTER — Other Ambulatory Visit: Payer: Self-pay | Admitting: Obstetrics & Gynecology

## 2016-11-28 DIAGNOSIS — R921 Mammographic calcification found on diagnostic imaging of breast: Secondary | ICD-10-CM

## 2016-12-02 ENCOUNTER — Ambulatory Visit
Admission: RE | Admit: 2016-12-02 | Discharge: 2016-12-02 | Disposition: A | Discharge status: Home / Self Care | Payer: Managed Care, Other (non HMO) | Source: Ambulatory Visit | Attending: Obstetrics & Gynecology | Admitting: Obstetrics & Gynecology

## 2016-12-02 DIAGNOSIS — R921 Mammographic calcification found on diagnostic imaging of breast: Secondary | ICD-10-CM

## 2017-01-27 ENCOUNTER — Other Ambulatory Visit: Payer: Self-pay | Admitting: Internal Medicine

## 2017-03-24 ENCOUNTER — Other Ambulatory Visit: Payer: Self-pay | Admitting: Family Medicine

## 2017-04-17 ENCOUNTER — Telehealth: Payer: Self-pay | Admitting: Internal Medicine

## 2017-04-17 NOTE — Telephone Encounter (Signed)
Patient Name: Yvonne Stephenson  DOB: 22-Apr-1966    Initial Comment Caller States she has been feeling Nauseous, freezing at times, pain in the rt flank, using the bathroom more than usual. No appetite.   Nurse Assessment  Nurse: Mallie Mussel, RN, Alveta Heimlich Date/Time Eilene Ghazi Time): 04/17/2017 10:35:14 AM  Confirm and document reason for call. If symptomatic, describe symptoms. ---Caller states that she has right flank area which began Monday night. She rates her pain as 4 on 0-10 scale. She denies pain/burning with urination. Unsure if she has had a fever, but she has been freezing at times. Denies blood in her urine.  Does the patient have any new or worsening symptoms? ---Yes  Will a triage be completed? ---Yes  Related visit to physician within the last 2 weeks? ---No  Does the PT have any chronic conditions? (i.e. diabetes, asthma, etc.) ---Yes  List chronic conditions. ---Asthma, HTN, Reflux  Is the patient pregnant or possibly pregnant? (Ask all females between the ages of 20-55) ---No  Is this a behavioral health or substance abuse call? ---No     Guidelines    Guideline Title Affirmed Question Affirmed Notes  Flank Pain MODERATE pain (e.g., interferes with normal activities or awakens from sleep)    Final Disposition User   See Physician within [facility], RN, Alveta Heimlich    Comments  Appointment scheduled for tomnorrow at 11:00am with Dr. Silvio Pate.   Referrals  REFERRED TO PCP OFFICE   Disagree/Comply: Comply

## 2017-04-17 NOTE — Telephone Encounter (Signed)
Pt has appt with Dr Silvio Pate 04/18/17 at 11AM

## 2017-04-17 NOTE — Telephone Encounter (Signed)
Will assess at OV tomorrow 

## 2017-04-18 ENCOUNTER — Ambulatory Visit (INDEPENDENT_AMBULATORY_CARE_PROVIDER_SITE_OTHER): Payer: Managed Care, Other (non HMO) | Admitting: Internal Medicine

## 2017-04-18 ENCOUNTER — Encounter: Payer: Self-pay | Admitting: Internal Medicine

## 2017-04-18 DIAGNOSIS — B001 Herpesviral vesicular dermatitis: Secondary | ICD-10-CM | POA: Diagnosis not present

## 2017-04-18 DIAGNOSIS — K529 Noninfective gastroenteritis and colitis, unspecified: Secondary | ICD-10-CM | POA: Diagnosis not present

## 2017-04-18 LAB — POC URINALSYSI DIPSTICK (AUTOMATED)
Bilirubin, UA: NEGATIVE
Blood, UA: NEGATIVE
Glucose, UA: NEGATIVE
Ketones, UA: NEGATIVE
Leukocytes, UA: NEGATIVE
Nitrite, UA: NEGATIVE
Protein, UA: NEGATIVE
Spec Grav, UA: 1.025 (ref 1.010–1.025)
Urobilinogen, UA: 0.2 E.U./dL
pH, UA: 6 (ref 5.0–8.0)

## 2017-04-18 MED ORDER — ACYCLOVIR 5 % EX OINT: 1.0000 "application " | TOPICAL_OINTMENT | CUTANEOUS | 2 refills | Status: DC

## 2017-04-18 NOTE — Addendum Note (Signed)
Addended by: Pilar Grammes on: 04/18/2017 12:09 PM   Modules accepted: Orders

## 2017-04-18 NOTE — Assessment & Plan Note (Signed)
Seems to have had a self limited apparent viral illness No dehydration Clinically improving now Urine negative Discussed supportive care

## 2017-04-18 NOTE — Progress Notes (Signed)
Subjective:    Patient ID: Yvonne Stephenson, female    DOB: 1966-06-27, 51 y.o.   MRN: [id number]  HPI Here due to acute illness  Was out of town in [place] 4 days ago Terribly hot Started with epigastric pain ~11PM Started with diarrhea Exhausted 3 days ago--came home. More diarrhea No vomiting but lots of chills Appetite off--forcing herself to drink  2 days ago -- awoke with sore over right lip Itchy Tried antibiotic and campho-phenique (helped itching) Chills, diarrhea recurred 2 nights ago Slight diarrhea yesterday but none overnight Drinking gatorade Feels somewhat better today  No pain now--resolved through day yesterday  Current Outpatient Prescriptions on File Prior to Visit  Medication Sig Dispense Refill  . bisoprolol (ZEBETA) 5 MG tablet TAKE 1 TABLET BY MOUTH EVERY DAY 90 tablet 0  . PROAIR HFA 108 (90 Base) MCG/ACT inhaler INHALE 2 PUFFS INTO THE LUNGS 3 (THREE) TIMES DAILY AS NEEDED FOR WHEEZING OR SHORTNESS OF BREATH. 8.5 Inhaler 5  . sucralfate (CARAFATE) 1 GM/10ML suspension Take 10 mLs (1 g total) by mouth 2 (two) times daily. 1680 mL 2   No current facility-administered medications on file prior to visit.     Allergies  Allergen Reactions  . Penicillins Shortness Of Breath    REACTION: difficulty breathing  . Amoxicillin-Pot Clavulanate Swelling    Tongue swelling  . Atorvastatin     REACTION: hip pain  . Cefprozil     REACTION: rash  . Demerol Nausea And Vomiting  . Dilaudid [Hydromorphone Hcl] Nausea And Vomiting  . Doxycycline Other (See Comments)    Severe Abdominal Pain  . Meloxicam [Meloxicam] Swelling    Face swelling and lip tingling  . Zithromax [Azithromycin]     REACTION: tongue felt funny    Past Medical History:  Diagnosis Date  . Anxiety   . Asthma    only when gets a cold-expired inhaler  . GERD (gastroesophageal reflux disease)   . Hyperlipidemia   . Hypertension   . PONV (postoperative nausea and vomiting)      Past Surgical History:  Procedure Laterality Date  . ABDOMINAL HYSTERECTOMY  07-2006  . BREAST SURGERY  2004   LUMPECTOMY  . CARPAL TUNNEL RELEASE  4-10   LEFT (Dr. Tommie Raymond)  . CESAREAN SECTION     3  . LAPAROSCOPY  04/09/2012   Procedure: LAPAROSCOPY OPERATIVE;  Surgeon: Donnamae Jude, MD;  Location: [facility];  Service: Gynecology;  Laterality: N/A;  . NASAL STENOSIS REPAIR  2003   . SALPINGOOPHORECTOMY  04/09/2012   Procedure: SALPINGO OOPHERECTOMY;  Surgeon: Donnamae Jude, MD;  Location: [facility];  Service: Gynecology;  Laterality: Right;  . TUBAL LIGATION      Family History  Problem Relation Age of Onset  . Heart attack Mother   . Heart disease Mother   . Hypertension Mother   . Stroke Mother   . Lung cancer Mother        smoked  . Allergies Mother   . Heart attack Father   . Heart disease Father   . Hypertension Father   . Diabetes Maternal Uncle   . Cancer Maternal Grandmother   . Heart failure Maternal Grandfather   . Heart failure Paternal Grandmother     Social History   Social History  . Marital status: Married    Spouse name: N/A  . Number of children: 3  . Years of education: N/A   Occupational History  . 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      Social History Main Topics  . Smoking status: Never Smoker  . Smokeless tobacco: Never Used  . Alcohol use Yes     Comment: occasionaly  . Drug use: No  . Sexual activity: No   Other Topics Concern  . Not on file   Social History Narrative   Does walk a little for exercise   Review of Systems No fever No cough or SOB No other rash noone else is sick No dysuria or hematuria Has had some fever blisters in past--but nothing like this Lightheaded for a couple of days at start of illness    Objective:   Physical Exam  HENT:  Mouth/Throat: Oropharynx is clear and moist. No oropharyngeal exudate.  Complex vesicular cold sore (~49mm) over right lip  Pulmonary/Chest:  Effort normal and breath sounds normal. No respiratory distress. She has no wheezes. She has no rales.  Abdominal: Soft. Bowel sounds are normal. She exhibits no distension. There is no tenderness. There is no rebound and no guarding.          Assessment & Plan:

## 2017-04-18 NOTE — Assessment & Plan Note (Signed)
Worse then ever before Didn't tolerate borrowed valacyclovir in past Will try acyclovir cream (not crusted yet)

## 2017-04-29 ENCOUNTER — Other Ambulatory Visit: Payer: Self-pay | Admitting: Internal Medicine

## 2017-08-12 ENCOUNTER — Ambulatory Visit (INDEPENDENT_AMBULATORY_CARE_PROVIDER_SITE_OTHER): Payer: Managed Care, Other (non HMO) | Admitting: Internal Medicine

## 2017-08-12 ENCOUNTER — Other Ambulatory Visit: Payer: Self-pay | Admitting: Internal Medicine

## 2017-08-12 ENCOUNTER — Encounter: Payer: Self-pay | Admitting: Internal Medicine

## 2017-08-12 ENCOUNTER — Telehealth: Payer: Self-pay | Admitting: *Deleted

## 2017-08-12 VITALS — BP 130/84 | HR 72 | Temp 97.8°F | Wt 209.0 lb

## 2017-08-12 DIAGNOSIS — J0100 Acute maxillary sinusitis, unspecified: Secondary | ICD-10-CM | POA: Diagnosis not present

## 2017-08-12 MED ORDER — CLINDAMYCIN HCL 300 MG PO CAPS
300.0000 mg | ORAL_CAPSULE | Freq: Three times a day (TID) | ORAL | 0 refills | Status: DC
Start: 2017-08-12 — End: 2018-09-17

## 2017-08-12 NOTE — Assessment & Plan Note (Addendum)
Doesn't seem to have same asthma type symptoms from last year---but recommended she stay on the qvar for now Rx for clinda to have due to her being sick for 3 weeks--she will start if she gets worse

## 2017-08-12 NOTE — Progress Notes (Signed)
Subjective:    Patient ID: Yvonne Stephenson, female    DOB: 1966-03-20, 51 y.o.   MRN: [id number]  HPI Here due to respiratory infection  Cold and congestion started around 3 weeks ago Seemed to be getting better--but never went away Now with sinus congestion Feels it right in throat and bronchial  Slight SOB--like on stairs No fever No sputum Some wheezing--just restarted the qvar 2 days ago Has used the nebulizer some---not great relief  Some sore throat--gone for awhile Left ear pain  Tylenol and vicks rub--?some help  Current Outpatient Prescriptions on File Prior to Visit  Medication Sig Dispense Refill  . bisoprolol (ZEBETA) 5 MG tablet TAKE 1 TABLET BY MOUTH EVERY DAY 90 tablet 0  . omeprazole (PRILOSEC) 20 MG capsule Take 10 mg by mouth daily.  5  . PROAIR HFA 108 (90 Base) MCG/ACT inhaler INHALE 2 PUFFS INTO THE LUNGS 3 (THREE) TIMES DAILY AS NEEDED FOR WHEEZING OR SHORTNESS OF BREATH. 8.5 Inhaler 5  . sucralfate (CARAFATE) 1 GM/10ML suspension Take 10 mLs (1 g total) by mouth 2 (two) times daily. 1680 mL 2   No current facility-administered medications on file prior to visit.     Allergies  Allergen Reactions  . Penicillins Shortness Of Breath    REACTION: difficulty breathing  . Amoxicillin-Pot Clavulanate Swelling    Tongue swelling  . Atorvastatin     REACTION: hip pain  . Cefprozil     REACTION: rash  . Demerol Nausea And Vomiting  . Dilaudid [Hydromorphone Hcl] Nausea And Vomiting  . Doxycycline Other (See Comments)    Severe Abdominal Pain  . Meloxicam [Meloxicam] Swelling    Face swelling and lip tingling  . Zithromax [Azithromycin]     REACTION: tongue felt funny    Past Medical History:  Diagnosis Date  . Anxiety   . Asthma    only when gets a cold-expired inhaler  . GERD (gastroesophageal reflux disease)   . Hyperlipidemia   . Hypertension   . PONV (postoperative nausea and vomiting)     Past Surgical History:  Procedure Laterality  Date  . ABDOMINAL HYSTERECTOMY  07-2006  . BREAST SURGERY  2004   LUMPECTOMY  . CARPAL TUNNEL RELEASE  4-10   LEFT (Dr. Tommie Raymond)  . CESAREAN SECTION     3  . LAPAROSCOPY  04/09/2012   Procedure: LAPAROSCOPY OPERATIVE;  Surgeon: Donnamae Jude, MD;  Location: [facility];  Service: Gynecology;  Laterality: N/A;  . NASAL STENOSIS REPAIR  2003   . SALPINGOOPHORECTOMY  04/09/2012   Procedure: SALPINGO OOPHERECTOMY;  Surgeon: Donnamae Jude, MD;  Location: [facility];  Service: Gynecology;  Laterality: Right;  . TUBAL LIGATION      Family History  Problem Relation Age of Onset  . Heart attack Mother   . Heart disease Mother   . Hypertension Mother   . Stroke Mother   . Lung cancer Mother        smoked  . Allergies Mother   . Heart attack Father   . Heart disease Father   . Hypertension Father   . Diabetes Maternal Uncle   . Cancer Maternal Grandmother   . Heart failure Maternal Grandfather   . Heart failure Paternal Grandmother     Social History   Social History  . Marital status: Married    Spouse name: N/A  . Number of children: 3  . Years of education: N/A   Occupational History  . 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      Social History Main Topics  . Smoking status: Never Smoker  . Smokeless tobacco: Never Used  . Alcohol use Yes     Comment: occasionaly  . Drug use: No  . Sexual activity: No   Other Topics Concern  . Not on file   Social History Narrative   Does walk a little for exercise   Review of Systems  No rash No vomiting or diarrhea Some heartburn about 2 weeks ago--related to spicy food Appetite is okay     Objective:   Physical Exam  Constitutional: No distress.  HENT:  Mouth/Throat: Oropharynx is clear and moist. No oropharyngeal exudate.  Slight medial maxillary tenderness Mild nasal congestion TMs normal  Neck: No thyromegaly present.  Pulmonary/Chest: Effort normal and breath sounds normal. No respiratory  distress. She has no wheezes. She has no rales.  Lymphadenopathy:    She has no cervical adenopathy.          Assessment & Plan:

## 2017-08-12 NOTE — Telephone Encounter (Signed)
Pharmacist left a voicemail stating that they got a script sent in for Qvar 40 and that is no longer available. Pharmacist requested that a new script be sent in for Qvar Redi-inhaler.

## 2017-08-13 NOTE — Telephone Encounter (Signed)
Spoke to [place] at [facility] and confirmed ok to change inhaler

## 2017-08-13 NOTE — Telephone Encounter (Signed)
Okay to change to the redihaler---same dose

## 2017-08-25 ENCOUNTER — Ambulatory Visit (INDEPENDENT_AMBULATORY_CARE_PROVIDER_SITE_OTHER): Payer: Managed Care, Other (non HMO) | Admitting: Podiatry

## 2017-08-25 ENCOUNTER — Ambulatory Visit (INDEPENDENT_AMBULATORY_CARE_PROVIDER_SITE_OTHER): Payer: Managed Care, Other (non HMO)

## 2017-08-25 ENCOUNTER — Encounter: Payer: Self-pay | Admitting: Podiatry

## 2017-08-25 DIAGNOSIS — M778 Other enthesopathies, not elsewhere classified: Secondary | ICD-10-CM

## 2017-08-25 DIAGNOSIS — M779 Enthesopathy, unspecified: Secondary | ICD-10-CM

## 2017-08-25 DIAGNOSIS — Q828 Other specified congenital malformations of skin: Secondary | ICD-10-CM

## 2017-08-25 DIAGNOSIS — M2041 Other hammer toe(s) (acquired), right foot: Secondary | ICD-10-CM

## 2017-08-25 DIAGNOSIS — M7751 Other enthesopathy of right foot: Secondary | ICD-10-CM

## 2017-08-25 NOTE — Progress Notes (Signed)
She presents a chief complaint of a painful area to the dorsal aspect of the fifth toe right foot. She states some things going on with this toe is really getting any fit lately.  Objective: Vital signs are stable she is alert and oriented 3 pulses are palpable. She has a small nodular mass in the dorsal aspect of the fifth metatarsal on the right foot. This is painful on palpation with the adjacent nerve. She also has an overlying reactive hyperkeratosis at the area of the PIPJ fifth digit right foot.  Radiographs do not demonstrate any type of major osseous abnormalities and area of the history of arthroplasty.  Assessment: Capsulitis reactive hyperkeratosis.  Plan: Injected the areas noted excellent some local anesthetic. Also debrided the reactive hyperkeratosis and gave her instructions on how to stretch the shoes. She understands that was amenable to a follow-up with me as needed.

## 2017-10-17 ENCOUNTER — Other Ambulatory Visit: Payer: Self-pay | Admitting: Internal Medicine

## 2017-10-20 ENCOUNTER — Encounter: Payer: Managed Care, Other (non HMO) | Admitting: Internal Medicine

## 2017-10-27 ENCOUNTER — Telehealth: Payer: Self-pay | Admitting: Internal Medicine

## 2017-10-27 ENCOUNTER — Other Ambulatory Visit: Payer: Self-pay | Admitting: *Deleted

## 2017-10-27 MED ORDER — BISOPROLOL FUMARATE 5 MG PO TABS: 5.0000 mg | ORAL_TABLET | Freq: Every day | ORAL | 0 refills | Status: DC

## 2017-10-27 NOTE — Telephone Encounter (Signed)
Rx sent to [facility], patient advised.

## 2017-10-27 NOTE — Telephone Encounter (Signed)
Unclear what the issue is - plz call pt. zebeta was refilled #90 10/17/2017. Is there a shortage at [facility]? If so may recommend sending to different pharmacy - [facility].

## 2017-10-27 NOTE — Telephone Encounter (Signed)
Copied from [place] [id number]. Topic: Quick Communication - Rx Refill/Question >> Oct 27, 2017  8:44 AM Synthia Innocent wrote: Has the patient contacted their pharmacy? Yes.     (Agent: If no, request that the patient contact the pharmacy for the refill.)   Preferred Pharm[facility] (with phone number [facility] street [facility] [place] Please be advised that RX refills may take up to 3 business days. We ask that you follow-up with your pharmacy. Requesting refill on bisoprolol (ZEBETA) 5 MG tablet, pharmacy will not have till the middle of Feb, Please advise

## 2017-11-15 ENCOUNTER — Other Ambulatory Visit: Payer: Self-pay | Admitting: Internal Medicine

## 2018-01-15 ENCOUNTER — Encounter: Payer: Self-pay | Admitting: Internal Medicine

## 2018-01-16 MED ORDER — LORAZEPAM 0.5 MG PO TABS: 0.5000 mg | ORAL_TABLET | Freq: Two times a day (BID) | ORAL | 0 refills | Status: DC | PRN

## 2018-01-20 ENCOUNTER — Other Ambulatory Visit: Payer: Self-pay | Admitting: Internal Medicine

## 2018-01-20 DIAGNOSIS — Z1231 Encounter for screening mammogram for malignant neoplasm of breast: Secondary | ICD-10-CM

## 2018-01-22 ENCOUNTER — Other Ambulatory Visit: Payer: Self-pay | Admitting: Internal Medicine

## 2018-01-22 NOTE — Telephone Encounter (Signed)
Medication:omeprazole (PRILOSEC) 20 MG capsule Contacted Pharmacy : Yes Pharmacy:  [id number] [place], [address]  [place] [address]  Phone: [phone] Fax: [phone]  Notified that refills take up to 3 business days to be fulfilled

## 2018-01-23 ENCOUNTER — Ambulatory Visit
Admission: RE | Admit: 2018-01-23 | Discharge: 2018-01-23 | Disposition: A | Discharge status: Home / Self Care | Payer: Managed Care, Other (non HMO) | Source: Ambulatory Visit | Attending: Internal Medicine | Admitting: Internal Medicine

## 2018-01-23 DIAGNOSIS — Z1231 Encounter for screening mammogram for malignant neoplasm of breast: Secondary | ICD-10-CM

## 2018-03-06 ENCOUNTER — Other Ambulatory Visit: Payer: Self-pay | Admitting: Internal Medicine

## 2018-05-14 ENCOUNTER — Ambulatory Visit (INDEPENDENT_AMBULATORY_CARE_PROVIDER_SITE_OTHER): Payer: Managed Care, Other (non HMO)

## 2018-05-14 ENCOUNTER — Ambulatory Visit: Payer: Managed Care, Other (non HMO) | Admitting: Podiatry

## 2018-05-14 ENCOUNTER — Encounter: Payer: Self-pay | Admitting: Podiatry

## 2018-05-14 DIAGNOSIS — M7751 Other enthesopathy of right foot: Secondary | ICD-10-CM | POA: Diagnosis not present

## 2018-05-14 DIAGNOSIS — M779 Enthesopathy, unspecified: Secondary | ICD-10-CM | POA: Diagnosis not present

## 2018-05-14 DIAGNOSIS — M778 Other enthesopathies, not elsewhere classified: Secondary | ICD-10-CM

## 2018-05-14 DIAGNOSIS — D3610 Benign neoplasm of peripheral nerves and autonomic nervous system, unspecified: Secondary | ICD-10-CM | POA: Diagnosis not present

## 2018-05-14 NOTE — Progress Notes (Signed)
This patient presents the office with chief complaint of a painful right forefoot.  Patient states that the pain and swelling has diminished from when she initially developed her pain.  She says she wouldn't have severe cramping in the third and fourth toes of the right foot.  She denies any history of trauma or injury to the foot.  She denies having provided any self treatment nor sought any professional help.  She presents the office today for an evaluation and to see if there is any pathology in her right forefoot.  General Appearance  Alert, conversant and in no acute stress.  Vascular  Dorsalis pedis and posterior tibial  pulses are palpable  bilaterally.  Capillary return is within normal limits  bilaterally. Temperature is within normal limits  bilaterally.  Neurologic  Senn-Weinstein monofilament wire test within normal limits  bilaterally. Muscle power within normal limits bilaterally.  Nails normal nails noted with no evidence of any bacterial or fungal infection  Orthopedic  No limitations of motion of motion feet .  No crepitus or effusions noted. 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 Palpable pain in the 3rd interspace right foot.  Skin  normotropic skin with no porokeratosis noted bilaterally.  No signs of infections or ulcers noted.    Capsulitis right foot vs.  Neuroma 3rd interspace right foot.   IE  Xrays reveal no bony pathology with the head of proximal phalanx 5th toe right.  Told this patient that she appears to be improving from when she first described her pain.  I recommended she purchase OTC insoles and allow her right foot to declare itself. RTC prn.   Gardiner Barefoot DPM

## 2018-06-09 ENCOUNTER — Other Ambulatory Visit: Payer: Self-pay | Admitting: Internal Medicine

## 2018-07-27 ENCOUNTER — Other Ambulatory Visit: Payer: Self-pay | Admitting: Internal Medicine

## 2018-08-17 ENCOUNTER — Other Ambulatory Visit: Payer: Self-pay

## 2018-08-17 MED ORDER — BISOPROLOL FUMARATE 5 MG PO TABS: 5.0000 mg | ORAL_TABLET | Freq: Every day | ORAL | 0 refills | Status: DC

## 2018-08-17 MED ORDER — OMEPRAZOLE 20 MG PO CPDR: 20.0000 mg | DELAYED_RELEASE_CAPSULE | Freq: Every day | ORAL | 0 refills | Status: DC

## 2018-09-17 ENCOUNTER — Emergency Department (HOSPITAL_COMMUNITY)
Admission: EM | Admit: 2018-09-17 | Discharge: 2018-09-17 | Disposition: A | Discharge status: Home / Self Care | Payer: Managed Care, Other (non HMO) | Attending: Emergency Medicine | Admitting: Emergency Medicine

## 2018-09-17 ENCOUNTER — Encounter (HOSPITAL_COMMUNITY): Payer: Self-pay | Admitting: Emergency Medicine

## 2018-09-17 ENCOUNTER — Emergency Department (HOSPITAL_COMMUNITY): Payer: Managed Care, Other (non HMO)

## 2018-09-17 ENCOUNTER — Telehealth: Payer: Self-pay | Admitting: *Deleted

## 2018-09-17 DIAGNOSIS — R0789 Other chest pain: Secondary | ICD-10-CM | POA: Diagnosis not present

## 2018-09-17 DIAGNOSIS — R071 Chest pain on breathing: Secondary | ICD-10-CM | POA: Insufficient documentation

## 2018-09-17 DIAGNOSIS — I10 Essential (primary) hypertension: Secondary | ICD-10-CM | POA: Diagnosis not present

## 2018-09-17 DIAGNOSIS — R0602 Shortness of breath: Secondary | ICD-10-CM | POA: Insufficient documentation

## 2018-09-17 DIAGNOSIS — Z79899 Other long term (current) drug therapy: Secondary | ICD-10-CM | POA: Insufficient documentation

## 2018-09-17 DIAGNOSIS — M549 Dorsalgia, unspecified: Secondary | ICD-10-CM | POA: Diagnosis present

## 2018-09-17 LAB — CBC
HCT: 44.4 % (ref 36.0–46.0)
Hemoglobin: 14 g/dL (ref 12.0–15.0)
MCH: 26.7 pg (ref 26.0–34.0)
MCHC: 31.5 g/dL (ref 30.0–36.0)
MCV: 84.7 fL (ref 80.0–100.0)
Platelets: 202 10*3/uL (ref 150–400)
RBC: 5.24 MIL/uL — ABNORMAL HIGH (ref 3.87–5.11)
RDW: 13 % (ref 11.5–15.5)
WBC: 17.1 10*3/uL — ABNORMAL HIGH (ref 4.0–10.5)
nRBC: 0 % (ref 0.0–0.2)

## 2018-09-17 LAB — I-STAT TROPONIN, ED: Troponin i, poc: 0 ng/mL (ref 0.00–0.08)

## 2018-09-17 LAB — BASIC METABOLIC PANEL
Anion gap: 8 (ref 5–15)
BUN: 11 mg/dL (ref 6–20)
CO2: 24 mmol/L (ref 22–32)
Calcium: 9.7 mg/dL (ref 8.9–10.3)
Chloride: 106 mmol/L (ref 98–111)
Creatinine, Ser: 0.62 mg/dL (ref 0.44–1.00)
GFR calc Af Amer: >60 mL/min (ref >60)
GFR calc non Af Amer: >60 mL/min (ref >60)
Glucose, Bld: 104 mg/dL — ABNORMAL HIGH (ref 70–99)
Potassium: 3.8 mmol/L (ref 3.5–5.1)
Sodium: 138 mmol/L (ref 135–145)

## 2018-09-17 LAB — D-DIMER, QUANTITATIVE: D-Dimer, Quant: 0.52 ug/mL-FEU — ABNORMAL HIGH (ref 0.00–0.50)

## 2018-09-17 MED ORDER — IOPAMIDOL (ISOVUE-370) INJECTION 76%
INTRAVENOUS | Status: AC
  Filled 2018-09-17: qty 100

## 2018-09-17 MED ORDER — KETOROLAC TROMETHAMINE 15 MG/ML IJ SOLN
15.0000 mg | Freq: Once | INTRAMUSCULAR | Status: AC
  Administered 2018-09-17: 15 mg via INTRAVENOUS
  Filled 2018-09-17: qty 1

## 2018-09-17 MED ORDER — IOPAMIDOL (ISOVUE-370) INJECTION 76%
100.0000 mL | Freq: Once | INTRAVENOUS | Status: AC | PRN
  Administered 2018-09-17: 80 mL via INTRAVENOUS

## 2018-09-17 MED ORDER — SODIUM CHLORIDE 0.9 % IV BOLUS
500.0000 mL | Freq: Once | INTRAVENOUS | Status: AC
  Administered 2018-09-17: 500 mL via INTRAVENOUS

## 2018-09-17 NOTE — ED Triage Notes (Signed)
Pt reports being sent by orthopedic doc for concern for blood clot, pt reports bilateral shoulder pain and upper back pain, shortness of breath since Friday, worse yesterday. Reports ortho sent her for blood clot work up. Ortho gave her some prednisone and a shot of pain meds, no improvement

## 2018-09-17 NOTE — Telephone Encounter (Signed)
Spoke to pt who states she was experiencing back pain on the right side since the weekend. She had been taking Motrin, which provided some relief but on Tuesday, the pain became unbearable and she proceeded to the after hours clinic at her orthopaedist. She was given a shot and Rx of prednisone, and a xray which was negative, and was advised to f/u with her PCP to r/o a blood clot. Advised pt that there are no openings available at [facility]; pt states she will proceed to [facility]

## 2018-09-17 NOTE — Discharge Instructions (Signed)
Please take Tylenol or Ibuprofen for pain Try a heating pad on the area Please follow up with your doctor Return if you are worsening

## 2018-09-17 NOTE — ED Provider Notes (Signed)
[facility] EMERGENCY DEPARTMENT Provider Note   CSN: [id number] Arrival date & time: 09/17/18  9371     History   Chief Complaint Chief Complaint  Patient presents with  . Shoulder Pain  . Shortness of Breath    HPI Yvonne Stephenson is a 52 y.o. female who presents with back pain. 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 Past surgical hx significant for C-section, hysterectomy.  Patient states that last Saturday she sneezed very hard and then began to have right sided rib pain. The pain is worse with breathing and palpation. On Tuesday the pain worsened so she went to [facility] urgent care.  She had x-rays of her cervical spine and her right shoulder which were negative.  She was given a "shot" which took the edge off and course of steroids.  She states that this has not helped.  She was told by the provider that she should follow-up with her primary doctor to rule out a blood clot if she was not improving.  She reports shortness of breath and worsening pain with deep inspiration.  She denies fever, lightheadedness, syncope, cough, diaphoresis, nausea or vomiting, leg swelling. No recent surgery/travel/immobilization, hx of cancer, leg swelling, hemoptysis, prior DVT/PE, or hormone use.   HPI  Past Medical History:  Diagnosis Date  . Anxiety   . Asthma    only when gets a cold-expired inhaler  . GERD (gastroesophageal reflux disease)   . Hyperlipidemia   . Hypertension   . PONV (postoperative nausea and vomiting)     Patient Active Problem List   Diagnosis Date Noted  . ` 04/18/2017  . Cold sore 04/18/2017  . Abnormal mammogram of left breast 11/27/2016  . Cough variant asthma vs UACS  08/26/2016  . Asthma with acute exacerbation 08/12/2016  . Bowel habit changes 10/26/2015  . Acute non-recurrent maxillary sinusitis 03/15/2015  . Fatty infiltration of liver 01/14/2012  . Anxiety attack 11/01/2011  .  Hypertriglyceridemia 09/10/2007  . ANXIETY 09/10/2007  . HYPERTENSION 09/10/2007  . GERD 09/10/2007    Past Surgical History:  Procedure Laterality Date  . ABDOMINAL HYSTERECTOMY  07-2006  . BREAST BIOPSY Left   . BREAST EXCISIONAL BIOPSY Right   . BREAST SURGERY  2004   LUMPECTOMY  . CARPAL TUNNEL RELEASE  4-10   LEFT (Dr. Tommie Raymond)  . CESAREAN SECTION     3  . LAPAROSCOPY  04/09/2012   Procedure: LAPAROSCOPY OPERATIVE;  Surgeon: Donnamae Jude, MD;  Location: [facility];  Service: Gynecology;  Laterality: N/A;  . NASAL STENOSIS REPAIR  2003   . SALPINGOOPHORECTOMY  04/09/2012   Procedure: SALPINGO OOPHERECTOMY;  Surgeon: Donnamae Jude, MD;  Location: [facility];  Service: Gynecology;  Laterality: Right;  . TUBAL LIGATION       OB History    Gravida  4   Para  3   Term      Preterm      AB  1   Living  3     SAB  1   TAB      Ectopic      Multiple      Live Births               Home Medications    Prior to Admission medications   Medication Sig Start Date End Date Taking? Authorizing Provider  beclomethasone (QVAR) 40 MCG/ACT inhaler Inhale 2 puffs into the lungs 2 (two)  times daily as needed.    [provider]  bisoprolol (ZEBETA) 5 MG tablet Take 1 tablet (5 mg total) by mouth daily. 10/27/17   Ria Bush, MD  bisoprolol (ZEBETA) 5 MG tablet Take 1 tablet (5 mg total) by mouth daily. NEEDS OFFICE VISIT 08/17/18   Venia Carbon, MD  CARAFATE 1 GM/10ML suspension TAKE 10 MLS (1 G TOTAL) BY MOUTH 2 (TWO) TIMES DAILY. 11/17/17   Venia Carbon, MD  clindamycin (CLEOCIN) 300 MG capsule Take 1 capsule (300 mg total) by mouth 3 (three) times daily. 08/12/17   Venia Carbon, MD  LORazepam (ATIVAN) 0.5 MG tablet Take 1 tablet (0.5 mg total) by mouth 2 (two) times daily as needed for anxiety. 01/16/18   Venia Carbon, MD  omeprazole (PRILOSEC) 20 MG capsule Take 1 capsule (20 mg total) by mouth daily. 08/17/18   Venia Carbon, MD    PROAIR HFA 108 [phone] Base) MCG/ACT inhaler INHALE 2 PUFFS INTO THE LUNGS 3 (THREE) TIMES DAILY AS NEEDED FOR WHEEZING OR SHORTNESS OF BREATH. 03/24/17   Venia Carbon, MD  QVAR 40 MCG/ACT inhaler INHALE 2 PUFFS INTO THE LUNGS 2 (TWO) TIMES DAILY. 08/12/17   Venia Carbon, MD    Family History Family History  Problem Relation Age of Onset  . Heart attack Mother   . Heart disease Mother   . Hypertension Mother   . Stroke Mother   . Lung cancer Mother        smoked  . Allergies Mother   . Heart attack Father   . Heart disease Father   . Hypertension Father   . Diabetes Maternal Uncle   . Cancer Maternal Grandmother   . Heart failure Maternal Grandfather   . Heart failure Paternal Grandmother     Social History Social History   Tobacco Use  . Smoking status: Never Smoker  . Smokeless tobacco: Never Used  Substance Use Topics  . Alcohol use: Yes    Comment: occasionaly  . Drug use: No     Allergies   Penicillins; Amoxicillin-pot clavulanate; Atorvastatin; Cefprozil; Demerol; Dilaudid [hydromorphone hcl]; Doxycycline; Meloxicam [meloxicam]; and Zithromax [azithromycin]   Review of Systems Review of Systems  Constitutional: Negative for chills, diaphoresis and fever.  Respiratory: Positive for shortness of breath. Negative for cough and wheezing.   Cardiovascular: Positive for chest pain. Negative for palpitations and leg swelling.  Gastrointestinal: Negative for abdominal pain, nausea and vomiting.  Neurological: Negative for syncope and light-headedness.  All other systems reviewed and are negative.    Physical Exam Updated Vital Signs BP (!) 165/90 (BP Location: Right Arm)   Pulse 79   Temp 98.5 F (36.9 C) (Oral)   Resp 18   Ht 5\' 1"  (1.549 m)   Wt 79.4 kg   SpO2 99%   BMI 33.07 kg/m   Physical Exam  Constitutional: She is oriented to person, place, and time. She appears well-developed and well-nourished. No distress.  Calm and cooperative  HENT:   Head: Normocephalic and atraumatic.  Eyes: Pupils are equal, round, and reactive to light. Conjunctivae are normal. Right eye exhibits no discharge. Left eye exhibits no discharge. No scleral icterus.  Neck: Normal range of motion.  Cardiovascular: Normal rate and regular rhythm.  Pulmonary/Chest: Effort normal and breath sounds normal. No respiratory distress. She exhibits tenderness (right lower lateral rib tenderness).  Abdominal: Soft. Bowel sounds are normal. She exhibits no distension. There is no tenderness.  Musculoskeletal:  No  calf tenderness or edema  Neurological: She is alert and oriented to person, place, and time.  Skin: Skin is warm and dry.  Psychiatric: She has a normal mood and affect. Her behavior is normal.  Nursing note and vitals reviewed.    ED Treatments / Results  Labs (all labs ordered are listed, but only abnormal results are displayed) Labs Reviewed  BASIC METABOLIC PANEL - Abnormal; Notable for the following components:      Result Value   Glucose, Bld 104 (*)    All other components within normal limits  CBC - Abnormal; Notable for the following components:   WBC 17.1 (*)    RBC 5.24 (*)    All other components within normal limits  D-DIMER, QUANTITATIVE (NOT AT [facility]) - Abnormal; Notable for the following components:   D-Dimer, Quant 0.52 (*)    All other components within normal limits  I-STAT TROPONIN, ED    EKG None  Radiology Ct Angio Chest Pe W/cm &/or Wo Cm  Result Date: 09/17/2018 CLINICAL DATA:  Chest pain, positive D-dimer EXAM: CT ANGIOGRAPHY CHEST WITH CONTRAST TECHNIQUE: Multidetector CT imaging of the chest was performed using the standard protocol during bolus administration of intravenous contrast. Multiplanar CT image reconstructions and MIPs were obtained to evaluate the vascular anatomy. CONTRAST:  16mL ISOVUE-370 IOPAMIDOL (ISOVUE-370) INJECTION 76% COMPARISON:  Chest x-ray 08/17/2016. FINDINGS: Cardiovascular: No filling  defects in the pulmonary arteries to suggest pulmonary emboli. Heart is normal size. Aorta is normal caliber. Mediastinum/Nodes: No mediastinal, hilar, or axillary adenopathy. Lungs/Pleura: Lungs are clear. No focal airspace opacities or suspicious nodules. No effusions. Upper Abdomen: Suspect fatty infiltration of the liver. No acute findings. Musculoskeletal: Chest wall soft tissues are unremarkable. No acute bony abnormality. Review of the MIP images confirms the above findings. IMPRESSION: No evidence of pulmonary embolus. No acute cardiopulmonary disease. Suspect fatty infiltration of the liver. Electronically Signed   By: Rolm Baptise M.D.   On: 09/17/2018 12:10    Procedures Procedures (including critical care time)  Medications Ordered in ED Medications  iopamidol (ISOVUE-370) 76 % injection (has no administration in time range)  ketorolac (TORADOL) 15 MG/ML injection 15 mg (15 mg Intravenous Given 09/17/18 0923)  sodium chloride 0.9 % bolus 500 mL (0 mLs Intravenous Stopped 09/17/18 1254)  iopamidol (ISOVUE-370) 76 % injection 100 mL (80 mLs Intravenous Contrast Given 09/17/18 1206)     Initial Impression / Assessment and Plan / ED Course  I have reviewed the triage vital signs and the nursing notes.  Pertinent labs & imaging results that were available during my care of the patient were reviewed by me and considered in my medical decision making (see chart for details).  52 year old female presents with right rib pain which is worsened with inspiration and shortness of breath.  She is mildly hypertensive otherwise vital signs are normal.  She is calm and cooperative and in no acute distress.  On exam her pain is reproducible with palpation of the right lateral chest wall.  EKG is normal sinus rhythm.  CBC shows leukocytosis of 17.1.  This is likely due to her recent steroid use.  BMP is normal.  Initial point-of-care troponin is negative.  Her d-dimer was slightly elevated therefore CTA  of the chest was obtained to rule out PE.  She was given Toradol for pain control.  CT is negative for PE.  She reports improved pain with Toradol.  She was given reassurance regarding her symptoms that it is likely due  to muscle strain.  She was encouraged to take over-the-counter medicines for pain and to use a heating pad and follow-up with her doctor.  Final Clinical Impressions(s) / ED Diagnoses   Final diagnoses:  Right-sided chest wall pain    ED Discharge Orders    None       Recardo Evangelist, PA-C 09/17/18 1340    Charlesetta Shanks, MD 09/19/18 [phone]

## 2018-09-17 NOTE — Telephone Encounter (Signed)
Spoke to pt and advised 

## 2018-09-17 NOTE — Telephone Encounter (Signed)
Assuming they are referring to a PE (since no mention of her legs), she would have to go to the ER for CT scan or similar

## 2018-09-17 NOTE — ED Notes (Signed)
Patient transported to CT 

## 2018-09-17 NOTE — ED Notes (Signed)
Patient left at this time with all belongings. 

## 2018-09-22 ENCOUNTER — Telehealth: Payer: Self-pay

## 2018-09-22 NOTE — Telephone Encounter (Signed)
Pt said she is feeling much better. Believes it was musculoskeletal pain.

## 2018-10-07 ENCOUNTER — Other Ambulatory Visit: Payer: Self-pay | Admitting: Physical Medicine and Rehabilitation

## 2018-10-07 DIAGNOSIS — R200 Anesthesia of skin: Secondary | ICD-10-CM

## 2018-10-07 DIAGNOSIS — M542 Cervicalgia: Secondary | ICD-10-CM

## 2018-10-09 ENCOUNTER — Other Ambulatory Visit: Payer: Self-pay | Admitting: Internal Medicine

## 2018-10-12 NOTE — Telephone Encounter (Signed)
Please call patient and schedule an appointment for medication refill, then send back for refill.

## 2018-10-12 NOTE — Telephone Encounter (Signed)
Patient scheduled cpx on 10/23/18.

## 2018-10-13 MED ORDER — BISOPROLOL FUMARATE 5 MG PO TABS: 5.0000 mg | ORAL_TABLET | Freq: Every day | ORAL | 0 refills | Status: DC

## 2018-10-13 NOTE — Addendum Note (Signed)
Addended by: Pilar Grammes on: 10/13/2018 08:33 AM   Modules accepted: Orders

## 2018-10-17 ENCOUNTER — Telehealth: Payer: Managed Care, Other (non HMO) | Admitting: Physician Assistant

## 2018-10-17 ENCOUNTER — Ambulatory Visit
Admission: RE | Admit: 2018-10-17 | Discharge: 2018-10-17 | Disposition: A | Discharge status: Home / Self Care | Payer: Managed Care, Other (non HMO) | Source: Ambulatory Visit | Attending: Physical Medicine and Rehabilitation | Admitting: Physical Medicine and Rehabilitation

## 2018-10-17 DIAGNOSIS — M542 Cervicalgia: Secondary | ICD-10-CM

## 2018-10-17 DIAGNOSIS — J029 Acute pharyngitis, unspecified: Secondary | ICD-10-CM

## 2018-10-17 DIAGNOSIS — R200 Anesthesia of skin: Secondary | ICD-10-CM

## 2018-10-17 NOTE — Progress Notes (Signed)

## 2018-10-19 NOTE — Telephone Encounter (Signed)
[facility] faxed note on 10/17/18 pt having S/T,sinus h/a. No fever. I spoke with pt and she was seen over weekend and given abx. Pt said she feels much better today. FYI to Dr Silvio Pate.

## 2018-10-23 ENCOUNTER — Encounter: Payer: Managed Care, Other (non HMO) | Admitting: Internal Medicine

## 2018-11-14 ENCOUNTER — Other Ambulatory Visit: Payer: Self-pay | Admitting: Internal Medicine

## 2018-11-17 ENCOUNTER — Ambulatory Visit (INDEPENDENT_AMBULATORY_CARE_PROVIDER_SITE_OTHER): Payer: Managed Care, Other (non HMO) | Admitting: Internal Medicine

## 2018-11-17 ENCOUNTER — Encounter: Payer: Self-pay | Admitting: Internal Medicine

## 2018-11-17 ENCOUNTER — Telehealth: Payer: Self-pay | Admitting: Internal Medicine

## 2018-11-17 VITALS — BP 138/78 | HR 67 | Temp 98.0°F | Ht 60.0 in | Wt 189.0 lb

## 2018-11-17 DIAGNOSIS — I10 Essential (primary) hypertension: Secondary | ICD-10-CM

## 2018-11-17 DIAGNOSIS — Z23 Encounter for immunization: Secondary | ICD-10-CM | POA: Diagnosis not present

## 2018-11-17 DIAGNOSIS — Z0000 Encounter for general adult medical examination without abnormal findings: Secondary | ICD-10-CM

## 2018-11-17 DIAGNOSIS — J4520 Mild intermittent asthma, uncomplicated: Secondary | ICD-10-CM | POA: Diagnosis not present

## 2018-11-17 MED ORDER — BISOPROLOL FUMARATE 5 MG PO TABS: 5.0000 mg | ORAL_TABLET | Freq: Every day | ORAL | 3 refills | Status: DC

## 2018-11-17 MED ORDER — OMEPRAZOLE 20 MG PO CPDR: DELAYED_RELEASE_CAPSULE | ORAL | 3 refills | Status: DC

## 2018-11-17 NOTE — Assessment & Plan Note (Signed)
Healthy Discussed working fitness Prefers no flu vaccine Tdap today Colon due in June Mammogram every 1-2 years No pap due to hyster

## 2018-11-17 NOTE — Progress Notes (Signed)
Subjective:    Patient ID: Yvonne Stephenson, female    DOB: [birth year], 53 y.o.   MRN: [id number]  HPI Here for physical  Has been seeing Dr Ron Agee for her neck and back Put on celebrex--but causing nausea and abdominal pain She stopped this Is due for an injection soon  No breathing problems BP is good  Current Outpatient Medications on File Prior to Visit  Medication Sig Dispense Refill  . bisoprolol (ZEBETA) 5 MG tablet Take 1 tablet (5 mg total) by mouth daily. NEEDS OFFICE VISIT 30 tablet 0  . CARAFATE 1 GM/10ML suspension TAKE 10 MLS (1 G TOTAL) BY MOUTH 2 (TWO) TIMES DAILY. (Patient taking differently: Take 1 g by mouth every morning. ) 1680 mL 1  . LORazepam (ATIVAN) 0.5 MG tablet Take 1 tablet (0.5 mg total) by mouth 2 (two) times daily as needed for anxiety. 30 tablet 0  . Multiple Vitamins-Minerals (AIRBORNE PO) Take 1 tablet by mouth every Monday, Tuesday, Wednesday, Thursday, and Friday.    [name] omeprazole (PRILOSEC) 20 MG capsule TAKE 1 CAPSULE BY MOUTH EVERY DAY 90 capsule 0  . PROAIR HFA 108 (90 Base) MCG/ACT inhaler INHALE 2 PUFFS INTO THE LUNGS 3 (THREE) TIMES DAILY AS NEEDED FOR WHEEZING OR SHORTNESS OF BREATH. 8.5 Inhaler 5  . QVAR 40 MCG/ACT inhaler INHALE 2 PUFFS INTO THE LUNGS 2 (TWO) TIMES DAILY. 8.7 g 3   No current facility-administered medications on file prior to visit.     Allergies  Allergen Reactions  . Penicillins Shortness Of Breath    Has patient had a PCN reaction causing immediate rash, facial/tongue/throat swelling, SOB or lightheadedness with hypotension: Yes Has patient had a PCN reaction causing severe rash involving mucus membranes or skin necrosis: No Has patient had a PCN reaction that required hospitalization: No Has patient had a PCN reaction occurring within the last 10 years: No If all of the above answers are "NO", then may proceed with Cephalosporin use.   REACTION: difficulty breathing  . Shellfish Allergy Shortness Of Breath  .  Amoxicillin-Pot Clavulanate Swelling    Tongue swelling  . Atorvastatin     REACTION: hip pain  . Cefprozil     REACTION: rash  . Demerol Nausea And Vomiting  . Dilaudid [Hydromorphone Hcl] Nausea And Vomiting  . Doxycycline Other (See Comments)    Severe Abdominal Pain  . Meloxicam [Meloxicam] Swelling    Face swelling and lip tingling  . Zithromax [Azithromycin]     REACTION: tongue felt funny    Past Medical History:  Diagnosis Date  . Anxiety   . Asthma    only when gets a cold-expired inhaler  . GERD (gastroesophageal reflux disease)   . Hyperlipidemia   . Hypertension   . PONV (postoperative nausea and vomiting)     Past Surgical History:  Procedure Laterality Date  . ABDOMINAL HYSTERECTOMY  07-2006  . BREAST BIOPSY Left   . BREAST EXCISIONAL BIOPSY Right   . BREAST SURGERY  2004   LUMPECTOMY  . CARPAL TUNNEL RELEASE  4-10   LEFT (Dr. Tommie Raymond)  . CESAREAN SECTION     3  . LAPAROSCOPY  04/09/2012   Procedure: LAPAROSCOPY OPERATIVE;  Surgeon: Donnamae Jude, MD;  Location: [facility];  Service: Gynecology;  Laterality: N/A;  . NASAL STENOSIS REPAIR  2003   . SALPINGOOPHORECTOMY  04/09/2012   Procedure: SALPINGO OOPHERECTOMY;  Surgeon: Donnamae Jude, MD;  Location: [facility];  Service: Gynecology;  Laterality: Right;  .  TUBAL LIGATION      Family History  Problem Relation Age of Onset  . Heart attack Mother   . Heart disease Mother   . Hypertension Mother   . Stroke Mother   . Lung cancer Mother        smoked  . Allergies Mother   . Heart attack Father   . Heart disease Father   . Hypertension Father   . Diabetes Maternal Uncle   . Cancer Maternal Grandmother   . Heart failure Maternal Grandfather   . Heart failure Paternal Grandmother     Social History   Socioeconomic History  . Marital status: Married    Spouse name: Not on file  . Number of children: 3  . Years of education: Not on file  . Highest education level: Not on file  Occupational History    . Occupation: Optometrist    Comment: [employer], [employer] in [place]  . Occupation:    Social Needs  . Financial resource strain: Not on file  . Food insecurity:    Worry: Not on file    Inability: Not on file  . Transportation needs:    Medical: Not on file    Non-medical: Not on file  Tobacco Use  . Smoking status: Never Smoker  . Smokeless tobacco: Never Used  Substance and Sexual Activity  . Alcohol use: Yes    Comment: occasionaly  . Drug use: No  . Sexual activity: Never  Lifestyle  . Physical activity:    Days per week: Not on file    Minutes per session: Not on file  . Stress: Not on file  Relationships  . Social connections:    Talks on phone: Not on file    Gets together: Not on file    Attends religious service: Not on file    Active member of club or organization: Not on file    Attends meetings of clubs or organizations: Not on file    Relationship status: Not on file  . Intimate partner violence:    Fear of current or ex partner: Not on file    Emotionally abused: Not on file    Physically abused: Not on file    Forced sexual activity: Not on file  Other Topics Concern  . Not on file  Social History Narrative   Does walk a little for exercise   Review of Systems  Constitutional: Negative for fatigue.       Weight variable but down overall Wears seat belt  HENT: Negative for dental problem, hearing loss, tinnitus and trouble swallowing.        Slight right ear pain--gets fluid there Keeps up with dentist  Eyes: Negative for visual disturbance.       No diplopia or unilateral vision loss  Respiratory: Negative for cough, chest tightness, shortness of breath and wheezing.        Uses the q-var if she has infection Sensitive to some perfume  Cardiovascular: Negative for chest pain, palpitations and leg swelling.  Gastrointestinal: Negative for abdominal pain.       Increased stools due to celebrex Heartburn with the celebrex   Endocrine: Negative for polydipsia and polyuria.  Genitourinary: Negative for dyspareunia, dysuria and hematuria.  Musculoskeletal: Positive for neck pain. Negative for arthralgias and back pain.  Skin: Negative for rash.       No suspicious skin lesions  Allergic/Immunologic: Negative for environmental allergies and immunocompromised state.  Neurological: Positive for headaches.  Negative for dizziness, syncope and light-headedness.       Relates headaches to excessive heat in the school  Hematological: Negative for adenopathy. Does not bruise/bleed easily.  Psychiatric/Behavioral: Negative for dysphoric mood and sleep disturbance.       Objective:   Physical Exam  Constitutional: She is oriented to person, place, and time. She appears well-developed. No distress.  HENT:  Head: Normocephalic and atraumatic.  Right Ear: External ear normal.  Left Ear: External ear normal.  Mouth/Throat: Oropharynx is clear and moist. No oropharyngeal exudate.  Eyes: Pupils are equal, round, and reactive to light. Conjunctivae are normal.  Neck: No thyromegaly present.  Cardiovascular: Normal rate, regular rhythm, normal heart sounds and intact distal pulses. Exam reveals no gallop.  No murmur heard. Respiratory: Effort normal and breath sounds normal. No respiratory distress. She has no wheezes. She has no rales.  GI: Soft. There is no abdominal tenderness.  Musculoskeletal:        General: No tenderness or edema.  Lymphadenopathy:    She has no cervical adenopathy.  Neurological: She is alert and oriented to person, place, and time.  Skin: No rash noted. No erythema.  Psychiatric: She has a normal mood and affect. Her behavior is normal.           Assessment & Plan:

## 2018-11-17 NOTE — Assessment & Plan Note (Signed)
BP Readings from Last 3 Encounters:  11/17/18 138/78  09/17/18 (!) 135/57  08/12/17 130/84   Good control

## 2018-11-17 NOTE — Telephone Encounter (Signed)
Pt stated she wanted the Tamaflu because 5 student was out 3 tested positive for the flu and 1 went home sick. Please call pt.

## 2018-11-17 NOTE — Addendum Note (Signed)
Addended by: Pilar Grammes on: 11/17/2018 09:56 AM   Modules accepted: Orders

## 2018-11-17 NOTE — Assessment & Plan Note (Signed)
Doing well without regular meds

## 2018-11-18 MED ORDER — OSELTAMIVIR PHOSPHATE 75 MG PO CAPS: 75.0000 mg | ORAL_CAPSULE | Freq: Every day | ORAL | 0 refills | Status: DC

## 2018-11-18 NOTE — Telephone Encounter (Signed)
Rx sent to pharmacy. Spoke to pt. 

## 2018-11-18 NOTE — Telephone Encounter (Signed)
Okay to send tamiflu 75mg   #10 x 0 1 daily for prevention

## 2019-01-12 ENCOUNTER — Ambulatory Visit: Payer: Managed Care, Other (non HMO) | Admitting: Family Medicine

## 2019-01-12 ENCOUNTER — Other Ambulatory Visit: Payer: Self-pay

## 2019-01-12 DIAGNOSIS — R059 Cough, unspecified: Secondary | ICD-10-CM

## 2019-01-12 DIAGNOSIS — R05 Cough: Secondary | ICD-10-CM | POA: Diagnosis not present

## 2019-01-12 NOTE — Patient Instructions (Addendum)
Low risk for COVID19   Rest, fluids.  Change Tamiflu to twice daily until gone.  Mucinex DM as needed for cough. Stay on Qvar and use albuterol as needed.  Can use tylenol or ibuprofen for sore throat.  Call if severe shortness of breath or wheeze.

## 2019-01-12 NOTE — Progress Notes (Signed)
Subjective:    Patient ID: Yvonne Stephenson, female    DOB: 1966/04/25, 53 y.o.   MRN: [id number]  Cough  This is a new problem. The current episode started in the past 7 days (4 days, started following exposure to chemicals in air at [facility]). The cough is non-productive. Associated symptoms include ear pain, nasal congestion, rhinorrhea and a sore throat. Pertinent negatives include no fever, headaches, myalgias, shortness of breath or wheezing. Associated symptoms comments: Left ear  cough  fatigue. Nothing aggravates the symptoms. Risk factors: nonsmoker. She has tried a beta-agonist inhaler (motrin) for the symptoms. The treatment provided mild relief. Her past medical history is significant for asthma and environmental allergies. There is no history of COPD.  Sore Throat   Associated symptoms include coughing and ear pain. Pertinent negatives include no headaches or shortness of breath.   Using albuterol prn.. 2 times today  Started Qvar back in last few day   Flu A at work.   Used old tamiflu started on Sunday.. per Dr. Silvio Pate... buthas been taking preventative course.  Social History /Family History/Past Medical History reviewed in detail and updated in EMR if needed. Blood pressure 120/70, pulse 93, temperature 98.4 F (36.9 C), temperature source Oral, height 5' (1.524 m), weight 194 lb (88 kg), SpO2 97 %.  Review of Systems  Constitutional: Negative for fever.  HENT: Positive for ear pain, rhinorrhea and sore throat.   Respiratory: Positive for cough. Negative for shortness of breath and wheezing.   Musculoskeletal: Negative for myalgias.  Allergic/Immunologic: Positive for environmental allergies.  Neurological: Negative for headaches.       Objective:   Physical Exam Constitutional:      General: She is not in acute distress.    Appearance: She is well-developed. She is not ill-appearing or toxic-appearing.  HENT:     Head: Normocephalic.     Right Ear: Hearing,  tympanic membrane, ear canal and external ear normal. Tympanic membrane is not erythematous, retracted or bulging.     Left Ear: Hearing, tympanic membrane, ear canal and external ear normal. Tympanic membrane is not erythematous, retracted or bulging.     Nose: Mucosal edema and rhinorrhea present.     Right Sinus: No maxillary sinus tenderness or frontal sinus tenderness.     Left Sinus: No maxillary sinus tenderness or frontal sinus tenderness.     Mouth/Throat:     Pharynx: Uvula midline.  Eyes:     General: Lids are normal. Lids are everted, no foreign bodies appreciated.     Conjunctiva/sclera: Conjunctivae normal.     Pupils: Pupils are equal, round, and reactive to light.  Neck:     Musculoskeletal: Normal range of motion and neck supple.     Thyroid: No thyroid mass or thyromegaly.     Vascular: No carotid bruit.     Trachea: Trachea normal.  Cardiovascular:     Rate and Rhythm: Normal rate and regular rhythm.     Pulses: Normal pulses.     Heart sounds: Normal heart sounds, S1 normal and S2 normal. No murmur. No friction rub. No gallop.   Pulmonary:     Effort: Pulmonary effort is normal. No tachypnea or respiratory distress.     Breath sounds: Normal breath sounds. No decreased breath sounds, wheezing, rhonchi or rales.  Skin:    General: Skin is warm and dry.     Findings: No rash.  Neurological:     Mental Status: She is alert.  Psychiatric:  Mood and Affect: Mood is not anxious or depressed.        Speech: Speech normal.        Behavior: Behavior normal. Behavior is cooperative.        Judgment: Judgment normal.           Assessment & Plan:

## 2019-01-12 NOTE — Assessment & Plan Note (Signed)
Low risk for COVID19  Most likely viral infeciton.[name] already on tamiflu... change to treatment dosing. No clear asthma exacerbation, but at risk.. stay back on Qvar and use albuterol as needed.  Symptomatic care.

## 2019-01-14 ENCOUNTER — Telehealth: Payer: Self-pay | Admitting: Family Medicine

## 2019-01-14 MED ORDER — CLINDAMYCIN HCL 300 MG PO CAPS: 300.0000 mg | ORAL_CAPSULE | Freq: Three times a day (TID) | ORAL | 0 refills | Status: DC

## 2019-01-14 NOTE — Telephone Encounter (Signed)
Pt was seen 3.17.20 and stated she isn't feeling better and her sinus are still bothering her. Pt want an antibiotic called in to [facility]

## 2019-01-14 NOTE — Addendum Note (Signed)
Addended by: Eliezer Lofts E on: 01/14/2019 12:41 PM   Modules accepted: Orders

## 2019-01-14 NOTE — Telephone Encounter (Signed)
See my chart note.

## 2019-03-16 IMAGING — MR MR CERVICAL SPINE W/O CM
4 of 5 series · 24 of 48 positions shown · non-contrast
Comparison: MRI of the cervical spine May 17, 2015

CLINICAL DATA: Chronic neck pain after bending neck. Bilateral arm
and hand numbness.

EXAM:
MRI CERVICAL SPINE WITHOUT CONTRAST
TECHNIQUE: Multiplanar, multisequence MR imaging of the cervical spine was
performed. No intravenous contrast was administered.

[Series 3: T2 post-contrast · sagittal · 3.3mm · 0.37mm/px · 6 of 13 slices shown]
[im 1/13]
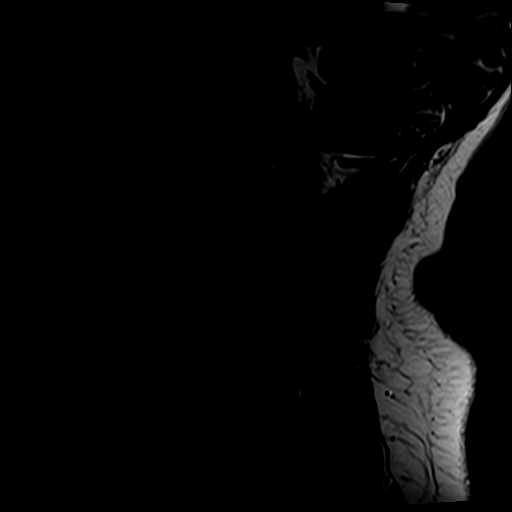
[im 3/13]
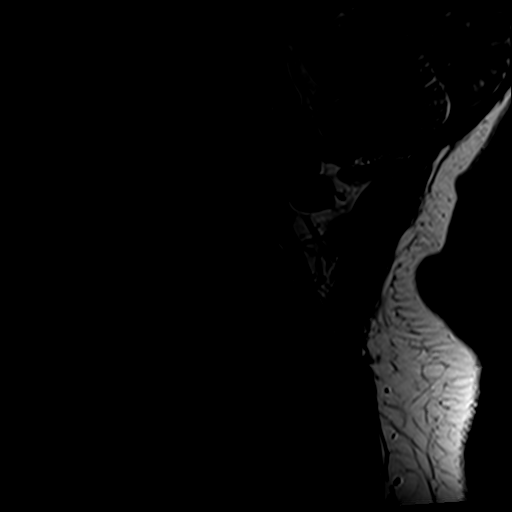
[im 5/13]
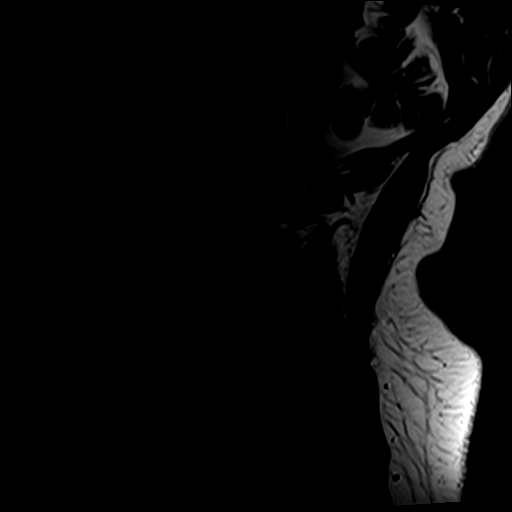
[im 8/13]
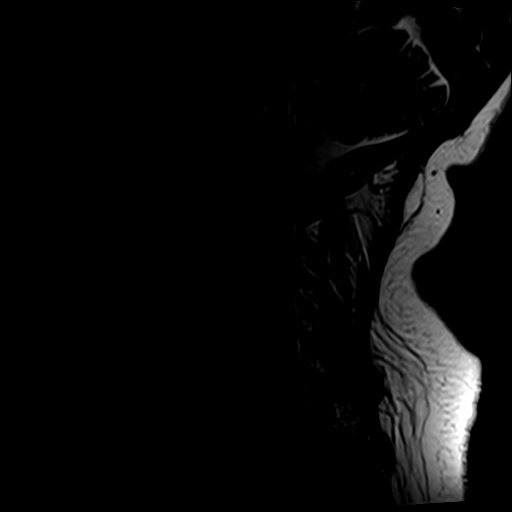
[im 10/13]
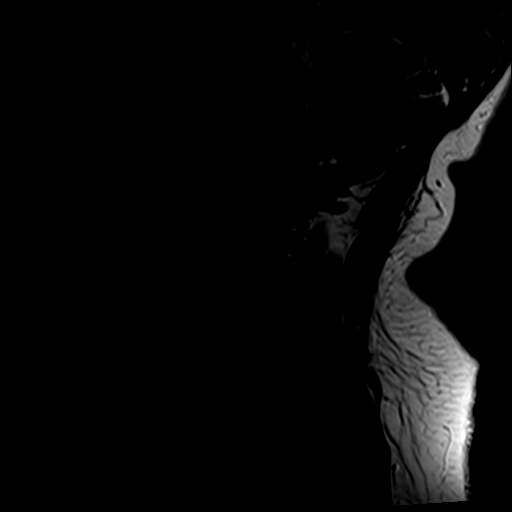
[im 13/13]
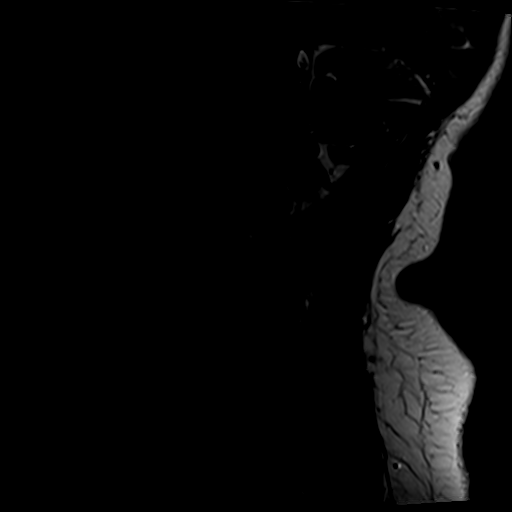

[Series 4: T1 · sagittal · 3.3mm · 0.37mm/px · 7 of 13 slices shown]
[im 1/13]
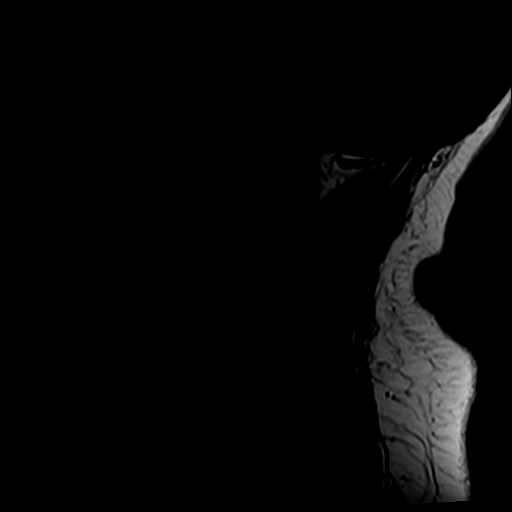
[im 3/13]
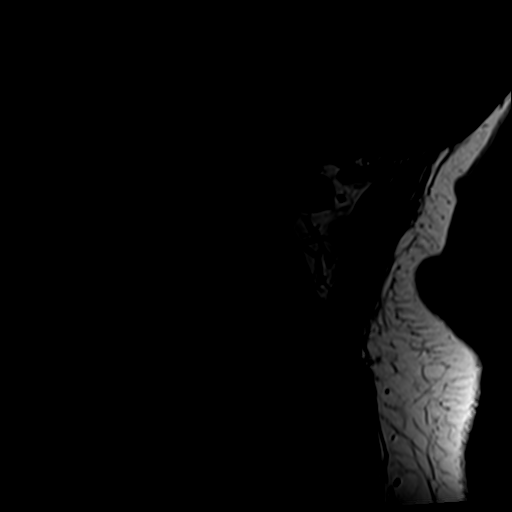
[im 5/13]
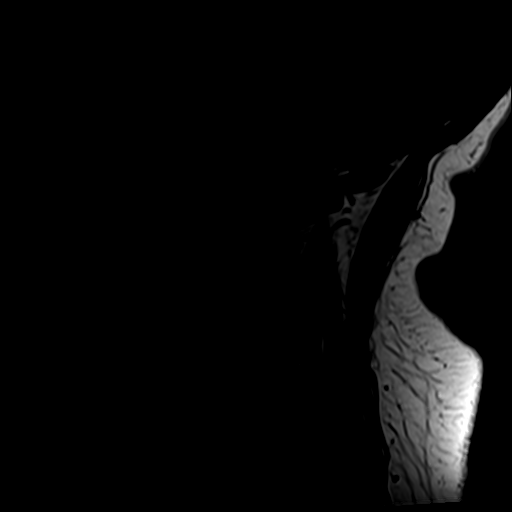
[im 7/13]
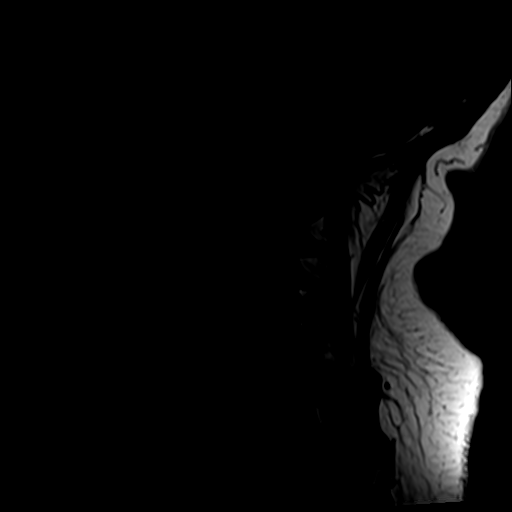
[im 9/13]
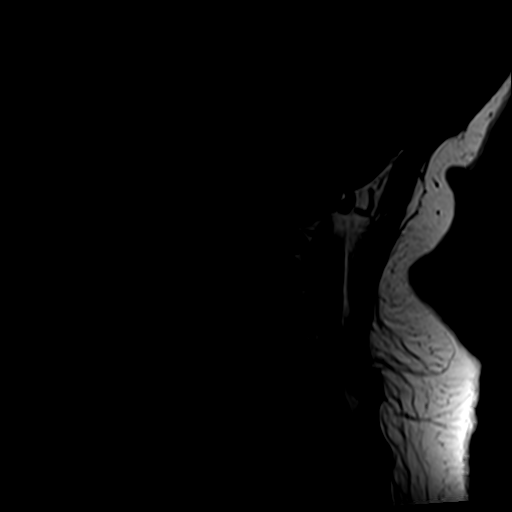
[im 11/13]
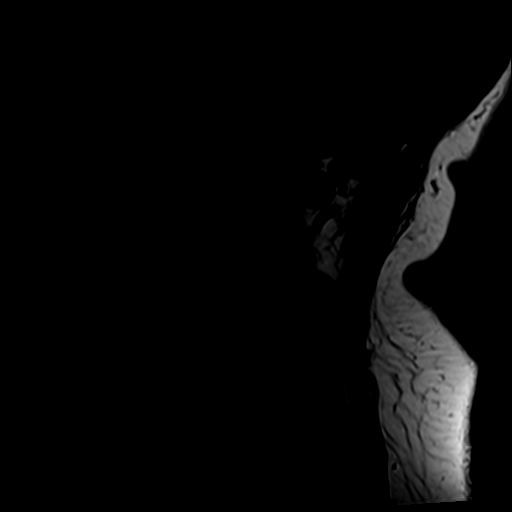
[im 13/13]
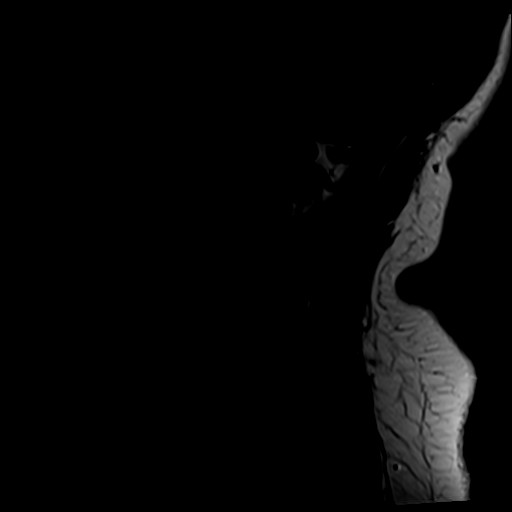

[Series 5: tir sag · sagittal · 3.3mm · 0.37mm/px · 3 of 13 slices shown]
[im 3/13]
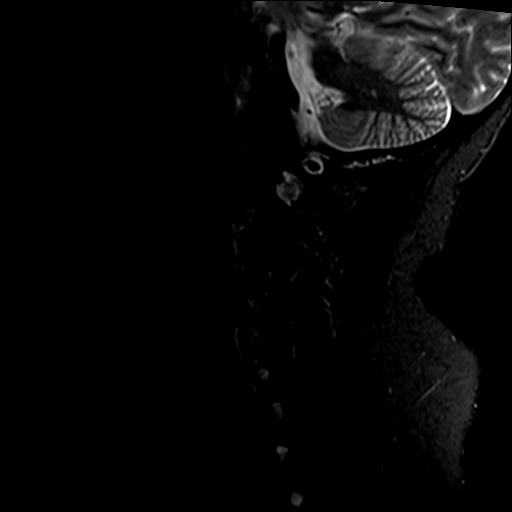
[im 7/13]
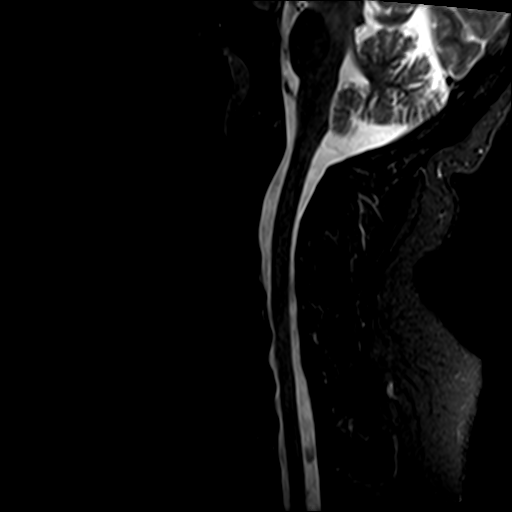
[im 11/13]
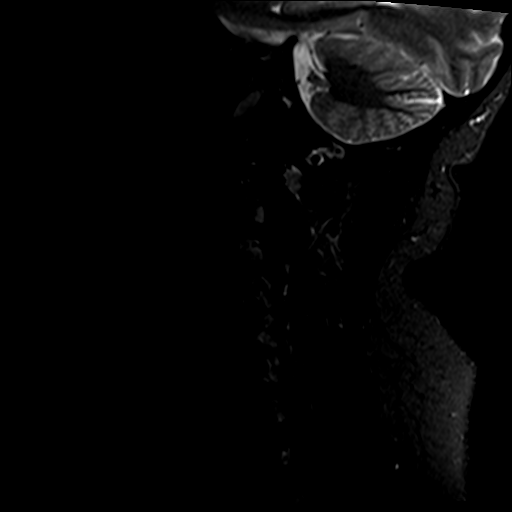

[Series 7: T2 · axial · 3.0mm · 0.70mm/px · z∈[-97,-5]mm · 8 of 26 slices shown]
[im 1/26]
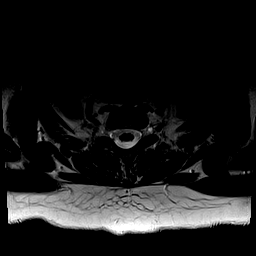
[im 4/26]
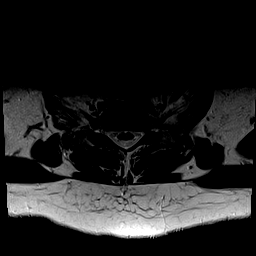
[im 8/26]
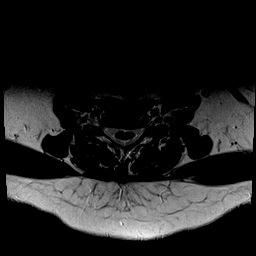
[im 12/26]
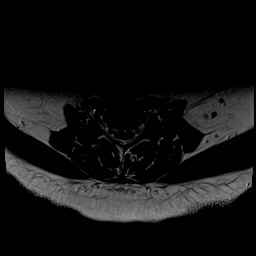
[im 14/26]
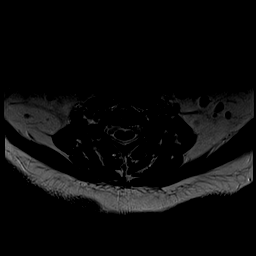
[im 18/26]
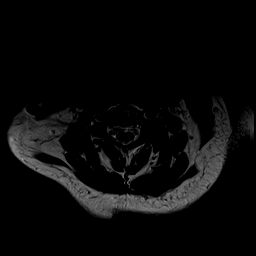
[im 22/26]
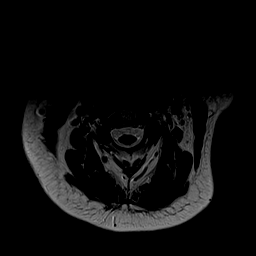
[im 26/26]
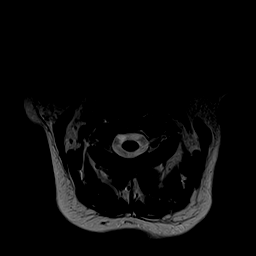

[24 of 48 positions shown; findings below may reference images not displayed]

FINDINGS: ALIGNMENT: Straightened cervical lordosis.  No malalignment.

VERTEBRAE/DISCS: Vertebral bodies are intact. Mild C4-5 through
C7-T1 disc height loss with disc desiccation. Multilevel mild
chronic discogenic endplate changes, acute T1 superior endplate
Schmorl's node versus acute discogenic endplate changes. Low T1 and
bright STIR signal LEFT C4-5 facets.

CORD:Cervical spinal cord is normal morphology and signal
characteristics from the cervicomedullary junction to level of T2-3,
the most caudal well visualized level.

POSTERIOR FOSSA, VERTEBRAL ARTERIES, PARASPINAL TISSUES: No MR
findings of ligamentous injury. Vertebral artery flow voids present.
Included posterior fossa and paraspinal soft tissues are normal.

DISC LEVELS:

C2-3: No disc bulge, canal stenosis nor neural foraminal narrowing.
Mild LEFT facet arthropathy.

C3-4: Small broad-based disc bulge, uncovertebral hypertrophy. Mild
RIGHT and severe LEFT facet arthropathy. No canal stenosis. Moderate
to severe LEFT neural foraminal narrowing.

C4-5: Small broad-based disc bulge. Mild RIGHT and severe LEFT facet
arthropathy. Uncovertebral hypertrophy. No canal stenosis. Moderate
to severe LEFT neural foraminal narrowing.

C5-6: Small broad-based disc bulge, uncovertebral hypertrophy.
Moderate LEFT facet arthropathy. No canal stenosis. Mild-to-moderate
bilateral neural foraminal narrowing.

C6-7: Small broad-based disc bulge, small RIGHT > LEFT subarticular
disc protrusions, uncovertebral hypertrophy. No canal stenosis.
Moderate bilateral neural foraminal narrowing.

C7-T1: Small broad-based disc bulge asymmetric to LEFT,
uncovertebral hypertrophy. No canal stenosis. Moderate LEFT neural
foraminal narrowing.
IMPRESSION: 1. No fracture or malalignment.
2. Severe LEFT C4-5 facet arthropathy with superimposed acute
inflammatory changes.
3. No canal stenosis. Neural foraminal narrowing C3-4 through C7-T1:
Moderate to severe on the LEFT at C3-4 and C4-5.

## 2019-09-06 ENCOUNTER — Other Ambulatory Visit: Payer: Self-pay | Admitting: Internal Medicine

## 2019-10-08 ENCOUNTER — Other Ambulatory Visit: Payer: Self-pay

## 2019-10-11 MED ORDER — LORAZEPAM 0.5 MG PO TABS: 0.5000 mg | ORAL_TABLET | Freq: Two times a day (BID) | ORAL | 0 refills | Status: DC | PRN

## 2019-10-11 NOTE — Telephone Encounter (Signed)
Last filled 01-16-18 #30 Last OV 11-17-18 Next OV 12-01-19 [facility]

## 2019-11-19 ENCOUNTER — Other Ambulatory Visit: Payer: Self-pay

## 2019-11-19 ENCOUNTER — Encounter: Payer: Managed Care, Other (non HMO) | Admitting: Internal Medicine

## 2019-11-19 ENCOUNTER — Encounter: Payer: Self-pay | Admitting: Family Medicine

## 2019-11-19 ENCOUNTER — Ambulatory Visit: Payer: Managed Care, Other (non HMO) | Admitting: Family Medicine

## 2019-11-19 VITALS — BP 140/90 | HR 86 | Temp 97.5°F | Ht 60.0 in | Wt 207.0 lb

## 2019-11-19 DIAGNOSIS — R1013 Epigastric pain: Secondary | ICD-10-CM | POA: Diagnosis not present

## 2019-11-19 DIAGNOSIS — R197 Diarrhea, unspecified: Secondary | ICD-10-CM | POA: Diagnosis not present

## 2019-11-19 MED ORDER — OMEPRAZOLE 20 MG PO CPDR: 20.0000 mg | DELAYED_RELEASE_CAPSULE | Freq: Two times a day (BID) | ORAL | 0 refills | Status: DC

## 2019-11-19 NOTE — Progress Notes (Signed)
Subjective:    Patient ID: Yvonne Stephenson, female    DOB: 02/20/1966, 54 y.o.   MRN: [id number]  HPI Chief Complaint  Patient presents with  . GI Problem    Possible gallbladder issues x 2-3 weeks- tenderness at sternum, bloating, pain and pressure in abd from bloating. Pt states that she has a decreased appetite and increased BM after meals. Discomfort in right shoulder blade region.  Pt has increased her Align medication and Omeprazole to see if reflux related but no relief.  Off and on Nausea, no vomiting. Denies fever  This is a 54 yo female who presents today with above cc. Has been having intermittent pain x 2.5 weeks, has pain after eating, upper abdomen and sometimes feels that this though it is radiating into her right arm and right shoulder.. having bowel movements 8-10 times a day. Occasionally loose and watery bowel movements. Pain is stabbing immediately after eating, gets better after BM but still feels bloated. Increased flatulence. Sees GI, Dr. Ferdinand Lango in [place], no recent visits.  No fever. No urinary changes. Has increased her fluid intake to prevent dehydration with diarrhea, frequent bowel movements. Prior to symptoms starting, she had increased stressors with helping her best friend's husband through end-of-life.  She feels that that is the time around which her symptoms started but they have continued despite her stress level returning to normal.  She does not feel that her symptoms are related to stress or irritable bowel.  Review of Systems Per HPI    Objective:   Physical Exam Vitals reviewed.  Constitutional:      General: She is not in acute distress.    Appearance: Normal appearance. She is obese. She is not ill-appearing, toxic-appearing or diaphoretic.  HENT:     Head: Normocephalic and atraumatic.  Eyes:     Conjunctiva/sclera: Conjunctivae normal.  Cardiovascular:     Rate and Rhythm: Normal rate and regular rhythm.     Heart sounds: Normal heart sounds.    Pulmonary:     Effort: Pulmonary effort is normal.     Breath sounds: Normal breath sounds.  Abdominal:     General: Bowel sounds are normal. There is distension (mild).     Palpations: Abdomen is soft. There is no mass.     Tenderness: There is abdominal tenderness (generalized, RUQ, LUQ). There is no guarding or rebound.     Hernia: No hernia is present.  Musculoskeletal:     Comments: Normal gait  Skin:    General: Skin is warm and dry.  Neurological:     Mental Status: She is alert and oriented to person, place, and time.  Psychiatric:        Mood and Affect: Mood normal.        Behavior: Behavior normal.        Thought Content: Thought content normal.        Judgment: Judgment normal.          BP 140/90 (BP Location: Left Arm, Patient Position: Sitting, Cuff Size: Large)   Pulse 86   Temp (!) 97.5 F (36.4 C) (Temporal)   Ht 5' (1.524 m)   Wt 207 lb (93.9 kg)   SpO2 97%   BMI 40.43 kg/m  Wt Readings from Last 3 Encounters:  11/19/19 207 lb (93.9 kg)  01/12/19 194 lb (88 kg)  11/17/18 189 lb (85.7 kg)    Assessment & Plan:  1. Epigastric pain -Increase omeprazole temporarily.  Avoid any identified  food triggers. -Follow-up/ER/UC precautions reviewed - Comprehensive metabolic panel - CBC with Differential - Amylase - US Abdomen Complete; Future  2. Diarrhea, unspecified type -Can increase use of Pepto-Bismol for diarrhea -Discussed obtaining stool studies but patient wants to hold off on these. - Comprehensive metabolic panel - CBC with Differential - Amylase - TSH - US Abdomen Complete; Future  This visit occurred during the SARS-CoV-2 public health emergency.  Safety protocols were in place, including screening questions prior to the visit, additional usage of staff PPE, and extensive cleaning of exam room while observing appropriate contact time as indicated for disinfecting solutions.    Clarene Reamer, FNP-BC  [facility]  11/20/2019 7:24 AM

## 2019-11-19 NOTE — Patient Instructions (Signed)
Good to see you today  Increase your omeprazole to twice a day  Avoid heavy, greasy, dairy foods  You will get a call about scheduling your ultrasound  I will send you a mychart message regarding your labs

## 2019-11-20 LAB — CBC WITH DIFFERENTIAL/PLATELET
Absolute Monocytes: 586 cells/uL (ref 200–950)
Basophils Absolute: 48 cells/uL (ref 0–200)
Basophils Relative: 0.5 %
Eosinophils Absolute: 269 cells/uL (ref 15–500)
Eosinophils Relative: 2.8 %
HCT: 42.5 % (ref 35.0–45.0)
Hemoglobin: 14.4 g/dL (ref 11.7–15.5)
Lymphs Abs: 3226 cells/uL (ref 850–3900)
MCH: 27.4 pg (ref 27.0–33.0)
MCHC: 33.9 g/dL (ref 32.0–36.0)
MCV: 80.8 fL (ref 80.0–100.0)
MPV: 11.8 fL (ref 7.5–12.5)
Monocytes Relative: 6.1 %
Neutro Abs: 5472 cells/uL (ref 1500–7800)
Neutrophils Relative %: 57 %
Platelets: 170 10*3/uL (ref 140–400)
RBC: 5.26 10*6/uL — ABNORMAL HIGH (ref 3.80–5.10)
RDW: 14.2 % (ref 11.0–15.0)
Total Lymphocyte: 33.6 %
WBC: 9.6 10*3/uL (ref 3.8–10.8)

## 2019-11-20 LAB — TSH: TSH: 2.3 mIU/L

## 2019-11-20 LAB — COMPREHENSIVE METABOLIC PANEL
AG Ratio: 1.5 (calc) (ref 1.0–2.5)
ALT: 65 U/L — ABNORMAL HIGH (ref 6–29)
AST: 45 U/L — ABNORMAL HIGH (ref 10–35)
Albumin: 4.5 g/dL (ref 3.6–5.1)
Alkaline phosphatase (APISO): 89 U/L (ref 37–153)
BUN: 12 mg/dL (ref 7–25)
CO2: 25 mmol/L (ref 20–32)
Calcium: 10 mg/dL (ref 8.6–10.4)
Chloride: 103 mmol/L (ref 98–110)
Creat: 0.74 mg/dL (ref 0.50–1.05)
Globulin: 3 g/dL (calc) (ref 1.9–3.7)
Glucose, Bld: 94 mg/dL (ref 65–99)
Potassium: 3.9 mmol/L (ref 3.5–5.3)
Sodium: 139 mmol/L (ref 135–146)
Total Bilirubin: 0.3 mg/dL (ref 0.2–1.2)
Total Protein: 7.5 g/dL (ref 6.1–8.1)

## 2019-11-20 LAB — AMYLASE: Amylase: 48 U/L (ref 21–101)

## 2019-11-23 ENCOUNTER — Ambulatory Visit: Payer: Managed Care, Other (non HMO) | Admitting: Internal Medicine

## 2019-11-26 ENCOUNTER — Other Ambulatory Visit: Payer: Managed Care, Other (non HMO)

## 2019-11-30 ENCOUNTER — Ambulatory Visit
Admission: RE | Admit: 2019-11-30 | Discharge: 2019-11-30 | Disposition: A | Discharge status: Home / Self Care | Payer: Managed Care, Other (non HMO) | Source: Ambulatory Visit | Attending: Family Medicine | Admitting: Family Medicine

## 2019-11-30 ENCOUNTER — Encounter: Payer: Managed Care, Other (non HMO) | Admitting: Internal Medicine

## 2019-11-30 ENCOUNTER — Encounter: Payer: Self-pay | Admitting: Family Medicine

## 2019-11-30 DIAGNOSIS — R197 Diarrhea, unspecified: Secondary | ICD-10-CM

## 2019-11-30 DIAGNOSIS — R1013 Epigastric pain: Secondary | ICD-10-CM

## 2019-12-01 ENCOUNTER — Encounter: Payer: Managed Care, Other (non HMO) | Admitting: Internal Medicine

## 2019-12-11 ENCOUNTER — Other Ambulatory Visit: Payer: Self-pay | Admitting: Internal Medicine

## 2019-12-21 ENCOUNTER — Ambulatory Visit: Payer: Managed Care, Other (non HMO) | Attending: Internal Medicine

## 2019-12-21 DIAGNOSIS — Z20822 Contact with and (suspected) exposure to covid-19: Secondary | ICD-10-CM

## 2019-12-22 LAB — NOVEL CORONAVIRUS, NAA: SARS-CoV-2, NAA: NOT DETECTED

## 2019-12-25 ENCOUNTER — Ambulatory Visit: Payer: Managed Care, Other (non HMO) | Attending: Internal Medicine

## 2019-12-25 DIAGNOSIS — Z23 Encounter for immunization: Secondary | ICD-10-CM

## 2019-12-25 NOTE — Progress Notes (Signed)
   Covid-19 Vaccination Clinic  Name:  Yvonne Stephenson    MRN: [id number] DOB: 16-Mar-1966  12/25/2019  Yvonne Stephenson was observed post Covid-19 immunization for 30 minutes based on pre-vaccination screening without incidence. She was provided with Vaccine Information Sheet and instruction to access the V-Safe system.   Yvonne Stephenson was instructed to call 911 with any severe reactions post vaccine: [name] Difficulty breathing  . Swelling of your face and throat  . A fast heartbeat  . A bad rash all over your body  . Dizziness and weakness    Immunizations Administered    Name Date Dose VIS Date Route   Pfizer COVID-19 Vaccine 12/25/2019 12:45 PM 0.3 mL 10/08/2019 Intramuscular   Manufacturer: [place]   Lot: [id number]   [place]: [id number]

## 2020-01-15 ENCOUNTER — Ambulatory Visit: Payer: Managed Care, Other (non HMO) | Attending: Internal Medicine

## 2020-01-15 DIAGNOSIS — Z23 Encounter for immunization: Secondary | ICD-10-CM

## 2020-01-15 NOTE — Progress Notes (Signed)
   Covid-19 Vaccination Clinic  Name:  Yvonne Stephenson    MRN: [id number] DOB: May 05, 1966  01/15/2020  Yvonne Stephenson was observed post Covid-19 immunization for 30 minutes based on pre-vaccination screening without incident. She was provided with Vaccine Information Sheet and instruction to access the V-Safe system.   Yvonne Stephenson was instructed to call 911 with any severe reactions post vaccine: [name] Difficulty breathing  . Swelling of face and throat  . A fast heartbeat  . A bad rash all over body  . Dizziness and weakness   Immunizations Administered    Name Date Dose VIS Date Route   Pfizer COVID-19 Vaccine 01/15/2020 11:16 AM 0.3 mL 10/08/2019 Intramuscular   Manufacturer: [place]   Lot: [id number]   [place]: [id number]

## 2020-01-19 ENCOUNTER — Ambulatory Visit: Payer: Managed Care, Other (non HMO)

## 2020-02-17 ENCOUNTER — Other Ambulatory Visit: Payer: Self-pay | Admitting: Family Medicine

## 2020-03-03 ENCOUNTER — Other Ambulatory Visit: Payer: Self-pay | Admitting: Internal Medicine

## 2020-03-30 ENCOUNTER — Other Ambulatory Visit: Payer: Self-pay | Admitting: Internal Medicine

## 2020-04-28 ENCOUNTER — Telehealth: Payer: Self-pay | Admitting: Internal Medicine

## 2020-04-28 NOTE — Telephone Encounter (Signed)
Yvonne Stephenson, I filled her medication but she is past due for an office visit. Can you get her scheduled for a CPE soon? Thanks

## 2020-04-28 NOTE — Telephone Encounter (Signed)
I left a message on patient's voice mail to return my call to schedule appointment.

## 2020-05-11 ENCOUNTER — Other Ambulatory Visit: Payer: Self-pay | Admitting: Internal Medicine

## 2020-05-20 ENCOUNTER — Other Ambulatory Visit: Payer: Self-pay | Admitting: Internal Medicine

## 2020-05-30 ENCOUNTER — Ambulatory Visit
Admission: EM | Admit: 2020-05-30 | Discharge: 2020-05-30 | Disposition: A | Discharge status: Home / Self Care | Payer: Self-pay | Attending: Emergency Medicine | Admitting: Emergency Medicine

## 2020-05-30 ENCOUNTER — Telehealth: Payer: Self-pay

## 2020-05-30 DIAGNOSIS — J069 Acute upper respiratory infection, unspecified: Secondary | ICD-10-CM

## 2020-05-30 NOTE — Telephone Encounter (Signed)
Per chart review tab pt is at [place].

## 2020-05-30 NOTE — ED Provider Notes (Signed)
Yvonne Stephenson    CSN: [id number] Arrival date & time: 05/30/20  1001      History   Chief Complaint Chief Complaint  Patient presents with  . Nasal Congestion    HPI Yvonne Stephenson is a 54 y.o. female.   Patient presents with 4-day history of nasal congestion, nonproductive cough, ear pain.  She and her husband recently traveled to [place] and were exposed to smoking.  She denies fever, chills, shortness of breath, abdominal pain, vomiting, diarrhea, rash, or other symptoms.  Treatment attempted with OTC Delsym.  The history is provided by the patient.    Past Medical History:  Diagnosis Date  . Anxiety   . Asthma    only when gets a cold-expired inhaler  . GERD (gastroesophageal reflux disease)   . Hyperlipidemia   . Hypertension   . PONV (postoperative nausea and vomiting)     Patient Active Problem List   Diagnosis Date Noted  . Abnormal mammogram of left breast 11/27/2016  . Mild intermittent asthma 08/26/2016  . Asthma with acute exacerbation 08/12/2016  . Bowel habit changes 10/26/2015  . Preventative health care 07/10/2012  . Fatty infiltration of liver 01/14/2012  . Anxiety attack 11/01/2011  . Cough 11/02/2008  . Hypertriglyceridemia 09/10/2007  . ANXIETY 09/10/2007  . Essential hypertension, benign 09/10/2007  . GERD 09/10/2007    Past Surgical History:  Procedure Laterality Date  . ABDOMINAL HYSTERECTOMY  07-2006  . BREAST BIOPSY Left   . BREAST EXCISIONAL BIOPSY Right   . BREAST SURGERY  2004   LUMPECTOMY  . CARPAL TUNNEL RELEASE  4-10   LEFT (Dr. Tommie Raymond)  . CESAREAN SECTION     3  . LAPAROSCOPY  04/09/2012   Procedure: LAPAROSCOPY OPERATIVE;  Surgeon: Donnamae Jude, MD;  Location: [facility];  Service: Gynecology;  Laterality: N/A;  . NASAL STENOSIS REPAIR  2003   . SALPINGOOPHORECTOMY  04/09/2012   Procedure: SALPINGO OOPHERECTOMY;  Surgeon: Donnamae Jude, MD;  Location: [facility];  Service: Gynecology;  Laterality: Right;  . TUBAL  LIGATION      OB History    Gravida  4   Para  3   Term      Preterm      AB  1   Living  3     SAB  1   TAB      Ectopic      Multiple      Live Births               Home Medications    Prior to Admission medications   Medication Sig Start Date End Date Taking? Authorizing Provider  bisoprolol (ZEBETA) 5 MG tablet TAKE 1 TABLET (5 MG TOTAL) BY MOUTH DAILY. NEEDS OFFICE VISIT 04/28/20   Venia Carbon, MD  CARAFATE 1 GM/10ML suspension TAKE 10 MLS (1 G TOTAL) BY MOUTH 2 (TWO) TIMES DAILY. 09/06/19   Venia Carbon, MD  LORazepam (ATIVAN) 0.5 MG tablet Take 1 tablet (0.5 mg total) by mouth 2 (two) times daily as needed for anxiety. 10/11/19   Venia Carbon, MD  Multiple Vitamins-Minerals (AIRBORNE PO) Take 1 tablet by mouth every Monday, Tuesday, Wednesday, Thursday, and Friday.    [provider]  omeprazole (PRILOSEC) 20 MG capsule TAKE 1 CAPSULE (20 MG TOTAL) BY MOUTH 2 (TWO) TIMES DAILY BEFORE A MEAL. 05/22/20   Venia Carbon, MD  oseltamivir (TAMIFLU) 75 MG capsule Take 1 capsule (75 mg  total) by mouth daily. 11/18/18   Venia Carbon, MD  PROAIR HFA 108 [phone] Base) MCG/ACT inhaler INHALE 2 PUFFS INTO THE LUNGS 3 (THREE) TIMES DAILY AS NEEDED FOR WHEEZING OR SHORTNESS OF BREATH. 03/24/17   Venia Carbon, MD  QVAR 40 MCG/ACT inhaler INHALE 2 PUFFS INTO THE LUNGS 2 (TWO) TIMES DAILY. 08/12/17   Venia Carbon, MD    Family History Family History  Problem Relation Age of Onset  . Heart attack Mother   . Heart disease Mother   . Hypertension Mother   . Stroke Mother   . Lung cancer Mother        smoked  . Allergies Mother   . Heart attack Father   . Heart disease Father   . Hypertension Father   . Diabetes Maternal Uncle   . Cancer Maternal Grandmother   . Heart failure Maternal Grandfather   . Heart failure Paternal Grandmother     Social History Social History   Tobacco Use  . Smoking status: Never Smoker  . Smokeless  tobacco: Never Used  Substance Use Topics  . Alcohol use: Yes    Comment: occasionaly  . Drug use: No     Allergies   Penicillins, Shellfish allergy, Amoxicillin-pot clavulanate, Atorvastatin, Cefprozil, Demerol, Dilaudid [hydromorphone hcl], Doxycycline, Meloxicam [meloxicam], and Zithromax [azithromycin]   Review of Systems Review of Systems  Constitutional: Negative for chills and fever.  HENT: Positive for congestion and ear pain. Negative for sore throat.   Eyes: Negative for pain and visual disturbance.  Respiratory: Positive for cough. Negative for shortness of breath.   Cardiovascular: Negative for chest pain and palpitations.  Gastrointestinal: Negative for abdominal pain, diarrhea, nausea and vomiting.  Genitourinary: Negative for dysuria and hematuria.  Musculoskeletal: Negative for arthralgias and back pain.  Skin: Negative for color change and rash.  Neurological: Negative for seizures and syncope.  All other systems reviewed and are negative.    Physical Exam Triage Vital Signs ED Triage Vitals  Enc Vitals Group     BP 05/30/20 1011 (!) 152/83     Pulse Rate 05/30/20 1011 81     Resp 05/30/20 1011 16     Temp 05/30/20 1011 99.1 F (37.3 C)     Temp Source 05/30/20 1011 Oral     SpO2 05/30/20 1011 95 %     Weight --      Height --      Head Circumference --      Peak Flow --      Pain Score 05/30/20 1013 0     Pain Loc --      Pain Edu? --      Excl. in [place]? --    No data found.  Updated Vital Signs BP (!) 152/83 (BP Location: Right Arm)   Pulse 81   Temp 99.1 F (37.3 C) (Oral)   Resp 16   SpO2 95%   Visual Acuity Right Eye Distance:   Left Eye Distance:   Bilateral Distance:    Right Eye Near:   Left Eye Near:    Bilateral Near:     Physical Exam Vitals and nursing note reviewed.  Constitutional:      General: She is not in acute distress.    Appearance: She is well-developed. She is not ill-appearing.  HENT:     Head:  Normocephalic and atraumatic.     Right Ear: Tympanic membrane normal.     Left Ear: Tympanic membrane normal.  Nose: Nose normal.     Mouth/Throat:     Mouth: Mucous membranes are moist.     Pharynx: Oropharynx is clear.  Eyes:     Conjunctiva/sclera: Conjunctivae normal.  Cardiovascular:     Rate and Rhythm: Normal rate and regular rhythm.     Heart sounds: No murmur heard.   Pulmonary:     Effort: Pulmonary effort is normal. No respiratory distress.     Breath sounds: Normal breath sounds.  Abdominal:     Palpations: Abdomen is soft.     Tenderness: There is no abdominal tenderness. There is no guarding or rebound.  Musculoskeletal:     Cervical back: Neck supple.  Skin:    General: Skin is warm and dry.     Findings: No rash.  Neurological:     General: No focal deficit present.     Mental Status: She is alert and oriented to person, place, and time.     Gait: Gait normal.  Psychiatric:        Mood and Affect: Mood normal.        Behavior: Behavior normal.      UC Treatments / Results  Labs (all labs ordered are listed, but only abnormal results are displayed) Labs Reviewed  NOVEL CORONAVIRUS, NAA    EKG   Radiology No results found.  Procedures Procedures (including critical care time)  Medications Ordered in UC Medications - No data to display  Initial Impression / Assessment and Plan / UC Course  I have reviewed the triage vital signs and the nursing notes.  Pertinent labs & imaging results that were available during my care of the patient were reviewed by me and considered in my medical decision making (see chart for details).   Viral upper respiratory infection.  PCR COVID pending.  Instructed patient to self quarantine until the test result is back.  Symptomatic treatment discussed.  Instructed patient to follow-up with her PCP if her symptoms or not improving.  Patient agrees to plan of care.      Final Clinical Impressions(s) / UC  Diagnoses   Final diagnoses:  Viral URI     Discharge Instructions     Your COVID test is pending.  You should self quarantine until the test result is back.    Follow up with your primary care provider if your symptoms are not improving.       ED Prescriptions    None     PDMP not reviewed this encounter.   Sharion Balloon, NP 05/30/20 1056

## 2020-05-30 NOTE — Discharge Instructions (Signed)
Your COVID test is pending.  You should self quarantine until the test result is back.    Follow up with your primary care provider if your symptoms are not improving.      

## 2020-05-30 NOTE — Telephone Encounter (Signed)
Okay, noted Will await her results there

## 2020-05-30 NOTE — Telephone Encounter (Signed)
[facility] Night - Client TELEPHONE ADVICE RECORD [facility] Patient Name: Yvonne Stephenson Gender: Female DOB: 05-23-1966 Age: 54 Y 68 M 30 D Return Phone Number: [phone] (Primary) Address: City/State/Zip: [place] [address] Client [facility] Night - Client Client Site [place] Physician Viviana Simpler- MD Contact Type Call Who Is Calling Patient / Member / Family / Caregiver Call Type Triage / Clinical Relationship To Patient Self Return Phone Number [phone] (Primary) Chief Complaint Health information question (non symptomatic) Reason for Call Symptomatic / Request for Health Information Initial Comment (1/2) Caller states she and her husband have colds and they both have been vaccinated. She wants to know where to go to get a test. She states he has a deep cough. Translation No Nurse Assessment Nurse: Thad Ranger, RN, Denise Date/Time (Eastern Time): 05/30/2020 8:22:14 AM Confirm and document reason for call. If symptomatic, describe symptoms. ---(1/2) Caller states she and her husband have colds and they both have been vaccinated. She wants to know where to go to get a test. She states she has nasal congestion and a cough. Has the patient had close contact with a person known or suspected to have the novel coronavirus illness OR traveled / lives in area with major community spread (including international travel) in the last 14 days from the onset of symptoms? * If Asymptomatic, screen for exposure and travel within the last 14 days. ---No Does the patient have any new or worsening symptoms? ---Yes Will a triage be completed? ---Yes Related visit to physician within the last 2 weeks? ---No Does the PT have any chronic conditions? (i.e. diabetes, asthma, this includes High risk factors for pregnancy, etc.) ---No Is the patient pregnant or possibly pregnant? (Ask all females between  the ages of 43-55) ---No Is this a behavioral health or substance abuse call? ---No Guidelines Guideline Title Affirmed Question Affirmed Notes Nurse Date/Time Eilene Ghazi Time) Common Cold Earache Carmon, RN, Langley Gauss 05/30/2020 8:23:41 AM Disp. Time Eilene Ghazi Time) Disposition Final User 05/30/2020 8:27:44 AM See PCP within 24 Hours Yes Carmon, RN, Langley Gauss PLEASE NOTE: All timestamps contained within this report are represented as [place] Standard Time. CONFIDENTIALTY NOTICE: This fax transmission is intended only for the addressee. It contains information that is legally privileged, confidential or otherwise protected from use or disclosure. If you are not the intended recipient, you are strictly prohibited from reviewing, disclosing, copying using or disseminating any of this information or taking any action in reliance on or regarding this information. If you have received this fax in error, please notify us immediately by telephone so that we can arrange for its return to [place]. Phone: [phone], Toll-Free: [phone], Fax: [phone] Page: 2 of 2 Call Id: [id number] Shepherd Disagree/Comply Comply Caller Understands Yes PreDisposition Call Doctor Care Advice Given Per Guideline SEE PCP WITHIN 24 HOURS: PAIN MEDICINES: * IBUPROFEN (E.G., MOTRIN, ADVIL): Take 400 mg (two 200 mg pills) by mouth every 6 hours. The most you should take each day is 1,200 mg (six 200 mg pills), unless your doctor has told you to take more. * Introduction: Saline (salt water) nasal irrigation (nasal wash) is an effective and simple home remedy for treating stuffy nose and sinus congestion. The nose can be irrigated by pouring, spraying, or squirting salt water into the nose and then letting it run back out. * How it Helps: The salt water rinses out excess mucus and washes out any irritants (dust, allergens) that might be  present. It also moistens the nasal cavity. * STEP 4: Blow your nose to clean out the water and  mucus. * Pseudoephedrine (Sudafed): Available over-the-counter in pill form. Typical adult dosage is two 30 mg tablets every 6 hours. NASAL DECONGESTANTS FOR A VERY STUFFY NOSE: HUMIDIFIER: * If the air in your home is dry, use a humidifier. CALL BACK IF: * Fever over 104 F (40 C) * You have difficulty breathing * You become worse. CARE ADVICE given per Common Cold (Adult) guideline. Referrals REFERRED TO PCP OFFICE

## 2020-05-30 NOTE — ED Triage Notes (Addendum)
Pt presents to UC for nasal congestion, cough, and ear pain x4 days. Pt states she was exposed to smoke which usually triggers her allergies. 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 Pt denies fevers, chills, loss of taste or smell, n/v/d.

## 2020-05-31 ENCOUNTER — Telehealth: Payer: Self-pay

## 2020-05-31 LAB — SARS-COV-2, NAA 2 DAY TAT

## 2020-05-31 LAB — NOVEL CORONAVIRUS, NAA: SARS-CoV-2, NAA: DETECTED — AB

## 2020-05-31 NOTE — Telephone Encounter (Signed)
Pt called and could not tell if covid lab results pt got mean that pt has covid or not. Pt seen [facility] in [place] on 05/30/20 and was tested for covid; per pt's lab result note covid was detected. Pt said she has dry cough that is not bad,nasal congestion and sneezing and loss of taste and smell. No other covid symptoms. Pt had pfizer covid vaccine on 12/25/19 & 01/15/20. Pt did travel to [place] 05-18-20 and returned home on 05/24/20; pt was in [place] on 05/23/20 and did not wear mask a lot while gone. Pt developed above symptoms on 05/26/20 and was tested 05/30/20.pt got positive covid results today. Pt has no fever. Advised to take tylenol if develops fever (pt said not allergic to tylenol), drink plenty of fluids and rest. Pt will self quarantine 14 days starting today. UC & ED precautions given and pt voiced understanding.FYI to Dr Silvio Pate.

## 2020-05-31 NOTE — Telephone Encounter (Signed)
COVID positive.  Left message for patient to call back to discuss lab results.  Referral to infusion clinic made.

## 2020-06-01 ENCOUNTER — Telehealth: Payer: Self-pay | Admitting: Nurse Practitioner

## 2020-06-01 NOTE — Telephone Encounter (Signed)
Agreed. She clearly has it--though will hopefully be mild due to past vaccination. Doubt infusion therapy will be particularly helpful with mild symptoms. Would recommend only 10 days quarantine as long as symptoms are gone within the next couple of days

## 2020-06-01 NOTE — Telephone Encounter (Signed)
Okay---glad to hear she has minimal symptoms

## 2020-06-01 NOTE — Telephone Encounter (Signed)
Spoke to pt

## 2020-06-01 NOTE — Telephone Encounter (Signed)
Patient called in stating she has received more calls from the infusion center to be scheduled. Patient is confused and would like to know what PCP would like her to do from this point. Please advise.

## 2020-06-01 NOTE — Telephone Encounter (Signed)
If she has mild nasal congestion but doesn't feel sick (and since she got the vaccine)---I would recommend not getting the infusion

## 2020-06-01 NOTE — Telephone Encounter (Signed)
Pt notified as instructed and voiced understanding. Pt said she had gotten a questionaire from [facility] and pt said to let Dr Silvio Pate know that she is doing well; pt said "I feel great" except for slight nasal congestion and cough. Pt will call [facility] if condition changes or worsens,FYI to Dr Silvio Pate.

## 2020-06-01 NOTE — Telephone Encounter (Signed)
Called to Discuss with patient about Covid symptoms and the use of bamlanivimab, a monoclonal antibody infusion for those with mild to moderate Covid symptoms and at a high risk of hospitalization.     Pt is qualified for this infusion at the [facility] infusion center due to co-morbid conditions and/or a member of an at-risk group.     Patient Active Problem List   Diagnosis Date Noted  . Abnormal mammogram of left breast 11/27/2016  . Mild intermittent asthma 08/26/2016  . Asthma with acute exacerbation 08/12/2016  . Bowel habit changes 10/26/2015  . Preventative health care 07/10/2012  . Fatty infiltration of liver 01/14/2012  . Anxiety attack 11/01/2011  . Cough 11/02/2008  . Hypertriglyceridemia 09/10/2007  . ANXIETY 09/10/2007  . Essential hypertension, benign 09/10/2007  . GERD 09/10/2007    Patient declines infusion at this time. Symptoms tier reviewed as well as criteria for ending isolation. Preventative practices reviewed. Patient verbalized understanding.    Patient advised to call back if she decides that she does want to get infusion. Callback number to the infusion center given. Patient advised to go to Urgent care or ED with severe symptoms.

## 2020-06-26 ENCOUNTER — Other Ambulatory Visit: Payer: Self-pay | Admitting: Internal Medicine

## 2020-06-26 MED ORDER — ALBUTEROL SULFATE HFA 108 (90 BASE) MCG/ACT IN AERS: 2.0000 | INHALATION_SPRAY | Freq: Four times a day (QID) | RESPIRATORY_TRACT | 1 refills | Status: DC | PRN

## 2020-07-07 ENCOUNTER — Telehealth: Payer: Self-pay

## 2020-07-07 ENCOUNTER — Other Ambulatory Visit: Payer: Self-pay | Admitting: Internal Medicine

## 2020-07-07 DIAGNOSIS — Z1231 Encounter for screening mammogram for malignant neoplasm of breast: Secondary | ICD-10-CM

## 2020-07-07 NOTE — Telephone Encounter (Signed)
Last filled 10-01-19 #30 Last OV with Dr Silvio Pate 11-16-18 No Future OV (Pt made aware needs OV) [facility]

## 2020-07-07 NOTE — Telephone Encounter (Signed)
Last OV with Dr Silvio Pate 11-17-18. Saw Debbie in [place] for Epigastric pain. We will address this in the refill request.

## 2020-07-08 MED ORDER — LORAZEPAM 0.5 MG PO TABS: 0.5000 mg | ORAL_TABLET | Freq: Two times a day (BID) | ORAL | 0 refills | Status: DC | PRN

## 2020-07-08 NOTE — Telephone Encounter (Signed)
Please have her schedule a PE in the next few months

## 2020-07-11 NOTE — Telephone Encounter (Signed)
Patient already has appointment on 10/18/20.

## 2020-07-19 ENCOUNTER — Other Ambulatory Visit: Payer: Self-pay | Admitting: Orthopedic Surgery

## 2020-07-19 DIAGNOSIS — M75101 Unspecified rotator cuff tear or rupture of right shoulder, not specified as traumatic: Secondary | ICD-10-CM

## 2020-07-19 DIAGNOSIS — M25511 Pain in right shoulder: Secondary | ICD-10-CM

## 2020-08-12 ENCOUNTER — Other Ambulatory Visit: Payer: Self-pay

## 2020-08-12 ENCOUNTER — Ambulatory Visit
Admission: RE | Admit: 2020-08-12 | Discharge: 2020-08-12 | Disposition: A | Discharge status: Home / Self Care | Payer: Managed Care, Other (non HMO) | Source: Ambulatory Visit | Attending: Orthopedic Surgery | Admitting: Orthopedic Surgery

## 2020-08-12 DIAGNOSIS — M25511 Pain in right shoulder: Secondary | ICD-10-CM

## 2020-08-12 DIAGNOSIS — M75101 Unspecified rotator cuff tear or rupture of right shoulder, not specified as traumatic: Secondary | ICD-10-CM

## 2020-08-26 ENCOUNTER — Other Ambulatory Visit: Payer: Self-pay | Admitting: Internal Medicine

## 2020-09-02 ENCOUNTER — Telehealth (INDEPENDENT_AMBULATORY_CARE_PROVIDER_SITE_OTHER): Payer: Managed Care, Other (non HMO) | Admitting: Family Medicine

## 2020-09-02 ENCOUNTER — Encounter: Payer: Self-pay | Admitting: Family Medicine

## 2020-09-02 ENCOUNTER — Other Ambulatory Visit: Payer: Self-pay

## 2020-09-02 DIAGNOSIS — J019 Acute sinusitis, unspecified: Secondary | ICD-10-CM | POA: Diagnosis not present

## 2020-09-02 MED ORDER — CLINDAMYCIN HCL 300 MG PO CAPS
300.0000 mg | ORAL_CAPSULE | Freq: Three times a day (TID) | ORAL | 0 refills | Status: DC
Start: 2020-09-02 — End: 2020-09-27

## 2020-09-02 NOTE — Progress Notes (Signed)
Virtual visit completed through WebEx or similar program Patient location: home  Provider location: [facility], office  Participants: Patient and me (unless stated otherwise below)  Pandemic considerations d/w pt.   Limitations and rationale for visit method d/w patient.  Patient agreed to proceed.   CC: URI.    HPI:  She had prev covid vaccine with booster on 08/26/20.  She felt sluggish thereafter.  The next day felt worse.  Took aspirin. Then had chills.  Temp 101 on 10/31 and 11/1.  Works in a school, was out of work. Rhinorrhea started 11/1, continues.  No fevers since Monday.  Sinus pressure and facial pain more recently.  No cough except for rare dry cough.  No chest sx.  Throat irritation and head congestion.  No wheeze.  No changes in taste or smell.  Not SOB.  She has crackling in the ears B.  Sinuses are tender to palpation, esp frontal area.  She is about the same as yesterday, "not miserable" but not back to normal.    Meds and allergies reviewed.   ROS: Per HPI unless specifically indicated in ROS section   NAD Speech wnl  A/P: Presumed sinusitis.  She likely had initial expected/common sx from the covid booster with separate sx in the meantime attributable to sinusitis.  D/w pt about options.   Rest, fluids, nasal saline and flonase if possible.  Hold abx for now, clindamycin is a reasonable option- d/w pt.  Start if sx persist.  routine cautions given to patient re: f/u.  She agrees with plan.

## 2020-09-02 NOTE — Assessment & Plan Note (Addendum)
Presumed sinusitis.  She likely had initial expected/common sx from the covid booster with separate sx in the meantime attributable to sinusitis.  D/w pt about options.   Rest, fluids, nasal saline and flonase if possible.  Hold abx for now, clindamycin is a reasonable option- d/w pt.  Start if sx persist.  routine cautions given to patient re: f/u.  She agrees with plan.

## 2020-09-07 ENCOUNTER — Ambulatory Visit: Payer: Self-pay

## 2020-09-19 ENCOUNTER — Other Ambulatory Visit: Payer: Self-pay | Admitting: Internal Medicine

## 2020-09-20 ENCOUNTER — Other Ambulatory Visit: Payer: Self-pay | Admitting: Internal Medicine

## 2020-09-27 ENCOUNTER — Emergency Department (HOSPITAL_COMMUNITY)
Admission: EM | Admit: 2020-09-27 | Discharge: 2020-09-27 | Disposition: A | Discharge status: Home / Self Care | Payer: Managed Care, Other (non HMO) | Attending: Emergency Medicine | Admitting: Emergency Medicine

## 2020-09-27 ENCOUNTER — Emergency Department (HOSPITAL_COMMUNITY): Payer: Managed Care, Other (non HMO)

## 2020-09-27 ENCOUNTER — Other Ambulatory Visit: Payer: Self-pay

## 2020-09-27 ENCOUNTER — Telehealth: Payer: Self-pay

## 2020-09-27 DIAGNOSIS — Z7982 Long term (current) use of aspirin: Secondary | ICD-10-CM | POA: Insufficient documentation

## 2020-09-27 DIAGNOSIS — W228XXA Striking against or struck by other objects, initial encounter: Secondary | ICD-10-CM | POA: Insufficient documentation

## 2020-09-27 DIAGNOSIS — R519 Headache, unspecified: Secondary | ICD-10-CM | POA: Diagnosis not present

## 2020-09-27 DIAGNOSIS — I10 Essential (primary) hypertension: Secondary | ICD-10-CM | POA: Diagnosis not present

## 2020-09-27 DIAGNOSIS — Z79899 Other long term (current) drug therapy: Secondary | ICD-10-CM | POA: Insufficient documentation

## 2020-09-27 DIAGNOSIS — S0083XA Contusion of other part of head, initial encounter: Secondary | ICD-10-CM | POA: Insufficient documentation

## 2020-09-27 DIAGNOSIS — S0990XA Unspecified injury of head, initial encounter: Secondary | ICD-10-CM | POA: Diagnosis present

## 2020-09-27 DIAGNOSIS — J0100 Acute maxillary sinusitis, unspecified: Secondary | ICD-10-CM | POA: Insufficient documentation

## 2020-09-27 MED ORDER — CLINDAMYCIN HCL 300 MG PO CAPS: 300.0000 mg | ORAL_CAPSULE | Freq: Four times a day (QID) | ORAL | 0 refills | Status: AC

## 2020-09-27 NOTE — Discharge Instructions (Signed)
Follow instructions regarding head injury precautions. Take the antibiotics as directed to help with your sinusitis. 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  This is an incidental finding but it is something to tell your primary care provider about. Follow-up with your primary care provider. Return to the ER if you start to experience worsening headache, blurry vision, additional injuries, numbness in arms or legs or trouble walking.
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
## 2020-09-27 NOTE — Telephone Encounter (Addendum)
Per chart review tab pt is presently at [facility] ED. Sending note to Dr Silvio Pate who is out of office and Avie Echevaria NP who is in office.

## 2020-09-27 NOTE — ED Provider Notes (Signed)
[facility] EMERGENCY DEPARTMENT Provider Note   CSN: [id number] Arrival date & time: 09/27/20  1301     History No chief complaint on file.   Yvonne Stephenson is a 54 y.o. female with a past medical history of hypertension, anxiety presenting to the ED with a chief complaint of nose pain.  Approximately 5 hours ago was helping her husband when a ladder fell and hit her between her eyes.  She denies any loss of consciousness.  She reports feeling dizzy, having blurry vision and pain and swelling in the area.  She called her ENT provider who told her they were unable to schedule an appointment for her today.  She called her PCP but due to her symptoms was told to come to the ER for a CT scan.  Patient states that her symptoms have gradually improved.  She still has a headache but has not taken any medications to help with symptoms.  She does admit that for the past 10 days she has had sinus pressure and congestion.  Denies any pain with EOMs, nosebleeds, anticoagulant use, neck pain, other injury or trauma.  HPI     Past Medical History:  Diagnosis Date  . Anxiety   . Asthma    only when gets a cold-expired inhaler  . GERD (gastroesophageal reflux disease)   . Hyperlipidemia   . Hypertension   . PONV (postoperative nausea and vomiting)     Patient Active Problem List   Diagnosis Date Noted  . Abnormal mammogram of left breast 11/27/2016  . Mild intermittent asthma 08/26/2016  . Asthma with acute exacerbation 08/12/2016  . Bowel habit changes 10/26/2015  . Acute non-recurrent sinusitis 11/25/2013  . Preventative health care 07/10/2012  . Fatty infiltration of liver 01/14/2012  . Anxiety attack 11/01/2011  . Cough 11/02/2008  . Hypertriglyceridemia 09/10/2007  . ANXIETY 09/10/2007  . Essential hypertension, benign 09/10/2007  . GERD 09/10/2007    Past Surgical History:  Procedure Laterality Date  . ABDOMINAL HYSTERECTOMY  07-2006  . BREAST BIOPSY Left     . BREAST EXCISIONAL BIOPSY Right   . BREAST SURGERY  2004   LUMPECTOMY  . CARPAL TUNNEL RELEASE  4-10   LEFT (Dr. Tommie Raymond)  . CESAREAN SECTION     3  . LAPAROSCOPY  04/09/2012   Procedure: LAPAROSCOPY OPERATIVE;  Surgeon: Donnamae Jude, MD;  Location: [facility];  Service: Gynecology;  Laterality: N/A;  . NASAL STENOSIS REPAIR  2003   . SALPINGOOPHORECTOMY  04/09/2012   Procedure: SALPINGO OOPHERECTOMY;  Surgeon: Donnamae Jude, MD;  Location: [facility];  Service: Gynecology;  Laterality: Right;  . TUBAL LIGATION       OB History    Gravida  4   Para  3   Term      Preterm      AB  1   Living  3     SAB  1   TAB      Ectopic      Multiple      Live Births              Family History  Problem Relation Age of Onset  . Heart attack Mother   . Heart disease Mother   . Hypertension Mother   . Stroke Mother   . Lung cancer Mother        smoked  . Allergies Mother   . Heart attack Father   . Heart disease Father   .  Hypertension Father   . Diabetes Maternal Uncle   . Cancer Maternal Grandmother   . Heart failure Maternal Grandfather   . Heart failure Paternal Grandmother     Social History   Tobacco Use  . Smoking status: Never Smoker  . Smokeless tobacco: Never Used  Substance Use Topics  . Alcohol use: Yes    Comment: occasionaly  . Drug use: No    Home Medications Prior to Admission medications   Medication Sig Start Date End Date Taking? Authorizing Provider  albuterol (VENTOLIN HFA) 108 (90 Base) MCG/ACT inhaler INHALE 2 PUFFS BY MOUTH EVERY 6 HOURS AS NEEDED FOR WHEEZE OR SHORTNESS OF BREATH 09/20/20   Venia Carbon, MD  aspirin 81 MG chewable tablet Chew 81 mg by mouth daily.    [provider]  bisoprolol (ZEBETA) 5 MG tablet Take 1 tablet (5 mg total) by mouth daily. 09/19/20   Venia Carbon, MD  clindamycin (CLEOCIN) 300 MG capsule Take 1 capsule (300 mg total) by mouth every 6 (six) hours for 7 days. 09/27/20 10/04/20  Bernard Slayden,  Ciria Bernardini, PA-C  LORazepam (ATIVAN) 0.5 MG tablet Take 1 tablet (0.5 mg total) by mouth 2 (two) times daily as needed for anxiety. 07/08/20   Venia Carbon, MD  Multiple Vitamins-Minerals (AIRBORNE PO) Take 1 tablet by mouth every Monday, Tuesday, Wednesday, Thursday, and Friday.    [provider]  omeprazole (PRILOSEC) 20 MG capsule TAKE 1 CAPSULE BY MOUTH EVERY DAY 08/26/20   Venia Carbon, MD  QVAR 40 MCG/ACT inhaler INHALE 2 PUFFS INTO THE LUNGS 2 (TWO) TIMES DAILY. 08/12/17   Venia Carbon, MD  sucralfate (CARAFATE) 1 GM/10ML suspension TAKE 10 MLS (1 G TOTAL) BY MOUTH 2 (TWO) TIMES DAILY. 08/26/20   Venia Carbon, MD  vitamin B-12 (CYANOCOBALAMIN) 1000 MCG tablet Take 1,000 mcg by mouth daily.    [provider]    Allergies    Penicillins, Shellfish allergy, Amoxicillin-pot clavulanate, Atorvastatin, Cefprozil, Demerol, Dilaudid [hydromorphone hcl], Doxycycline, Meloxicam [meloxicam], and Zithromax [azithromycin]  Review of Systems   Review of Systems  Constitutional: Negative for appetite change, chills and fever.  HENT: Positive for facial swelling. Negative for ear pain, rhinorrhea, sneezing and sore throat.   Eyes: Negative for photophobia and visual disturbance.  Respiratory: Negative for cough, chest tightness, shortness of breath and wheezing.   Cardiovascular: Negative for chest pain and palpitations.  Gastrointestinal: Negative for abdominal pain, blood in stool, constipation, diarrhea, nausea and vomiting.  Genitourinary: Negative for dysuria, hematuria and urgency.  Musculoskeletal: Negative for myalgias.  Skin: Negative for rash.  Neurological: Positive for headaches. Negative for dizziness, syncope, weakness and light-headedness.    Physical Exam Updated Vital Signs BP (!) 142/72 (BP Location: Right Arm)   Pulse 66   Temp 98 F (36.7 C) (Oral)   Resp 18   SpO2 100%   Physical Exam Vitals and nursing note reviewed.  Constitutional:       General: She is not in acute distress.    Appearance: She is well-developed.  HENT:     Head: Normocephalic and atraumatic.     Right Ear: Tympanic membrane normal.     Left Ear: Tympanic membrane normal.     Nose:     Right Sinus: Maxillary sinus tenderness present.     Left Sinus: Maxillary sinus tenderness present.      Comments: Tenderness palpation of the indicated area without significant edema or ecchymosis noted. Eyes:  General: No scleral icterus.       Right eye: No discharge.        Left eye: No discharge.     Extraocular Movements: Extraocular movements intact.     Conjunctiva/sclera: Conjunctivae normal.     Pupils: Pupils are equal, round, and reactive to light.  Cardiovascular:     Rate and Rhythm: Normal rate and regular rhythm.     Heart sounds: Normal heart sounds. No murmur heard.  No friction rub. No gallop.   Pulmonary:     Effort: Pulmonary effort is normal. No respiratory distress.     Breath sounds: Normal breath sounds.  Abdominal:     General: Bowel sounds are normal. There is no distension.     Palpations: Abdomen is soft.     Tenderness: There is no abdominal tenderness. There is no guarding.  Musculoskeletal:        General: Normal range of motion.     Cervical back: Normal range of motion and neck supple.  Skin:    General: Skin is warm and dry.     Findings: No rash.  Neurological:     General: No focal deficit present.     Mental Status: She is alert and oriented to person, place, and time.     Cranial Nerves: No cranial nerve deficit.     Sensory: No sensory deficit.     Motor: No weakness or abnormal muscle tone.     Coordination: Coordination normal.     Comments: Alert and oriented x3. Pupils reactive. No facial asymmetry noted. Cranial nerves appear grossly intact. Sensation intact to light touch on face, BUE and BLE. Strength 5/5 in BUE and BLE.      ED Results / Procedures / Treatments   Labs (all labs ordered are listed,  but only abnormal results are displayed) Labs Reviewed - No data to display  EKG None  Radiology CT Head Wo Contrast  Result Date: 09/27/2020 CLINICAL DATA:  Facial trauma. Additional history obtained from EMR triage note: Patient reports being hit in the nose with a ladder EXAM: CT HEAD WITHOUT CONTRAST CT MAXILLOFACIAL WITHOUT CONTRAST TECHNIQUE: Multidetector CT imaging of the head and maxillofacial structures were performed using the standard protocol without intravenous contrast. Multiplanar CT image reconstructions of the maxillofacial structures were also generated. COMPARISON:  Head CT 01/14/2007. FINDINGS: CT HEAD FINDINGS Brain: Cerebral volume is normal for age. There is no acute intracranial hemorrhage. No demarcated cortical infarct. No extra-axial fluid collection. No evidence of intracranial mass. No midline shift. Partially empty sella turcica. Vascular: No hyperdense vessel.  Atherosclerotic calcifications. Skull: Normal. Negative for fracture or focal lesion. CT MAXILLOFACIAL FINDINGS Osseous: No evidence of acute maxillofacial fracture. Orbits: No acute finding. The globes are normal in size and contour. The extraocular muscles and optic nerve sheath complexes are symmetric and unremarkable. Sinuses: Frothy secretions and moderate mucosal thickening within the left maxillary sinus. Mild right maxillary sinus mucosal thickening. Frothy secretions within left ethmoid air cells. Soft tissues: No maxillofacial soft tissue swelling appreciable by CT. Other: Incidentally noted, there is periapical lucency surrounding the right mandibular first and second premolars IMPRESSION: CT head: 1. No evidence of acute intracranial abnormality. 2. Partially empty sella turcica. This finding is very commonly incidental, but can be associated with idiopathic intracranial hypertension. CT maxillofacial: 1. No evidence of acute maxillofacial fracture. 2. Left ethmoid and maxillary sinusitis as described. 3.  Mild right maxillary sinus mucosal thickening. 4. Incidentally noted periapical lucency surrounding the  right mandibular first and second premolars. Electronically Signed   By: Kellie Simmering DO   On: 09/27/2020 14:59   CT Maxillofacial Wo Contrast  Result Date: 09/27/2020 CLINICAL DATA:  Facial trauma. Additional history obtained from EMR triage note: Patient reports being hit in the nose with a ladder EXAM: CT HEAD WITHOUT CONTRAST CT MAXILLOFACIAL WITHOUT CONTRAST TECHNIQUE: Multidetector CT imaging of the head and maxillofacial structures were performed using the standard protocol without intravenous contrast. Multiplanar CT image reconstructions of the maxillofacial structures were also generated. COMPARISON:  Head CT 01/14/2007. FINDINGS: CT HEAD FINDINGS Brain: Cerebral volume is normal for age. There is no acute intracranial hemorrhage. No demarcated cortical infarct. No extra-axial fluid collection. No evidence of intracranial mass. No midline shift. Partially empty sella turcica. Vascular: No hyperdense vessel.  Atherosclerotic calcifications. Skull: Normal. Negative for fracture or focal lesion. CT MAXILLOFACIAL FINDINGS Osseous: No evidence of acute maxillofacial fracture. Orbits: No acute finding. The globes are normal in size and contour. The extraocular muscles and optic nerve sheath complexes are symmetric and unremarkable. Sinuses: Frothy secretions and moderate mucosal thickening within the left maxillary sinus. Mild right maxillary sinus mucosal thickening. Frothy secretions within left ethmoid air cells. Soft tissues: No maxillofacial soft tissue swelling appreciable by CT. Other: Incidentally noted, there is periapical lucency surrounding the right mandibular first and second premolars IMPRESSION: CT head: 1. No evidence of acute intracranial abnormality. 2. Partially empty sella turcica. This finding is very commonly incidental, but can be associated with idiopathic intracranial hypertension.  CT maxillofacial: 1. No evidence of acute maxillofacial fracture. 2. Left ethmoid and maxillary sinusitis as described. 3. Mild right maxillary sinus mucosal thickening. 4. Incidentally noted periapical lucency surrounding the right mandibular first and second premolars. Electronically Signed   By: Kellie Simmering DO   On: 09/27/2020 14:59    Procedures Procedures (including critical care time)  Medications Ordered in ED Medications - No data to display  ED Course  I have reviewed the triage vital signs and the nursing notes.  Pertinent labs & imaging results that were available during my care of the patient were reviewed by me and considered in my medical decision making (see chart for details).    MDM Rules/Calculators/A&P                          54 year old female presenting to the ED after injury that occurred prior to arrival.  She was helping her husband who was on a ladder when the ladder fell and hit her between her eyes.  She denies any loss of consciousness.  She did have dizziness and blurry vision after the incident.  After speaking to her PCP on the phone she was told to come to the ER for CT scan.  States that her symptoms have gradually improved.  She does have pain in the area And admits for the past 10 days she has had sinus pressure and congestion.  She denies any anticoagulant use, numbness in arms or legs, neck pain, changes to gait or fever.  On exam there is some tenderness of the maxillary sinuses bilaterally.  No ecchymosis or edema noted.  No neurological deficits noted.  She remains ambulatory.  CT of the head is negative, CT maxillofacial shows findings consistent with sinusitis.  As she is symptomatic for 10 days will treat with antibiotics.  In the meantime we will also give her head injury precautions. There are no headache characteristics that are  lateralizing or concerning for increased ICP, infectious or vascular cause of her symptoms. Patient was counseled on head  injury precautions and symptoms that should indicate their return to the ED.  These include severe worsening headache, vision changes, confusion, loss of consciousness, trouble walking, nausea & vomiting, or weakness/tingling in extremities.  Patient is agreeable to the plan.  Return precautions given.  All imaging, if done today, including plain films, CT scans, and ultrasounds, independently reviewed by me, and interpretations confirmed via formal radiology reads.  Patient is hemodynamically stable, in NAD, and able to ambulate in the ED. Evaluation does not show pathology that would require ongoing emergent intervention or inpatient treatment. I explained the diagnosis to the patient. Pain has been managed and has no complaints prior to discharge. Patient is comfortable with above plan and is stable for discharge at this time. All questions were answered prior to disposition. Strict return precautions for returning to the ED were discussed. Encouraged follow up with PCP.   An After Visit Summary was printed and given to the patient.   Portions of this note were generated with Lobbyist. Dictation errors may occur despite best attempts at proofreading.  Final Clinical Impression(s) / ED Diagnoses Final diagnoses:  Injury of head, initial encounter  Contusion of face, initial encounter  Acute non-recurrent maxillary sinusitis    Rx / DC Orders ED Discharge Orders         Ordered    clindamycin (CLEOCIN) 300 MG capsule  Every 6 hours        09/27/20 1614           Delia Heady, PA-C 09/27/20 1619    Lucrezia Starch, MD 09/28/20 1223

## 2020-09-27 NOTE — Telephone Encounter (Signed)
[facility] - Client TELEPHONE ADVICE RECORD [facility] Patient Name: Yvonne Stephenson Gender: Female DOB: April 07, 1966 Age: 54 Y 10 M 27 D Return Phone Number: [phone] (Primary), [phone] (Secondary) Address: City/State/Zip: [place] [address] Client [facility] - Client Client Site [facility] Day Physician Viviana Simpler- MD Contact Type Call Who Is Calling Patient / Member / Family / Caregiver Call Type Triage / Clinical Relationship To Patient Self Return Phone Number [phone] (Primary) Chief Complaint Face Injury Reason for Call Symptomatic / Request for Health Information Initial Comment Caller states c/o hit across nose with a hammer that fell, bruising under eyes and has nose swelling and blurry vision that is getting better. Office has no openiings today and recommened that she needs to go to the ER and they want the triage nurse to tell her to get a CT scan. [place] Translation No Nurse Assessment Nurse: Raenette Rover, RN, Zella Ball Date/Time Eilene Ghazi Time): 09/27/2020 12:12:19 PM Confirm and document reason for call. If symptomatic, describe symptoms. ---She and husband were hanging something with a ladder and hammer. Went to move the ladder and didnt realize the hammer was sitting on top and it fell on her nose. Hx of deviated septum. Does the patient have any new or worsening symptoms? ---Yes Will a triage be completed? ---Yes Related visit to physician within the last 2 weeks? ---No Does the PT have any chronic conditions? (i.e. diabetes, asthma, this includes High risk factors for pregnancy, etc.) ---Yes List chronic conditions. ---Asthma, htn Is the patient pregnant or possibly pregnant? (Ask all females between the ages of 52-55) ---No Is this a behavioral health or substance abuse call? ---No Guidelines Guideline Title Affirmed Question  Affirmed Notes Nurse Date/Time (Eastern Time) Head Injury [1] Loss of vision or double vision AND [2] present now [place], [place] 09/27/2020 12:14:09 PM Disp. Time Eilene Ghazi Time) Disposition Final User PLEASE NOTE: All timestamps contained within this report are represented as [place] Standard Time. CONFIDENTIALTY NOTICE: This fax transmission is intended only for the addressee. It contains information that is legally privileged, confidential or otherwise protected from use or disclosure. If you are not the intended recipient, you are strictly prohibited from reviewing, disclosing, copying using or disseminating any of this information or taking any action in reliance on or regarding this information. If you have received this fax in error, please notify us immediately by telephone so that we can arrange for its return to [place]. Phone: [phone], Toll-Free: [phone], Fax: [phone] Page: 2 of 2 Call Id: [id number] 09/27/2020 12:17:38 PM Go to ED Now Yes Raenette Rover, RN, Herbert Deaner Disagree/Comply Comply Caller Understands Yes PreDisposition Did not know what to do Care Advice Given Per Guideline GO TO ED NOW: * Leave now. Drive carefully. ANOTHER ADULT SHOULD DRIVE: CARE ADVICE given per Head Injury (Adult) guideline. Comments User: Wilson Singer, RN Date/Time Eilene Ghazi Time): 09/27/2020 12:19:42 PM Blurry vision and everything looks foggy even with eh glasses on and bruising is already setting in on bridge of nose and under eyes. Referrals GO TO FACILITY OTHER - SPECIFY

## 2020-09-28 NOTE — Telephone Encounter (Signed)
Will await the ER evaluation. Please check on her tomorrow assuming she is not admitted

## 2020-09-28 NOTE — Telephone Encounter (Signed)
This was yesterday. ER was yesterday. All notes should be in the system. I had to leave a message for the pt to see how she was doing.

## 2020-09-28 NOTE — Telephone Encounter (Signed)
Pt called in returning shannon call and she stated she got a headache from reading a lot.

## 2020-09-28 NOTE — Telephone Encounter (Signed)
Please let her know that even with the reassuring CT scan, she could still have a minor concussion. She should avoid overdoing any mental activity like reading, doing bills, etc. She can do what she tolerates, and then stop if starts to bother her. Generally you just take it slow and slowly increase what you do

## 2020-09-28 NOTE — Telephone Encounter (Signed)
Spoke to pt. Advised her what Dr Silvio Pate said. Advised her of brain rest as she can.

## 2020-10-11 ENCOUNTER — Other Ambulatory Visit: Payer: Self-pay | Admitting: Internal Medicine

## 2020-10-17 ENCOUNTER — Other Ambulatory Visit: Payer: Self-pay

## 2020-10-17 ENCOUNTER — Ambulatory Visit
Admission: RE | Admit: 2020-10-17 | Discharge: 2020-10-17 | Disposition: A | Discharge status: Home / Self Care | Payer: Managed Care, Other (non HMO) | Source: Ambulatory Visit | Attending: Internal Medicine | Admitting: Internal Medicine

## 2020-10-17 DIAGNOSIS — Z1231 Encounter for screening mammogram for malignant neoplasm of breast: Secondary | ICD-10-CM

## 2020-10-18 ENCOUNTER — Ambulatory Visit (INDEPENDENT_AMBULATORY_CARE_PROVIDER_SITE_OTHER): Payer: Managed Care, Other (non HMO) | Admitting: Internal Medicine

## 2020-10-18 ENCOUNTER — Encounter: Payer: Self-pay | Admitting: Internal Medicine

## 2020-10-18 VITALS — BP 118/76 | HR 64 | Temp 97.7°F | Ht 59.5 in | Wt 169.0 lb

## 2020-10-18 DIAGNOSIS — I10 Essential (primary) hypertension: Secondary | ICD-10-CM | POA: Diagnosis not present

## 2020-10-18 DIAGNOSIS — K219 Gastro-esophageal reflux disease without esophagitis: Secondary | ICD-10-CM

## 2020-10-18 DIAGNOSIS — Z0000 Encounter for general adult medical examination without abnormal findings: Secondary | ICD-10-CM

## 2020-10-18 DIAGNOSIS — J4520 Mild intermittent asthma, uncomplicated: Secondary | ICD-10-CM | POA: Diagnosis not present

## 2020-10-18 DIAGNOSIS — Z1211 Encounter for screening for malignant neoplasm of colon: Secondary | ICD-10-CM

## 2020-10-18 LAB — CBC
HCT: 45.3 % (ref 36.0–46.0)
Hemoglobin: 15 g/dL (ref 12.0–15.0)
MCHC: 33.2 g/dL (ref 30.0–36.0)
MCV: 81.8 fl (ref 78.0–100.0)
Platelets: 139 10*3/uL — ABNORMAL LOW (ref 150.0–400.0)
RBC: 5.54 Mil/uL — ABNORMAL HIGH (ref 3.87–5.11)
RDW: 14.4 % (ref 11.5–15.5)
WBC: 6.8 10*3/uL (ref 4.0–10.5)

## 2020-10-18 LAB — LIPID PANEL
Cholesterol: 195 mg/dL (ref 0–200)
HDL: 50.9 mg/dL (ref >39.00)
LDL Cholesterol: 118 mg/dL — ABNORMAL HIGH (ref 0–99)
NonHDL: 143.86
Total CHOL/HDL Ratio: 4
Triglycerides: 128 mg/dL (ref 0.0–149.0)
VLDL: 25.6 mg/dL (ref 0.0–40.0)

## 2020-10-18 LAB — COMPREHENSIVE METABOLIC PANEL
ALT: 37 U/L — ABNORMAL HIGH (ref 0–35)
AST: 29 U/L (ref 0–37)
Albumin: 4.6 g/dL (ref 3.5–5.2)
Alkaline Phosphatase: 73 U/L (ref 39–117)
BUN: 14 mg/dL (ref 6–23)
CO2: 31 mEq/L (ref 19–32)
Calcium: 10.2 mg/dL (ref 8.4–10.5)
Chloride: 101 mEq/L (ref 96–112)
Creatinine, Ser: 0.78 mg/dL (ref 0.40–1.20)
GFR: 85.78 mL/min (ref >60.00)
Glucose, Bld: 94 mg/dL (ref 70–99)
Potassium: 4.5 mEq/L (ref 3.5–5.1)
Sodium: 137 mEq/L (ref 135–145)
Total Bilirubin: 0.5 mg/dL (ref 0.2–1.2)
Total Protein: 7.8 g/dL (ref 6.0–8.3)

## 2020-10-18 MED ORDER — BISOPROLOL FUMARATE 5 MG PO TABS
5.0000 mg | ORAL_TABLET | Freq: Every day | ORAL | 3 refills | Status: DC
Start: 2020-10-18 — End: 2021-10-22

## 2020-10-18 NOTE — Progress Notes (Signed)
Subjective:    Patient ID: Yvonne Stephenson, female    DOB: Mar 27, 1966, 54 y.o.   MRN: [id number]  HPI Here for physical This visit occurred during the SARS-CoV-2 public health emergency.  Safety protocols were in place, including screening questions prior to the visit, additional usage of staff PPE, and extensive cleaning of exam room while observing appropriate contact time as indicated for disinfecting solutions.   Doing fine Lost 40# from height a few months ago Reflux is better and the IBS Sucralfate is only in AM  Some exercise--not much Better eating habits  Current Outpatient Medications on File Prior to Visit  Medication Sig Dispense Refill  . albuterol (VENTOLIN HFA) 108 (90 Base) MCG/ACT inhaler INHALE 2 PUFFS BY MOUTH EVERY 6 HOURS AS NEEDED FOR WHEEZE OR SHORTNESS OF BREATH 18 each 0  . aspirin 81 MG chewable tablet Chew 81 mg by mouth daily.    . bisoprolol (ZEBETA) 5 MG tablet TAKE 1 TABLET BY MOUTH EVERY DAY 30 tablet 0  . LORazepam (ATIVAN) 0.5 MG tablet Take 1 tablet (0.5 mg total) by mouth 2 (two) times daily as needed for anxiety. 30 tablet 0  . Multiple Vitamins-Minerals (AIRBORNE PO) Take 1 tablet by mouth every Monday, Tuesday, Wednesday, Thursday, and Friday.    [name] omeprazole (PRILOSEC) 20 MG capsule TAKE 1 CAPSULE BY MOUTH EVERY DAY 90 capsule 0  . QVAR 40 MCG/ACT inhaler INHALE 2 PUFFS INTO THE LUNGS 2 (TWO) TIMES DAILY. 8.7 g 3  . sucralfate (CARAFATE) 1 GM/10ML suspension TAKE 10 MLS (1 G TOTAL) BY MOUTH 2 (TWO) TIMES DAILY. 1680 mL 1  . vitamin B-12 (CYANOCOBALAMIN) 1000 MCG tablet Take 1,000 mcg by mouth daily.     No current facility-administered medications on file prior to visit.    Allergies  Allergen Reactions  . Penicillins Shortness Of Breath    Has patient had a PCN reaction causing immediate rash, facial/tongue/throat swelling, SOB or lightheadedness with hypotension: Yes Has patient had a PCN reaction causing severe rash involving mucus  membranes or skin necrosis: No Has patient had a PCN reaction that required hospitalization: No Has patient had a PCN reaction occurring within the last 10 years: No If all of the above answers are "NO", then may proceed with Cephalosporin use.   REACTION: difficulty breathing  . Shellfish Allergy Shortness Of Breath  . Amoxicillin-Pot Clavulanate Swelling    Tongue swelling  . Atorvastatin     REACTION: hip pain  . Cefprozil     REACTION: rash  . Demerol Nausea And Vomiting  . Dilaudid [Hydromorphone Hcl] Nausea And Vomiting  . Doxycycline Other (See Comments)    Severe Abdominal Pain  . Meloxicam [Meloxicam] Swelling    Face swelling and lip tingling  . Zithromax [Azithromycin]     REACTION: tongue felt funny    Past Medical History:  Diagnosis Date  . Anxiety   . Asthma    only when gets a cold-expired inhaler  . GERD (gastroesophageal reflux disease)   . Hyperlipidemia   . Hypertension   . PONV (postoperative nausea and vomiting)     Past Surgical History:  Procedure Laterality Date  . ABDOMINAL HYSTERECTOMY  07-2006  . BREAST BIOPSY Left   . BREAST EXCISIONAL BIOPSY Right   . BREAST SURGERY  2004   LUMPECTOMY  . CARPAL TUNNEL RELEASE  4-10   LEFT (Dr. Tommie Raymond)  . CESAREAN SECTION     3  . LAPAROSCOPY  04/09/2012   Procedure:  LAPAROSCOPY OPERATIVE;  Surgeon: Donnamae Jude, MD;  Location: [facility];  Service: Gynecology;  Laterality: N/A;  . NASAL STENOSIS REPAIR  2003   . SALPINGOOPHORECTOMY  04/09/2012   Procedure: SALPINGO OOPHERECTOMY;  Surgeon: Donnamae Jude, MD;  Location: [facility];  Service: Gynecology;  Laterality: Right;  . TUBAL LIGATION      Family History  Problem Relation Age of Onset  . Heart attack Mother   . Heart disease Mother   . Hypertension Mother   . Stroke Mother   . Lung cancer Mother        smoked  . Allergies Mother   . Heart attack Father   . Heart disease Father   . Hypertension Father   . Diabetes Maternal Uncle   . Cancer  Maternal Grandmother   . Heart failure Maternal Grandfather   . Heart failure Paternal Grandmother     Social History   Socioeconomic History  . Marital status: Married    Spouse name: Not on file  . Number of children: 3  . Years of education: Not on file  . Highest education level: Not on file  Occupational History  . Occupation: Optometrist    Comment: [employer], museum in [place]  . Occupation:    Tobacco Use  . Smoking status: Never Smoker  . Smokeless tobacco: Never Used  Substance and Sexual Activity  . Alcohol use: Yes    Comment: occasionaly  . Drug use: No  . Sexual activity: Never  Other Topics Concern  . Not on file  Social History Narrative   Does walk a little for exercise   Social Determinants of Health   Financial Resource Strain: Not on file  Food Insecurity: Not on file  Transportation Needs: Not on file  Physical Activity: Not on file  Stress: Not on file  Social Connections: Not on file  Intimate Partner Violence: Not on file   Review of Systems  Constitutional: Negative for fatigue.       Wears seat belt  HENT: Negative for dental problem, hearing loss and tinnitus.        Keeps up with dentist  Eyes: Negative for visual disturbance.       No diplopia or unilateral vision loss  Respiratory: Negative for cough, chest tightness, shortness of breath and wheezing.        Not using the qvar regularly (last with the COVID)  Cardiovascular: Negative for chest pain, palpitations and leg swelling.  Gastrointestinal: Negative for abdominal pain, blood in stool and constipation.  Endocrine: Negative for polydipsia and polyuria.  Genitourinary: Negative for dyspareunia, dysuria and hematuria.  Musculoskeletal: Positive for back pain. Negative for arthralgias and joint swelling.       See Ron Agee for her back  Skin:       Has dry itchy patch on both arms by shoulders---hasn't been able to get in with dermatologist  Allergic/Immunologic:  Negative for environmental allergies and immunocompromised state.  Neurological: Negative for dizziness, syncope and light-headedness.       Some headaches--but not recently  Hematological: Negative for adenopathy. Does not bruise/bleed easily.  Psychiatric/Behavioral: Negative for dysphoric mood.       Some sleep problems for 2-3 weeks Husband having some medical issues Rarely needs the lorazepam for nerves       Objective:   Physical Exam Constitutional:      Appearance: Normal appearance.  HENT:     Right Ear: Tympanic membrane, ear canal and external  ear normal.     Left Ear: Tympanic membrane, ear canal and external ear normal.     Mouth/Throat:     Pharynx: No oropharyngeal exudate or posterior oropharyngeal erythema.  Eyes:     Conjunctiva/sclera: Conjunctivae normal.     Pupils: Pupils are equal, round, and reactive to light.  Cardiovascular:     Rate and Rhythm: Normal rate and regular rhythm.     Pulses: Normal pulses.     Heart sounds: No murmur heard. No gallop.   Pulmonary:     Effort: Pulmonary effort is normal.     Breath sounds: Normal breath sounds. No wheezing or rales.  Abdominal:     Palpations: Abdomen is soft.     Tenderness: There is no abdominal tenderness.  Musculoskeletal:     Cervical back: Neck supple.     Right lower leg: No edema.     Left lower leg: No edema.  Lymphadenopathy:     Cervical: No cervical adenopathy.  Skin:    General: Skin is warm.     Comments: Non suspicious papules on anterior shoulders (right is scratched off). She will get routine check at dermatologist  Neurological:     General: No focal deficit present.     Mental Status: She is alert and oriented to person, place, and time.  Psychiatric:        Mood and Affect: Mood normal.        Behavior: Behavior normal.            Assessment & Plan:

## 2020-10-18 NOTE — Assessment & Plan Note (Signed)
Okay on bisoprolol

## 2020-10-18 NOTE — Patient Instructions (Signed)
Please try stopping the sucralfate

## 2020-10-18 NOTE — Assessment & Plan Note (Signed)
Healthy Overdue for colon--referral made Just had mammogram--report pending No pap due to hyster Has vastly improved lifestyle Had COVID booster Prefers no flu vaccine

## 2020-10-18 NOTE — Assessment & Plan Note (Signed)
Quiet now Using qvar only for respiratory infections

## 2020-10-18 NOTE — Assessment & Plan Note (Signed)
Quiet now Will have her try without the sucralfate

## 2020-11-06 ENCOUNTER — Ambulatory Visit (AMBULATORY_SURGERY_CENTER): Payer: Self-pay

## 2020-11-06 ENCOUNTER — Other Ambulatory Visit: Payer: Self-pay

## 2020-11-06 VITALS — Ht 59.25 in | Wt 171.0 lb

## 2020-11-06 DIAGNOSIS — Z1211 Encounter for screening for malignant neoplasm of colon: Secondary | ICD-10-CM

## 2020-11-06 NOTE — Progress Notes (Signed)
No egg or soy allergy known to patient  No issues with past sedation with any surgeries or procedures No intubation problems in the past  No FH of Malignant Hyperthermia No diet pills per patient No home 02 use per patient  No blood thinners per patient  Pt denies issues with constipation  No A fib or A flutter  EMMI video to pt or via [place] 19 guidelines implemented in [facility] today with Pt and RN  Pt is fully vaccinated  for Covid   Pt refused sutab, feared the pills would be too difficult to swallow,  miralax prep used instead.  Due to the COVID-19 pandemic we are asking patients to follow certain guidelines.  Pt aware of COVID protocols and [facility] guidelines

## 2020-11-15 ENCOUNTER — Telehealth: Payer: Self-pay | Admitting: Gastroenterology

## 2020-11-15 NOTE — Telephone Encounter (Signed)
Inbound call from patient.  She is allergic to cronethazin medication.

## 2020-11-15 NOTE — Telephone Encounter (Signed)
Spoke with the patient. The medication "cronethazin" is not coming up in the data base to add as an allergy. She will call them and try to find the correct name to add.

## 2020-11-16 ENCOUNTER — Encounter: Payer: Self-pay | Admitting: Gastroenterology

## 2020-11-20 ENCOUNTER — Telehealth: Payer: Self-pay | Admitting: Gastroenterology

## 2020-11-20 NOTE — Telephone Encounter (Signed)
Noted. Thanks.

## 2020-11-21 ENCOUNTER — Encounter: Payer: Managed Care, Other (non HMO) | Admitting: Gastroenterology

## 2020-12-04 ENCOUNTER — Other Ambulatory Visit: Payer: Self-pay | Admitting: Physical Medicine and Rehabilitation

## 2020-12-04 DIAGNOSIS — M7918 Myalgia, other site: Secondary | ICD-10-CM

## 2020-12-04 DIAGNOSIS — G8929 Other chronic pain: Secondary | ICD-10-CM

## 2020-12-04 DIAGNOSIS — M545 Low back pain, unspecified: Secondary | ICD-10-CM

## 2020-12-06 ENCOUNTER — Other Ambulatory Visit: Payer: Self-pay | Admitting: Internal Medicine

## 2020-12-22 ENCOUNTER — Ambulatory Visit: Payer: Managed Care, Other (non HMO) | Admitting: Family Medicine

## 2020-12-22 ENCOUNTER — Encounter: Payer: Self-pay | Admitting: Family Medicine

## 2020-12-22 ENCOUNTER — Other Ambulatory Visit: Payer: Self-pay

## 2020-12-22 DIAGNOSIS — H6502 Acute serous otitis media, left ear: Secondary | ICD-10-CM

## 2020-12-22 DIAGNOSIS — H6690 Otitis media, unspecified, unspecified ear: Secondary | ICD-10-CM | POA: Insufficient documentation

## 2020-12-22 MED ORDER — SULFAMETHOXAZOLE-TRIMETHOPRIM 800-160 MG PO TABS: 1.0000 | ORAL_TABLET | Freq: Two times a day (BID) | ORAL | 0 refills | Status: DC

## 2020-12-22 NOTE — Telephone Encounter (Signed)
Patient returned call and offered appointment for Monday due to no openings today. Pt stated she will go to [facility] due to not being able to sleep last night.  Sending to PCP as FYI.

## 2020-12-22 NOTE — Progress Notes (Signed)
Subjective:    Patient ID: Yvonne Stephenson, female    DOB: Aug 22, 1966, 55 y.o.   MRN: [id number]  This visit occurred during the SARS-CoV-2 public health emergency.  Safety protocols were in place, including screening questions prior to the visit, additional usage of staff PPE, and extensive cleaning of exam room while observing appropriate contact time as indicated for disinfecting solutions.    HPI 55 yo pt of Dr Silvio Pate presents with left ear pain  Bothering her for a week   Usually benadryl helps -not now  Has some otc ear drops  Balance is off if she turns her head Used some peroxide in it  Sharp pain , is constantly dull with intermittent sharp pain   No congestion or ST  Works with chemicals in a school to clean   Had a recent sinus infection   Patient Active Problem List   Diagnosis Date Noted  . Otitis media 12/22/2020  . Mild intermittent asthma 08/26/2016  . Asthma with acute exacerbation 08/12/2016  . Preventative health care 07/10/2012  . Fatty infiltration of liver 01/14/2012  . Hypertriglyceridemia 09/10/2007  . ANXIETY 09/10/2007  . Essential hypertension, benign 09/10/2007  . GERD 09/10/2007   Past Medical History:  Diagnosis Date  . Allergy    seasonal  . Anxiety   . Asthma    only when gets a cold-expired inhaler  . GERD (gastroesophageal reflux disease)   . Hyperlipidemia   . Hypertension   . PONV (postoperative nausea and vomiting)    Past Surgical History:  Procedure Laterality Date  . ABDOMINAL HYSTERECTOMY  07-2006  . BREAST BIOPSY Left   . BREAST EXCISIONAL BIOPSY Right   . BREAST SURGERY  2004   LUMPECTOMY  . CARPAL TUNNEL RELEASE  4-10   LEFT (Dr. Tommie Raymond)  . CESAREAN SECTION     3  . COLONOSCOPY  2010  . LAPAROSCOPY  04/09/2012   Procedure: LAPAROSCOPY OPERATIVE;  Surgeon: Donnamae Jude, MD;  Location: [facility];  Service: Gynecology;  Laterality: N/A;  . NASAL STENOSIS REPAIR  2003   . SALPINGOOPHORECTOMY  04/09/2012   Procedure:  SALPINGO OOPHERECTOMY;  Surgeon: Donnamae Jude, MD;  Location: [facility];  Service: Gynecology;  Laterality: Right;  . SHOULDER SURGERY Right 2016   spur on bone, states shoulder dislocated, [place]  . TUBAL LIGATION     Social History   Tobacco Use  . Smoking status: Never Smoker  . Smokeless tobacco: Never Used  Vaping Use  . Vaping Use: Never used  Substance Use Topics  . Alcohol use: Yes    Comment: occasionaly, every few months  . Drug use: No   Family History  Problem Relation Age of Onset  . Heart attack Mother   . Heart disease Mother   . Hypertension Mother   . Stroke Mother   . Lung cancer Mother        smoked  . Allergies Mother   . Heart attack Father   . Heart disease Father   . Hypertension Father   . Diabetes Maternal Uncle   . Cancer Maternal Grandmother   . Heart failure Maternal Grandfather   . Heart failure Paternal Grandmother   . Colon cancer Neg Hx   . Colon polyps Neg Hx   . Esophageal cancer Neg Hx   . Rectal cancer Neg Hx   . Stomach cancer Neg Hx    Allergies  Allergen Reactions  . Penicillins Shortness Of Breath  Has patient had a PCN reaction causing immediate rash, facial/tongue/throat swelling, SOB or lightheadedness with hypotension: Yes Has patient had a PCN reaction causing severe rash involving mucus membranes or skin necrosis: No Has patient had a PCN reaction that required hospitalization: No Has patient had a PCN reaction occurring within the last 10 years: No If all of the above answers are "NO", then may proceed with Cephalosporin use.   REACTION: difficulty breathing  . Shellfish Allergy Shortness Of Breath  . Amoxicillin-Pot Clavulanate Swelling    Tongue swelling  . Atorvastatin     REACTION: hip pain  . Cefprozil     REACTION: rash  . Demerol Nausea And Vomiting  . Dilaudid [Hydromorphone Hcl] Nausea And Vomiting  . Doxycycline Other (See Comments)    Severe Abdominal Pain  . Meloxicam [Meloxicam] Swelling    Face  swelling and lip tingling  . Zithromax [Azithromycin]     REACTION: tongue felt funny   Current Outpatient Medications on File Prior to Visit  Medication Sig Dispense Refill  . albuterol (VENTOLIN HFA) 108 (90 Base) MCG/ACT inhaler INHALE 2 PUFFS BY MOUTH EVERY 6 HOURS AS NEEDED FOR WHEEZE OR SHORTNESS OF BREATH 18 each 0  . aspirin 81 MG chewable tablet Chew 81 mg by mouth daily.    . bisoprolol (ZEBETA) 5 MG tablet Take 1 tablet (5 mg total) by mouth daily. 90 tablet 3  . LORazepam (ATIVAN) 0.5 MG tablet Take 1 tablet (0.5 mg total) by mouth 2 (two) times daily as needed for anxiety. 30 tablet 0  . Multiple Vitamins-Minerals (AIRBORNE PO) Take 1 tablet by mouth every Monday, Tuesday, Wednesday, Thursday, and Friday.    [name] QVAR 40 MCG/ACT inhaler INHALE 2 PUFFS INTO THE LUNGS 2 (TWO) TIMES DAILY. 8.7 g 3  . sucralfate (CARAFATE) 1 GM/10ML suspension TAKE 10 MLS (1 G TOTAL) BY MOUTH 2 (TWO) TIMES DAILY. 1680 mL 1  . vitamin B-12 (CYANOCOBALAMIN) 1000 MCG tablet Take 1,000 mcg by mouth daily.     No current facility-administered medications on file prior to visit.    Review of Systems  Constitutional: Negative for activity change, appetite change, fatigue, fever and unexpected weight change.  HENT: Positive for ear pain. Negative for congestion, ear discharge, facial swelling, hearing loss, postnasal drip, rhinorrhea, sinus pressure and sore throat.   Eyes: Negative for pain, redness and visual disturbance.  Respiratory: Negative for cough, shortness of breath and wheezing.   Cardiovascular: Negative for chest pain and palpitations.  Gastrointestinal: Negative for abdominal pain, blood in stool, constipation and diarrhea.  Endocrine: Negative for polydipsia and polyuria.  Genitourinary: Negative for dysuria, frequency and urgency.  Musculoskeletal: Negative for arthralgias, back pain and myalgias.  Skin: Negative for pallor and rash.  Allergic/Immunologic: Negative for environmental  allergies.  Neurological: Negative for dizziness, syncope and headaches.  Hematological: Negative for adenopathy. Does not bruise/bleed easily.  Psychiatric/Behavioral: Negative for decreased concentration and dysphoric mood. The patient is not nervous/anxious.        Objective:   Physical Exam Constitutional:      General: She is not in acute distress.    Appearance: Normal appearance. She is obese. She is not ill-appearing.  HENT:     Head: Normocephalic.     Right Ear: Tympanic membrane, ear canal and external ear normal. There is no impacted cerumen.     Left Ear: External ear normal. There is no impacted cerumen.     Ears:     Comments: L TM is  dull with effusion and erythema  No bulging     Nose: Rhinorrhea present.     Mouth/Throat:     Mouth: Mucous membranes are moist.     Pharynx: Oropharynx is clear. No oropharyngeal exudate or posterior oropharyngeal erythema.  Eyes:     General:        Right eye: No discharge.        Left eye: No discharge.     Conjunctiva/sclera: Conjunctivae normal.     Pupils: Pupils are equal, round, and reactive to light.  Cardiovascular:     Rate and Rhythm: Normal rate.  Pulmonary:     Effort: Pulmonary effort is normal. No respiratory distress.     Breath sounds: Normal breath sounds. No wheezing or rales.  Musculoskeletal:     Cervical back: Normal range of motion and neck supple.  Lymphadenopathy:     Cervical: No cervical adenopathy.  Skin:    General: Skin is dry.     Findings: No erythema or rash.  Neurological:     Mental Status: She is alert.     Cranial Nerves: No cranial nerve deficit.           Assessment & Plan:   Problem List Items Addressed This Visit      Nervous and Auditory   Otitis media    Mild early serous OM in L ear tx with bactrim DS (multiple drug allergies)  Analgesic prn/warm compress Inc freq of steroid ns to bid for one week and then resume qd (for ETD)   Update if not starting to improve in  a week or if worsening        Relevant Medications   sulfamethoxazole-trimethoprim (BACTRIM DS) 800-160 MG tablet

## 2020-12-22 NOTE — Telephone Encounter (Signed)
[facility] - Client Nonclinical Telephone Record [facility] Client [facility] - Client Client Site [facility] Physician Viviana Simpler- MD Contact Type Call Who Is Calling Patient / Member / Family / Caregiver Caller Name [facility] Phone Number [phone] Patient Name Yvonne Stephenson Patient DOB May 04, 1966 Call Type Message Only Information Provided Reason for Call Request to Schedule Office Appointment Initial Comment Caller states that she needs to make an appointment. Declined triage. Disp. Time Disposition Final User 12/22/2020 7:55:13 AM General Information Provided Yes Windy Canny Call Closed By: Windy Canny Transaction Date/Time: 12/22/2020 7:53:36 AM (ET)

## 2020-12-22 NOTE — Telephone Encounter (Signed)
I left a message on patient's voice mail to return my call.  Patient needs to schedule at our office or urgent care.

## 2020-12-22 NOTE — Telephone Encounter (Signed)
Noted! Thanks for the help!

## 2020-12-22 NOTE — Patient Instructions (Addendum)
Use a steroid nasal spray like flonase or nasacort twice daily for a week and then once daily after that   You can take tylenol or ibuprofen (if tolerated) for ear pain  Also a warm compress on your ear/face Take the bactrim Ds as directed   Update if not starting to improve in a week or if worsening

## 2020-12-22 NOTE — Telephone Encounter (Signed)
I called patient. Dr.Tower had a cancellation at 2:30 and I scheduled her with Dr.Tower.

## 2020-12-23 ENCOUNTER — Other Ambulatory Visit: Payer: Managed Care, Other (non HMO)

## 2020-12-23 NOTE — Assessment & Plan Note (Signed)
Mild early serous OM in L ear tx with bactrim DS (multiple drug allergies)  Analgesic prn/warm compress Inc freq of steroid ns to bid for one week and then resume qd (for ETD)   Update if not starting to improve in a week or if worsening

## 2020-12-24 ENCOUNTER — Other Ambulatory Visit: Payer: Self-pay | Admitting: Internal Medicine

## 2021-01-30 ENCOUNTER — Telehealth (INDEPENDENT_AMBULATORY_CARE_PROVIDER_SITE_OTHER): Payer: Managed Care, Other (non HMO) | Admitting: Internal Medicine

## 2021-01-30 ENCOUNTER — Encounter: Payer: Self-pay | Admitting: Internal Medicine

## 2021-01-30 DIAGNOSIS — J301 Allergic rhinitis due to pollen: Secondary | ICD-10-CM

## 2021-01-30 DIAGNOSIS — B9789 Other viral agents as the cause of diseases classified elsewhere: Secondary | ICD-10-CM | POA: Diagnosis not present

## 2021-01-30 DIAGNOSIS — J329 Chronic sinusitis, unspecified: Secondary | ICD-10-CM

## 2021-01-30 DIAGNOSIS — J4520 Mild intermittent asthma, uncomplicated: Secondary | ICD-10-CM

## 2021-01-30 MED ORDER — FLUTICASONE PROPIONATE 50 MCG/ACT NA SUSP: 2.0000 | Freq: Every day | NASAL | 1 refills | Status: DC

## 2021-01-30 NOTE — Progress Notes (Signed)
Virtual Visit via Video Note  I connected with Yvonne Stephenson on 01/30/21 at  9:15 AM EDT by a video enabled telemedicine application and verified that I am speaking with the correct person using two identifiers.  Location: Patient: Home Provider: Office  Person's participating in this video call: Webb Silversmith, NP-C and [employer].   I discussed the limitations of evaluation and management by telemedicine and the availability of in person appointments. The patient expressed understanding and agreed to proceed.  History of Present Illness:   Pt reports facial pain and pressure, nasal congestion, ear pain, sore throat.  This started 3 days ago.  She is blowing bloody mucus out of her nose.  She describes the ear pain as full and pressure.  She denies drainage or loss of hearing.  She denies difficulty swallowing.  She denies headache, runny nose, loss of taste/smell, cough or shortness of breath.  She denies fever, chills or body aches.  She does have a history of allergies and asthma.  She has been taking allergy medicine, Afrin and Ibuprofen OTC with minimal relief of symptoms.  She has not had sick contacts that she is aware of but she does work in a school.  She has not had known Covid exposure and she is fully vaccinated.  Past Medical History:  Diagnosis Date  . Allergy    seasonal  . Anxiety   . Asthma    only when gets a cold-expired inhaler  . GERD (gastroesophageal reflux disease)   . Hyperlipidemia   . Hypertension   . PONV (postoperative nausea and vomiting)     Current Outpatient Medications  Medication Sig Dispense Refill  . albuterol (VENTOLIN HFA) 108 (90 Base) MCG/ACT inhaler INHALE 2 PUFFS BY MOUTH EVERY 6 HOURS AS NEEDED FOR WHEEZE OR SHORTNESS OF BREATH 18 each 0  . aspirin 81 MG chewable tablet Chew 81 mg by mouth daily.    . bisoprolol (ZEBETA) 5 MG tablet Take 1 tablet (5 mg total) by mouth daily. 90 tablet 3  . LORazepam (ATIVAN) 0.5 MG tablet Take 1  tablet (0.5 mg total) by mouth 2 (two) times daily as needed for anxiety. 30 tablet 0  . Multiple Vitamins-Minerals (AIRBORNE PO) Take 1 tablet by mouth every Monday, Tuesday, Wednesday, Thursday, and Friday.    [name] QVAR 40 MCG/ACT inhaler INHALE 2 PUFFS INTO THE LUNGS 2 (TWO) TIMES DAILY. 8.7 g 3  . sucralfate (CARAFATE) 1 GM/10ML suspension TAKE 10 MLS (1 G TOTAL) BY MOUTH 2 (TWO) TIMES DAILY. 1680 mL 1  . sulfamethoxazole-trimethoprim (BACTRIM DS) 800-160 MG tablet Take 1 tablet by mouth 2 (two) times daily. 14 tablet 0  . vitamin B-12 (CYANOCOBALAMIN) 1000 MCG tablet Take 1,000 mcg by mouth daily.     No current facility-administered medications for this visit.    Allergies  Allergen Reactions  . Penicillins Shortness Of Breath    Has patient had a PCN reaction causing immediate rash, facial/tongue/throat swelling, SOB or lightheadedness with hypotension: Yes Has patient had a PCN reaction causing severe rash involving mucus membranes or skin necrosis: No Has patient had a PCN reaction that required hospitalization: No Has patient had a PCN reaction occurring within the last 10 years: No If all of the above answers are "NO", then may proceed with Cephalosporin use.   REACTION: difficulty breathing  . Shellfish Allergy Shortness Of Breath  . Amoxicillin-Pot Clavulanate Swelling    Tongue swelling  . Atorvastatin     REACTION: hip pain  .  Cefprozil     REACTION: rash  . Demerol Nausea And Vomiting  . Dilaudid [Hydromorphone Hcl] Nausea And Vomiting  . Doxycycline Other (See Comments)    Severe Abdominal Pain  . Meloxicam [Meloxicam] Swelling    Face swelling and lip tingling  . Zithromax [Azithromycin]     REACTION: tongue felt funny    Family History  Problem Relation Age of Onset  . Heart attack Mother   . Heart disease Mother   . Hypertension Mother   . Stroke Mother   . Lung cancer Mother        smoked  . Allergies Mother   . Heart attack Father   . Heart disease  Father   . Hypertension Father   . Diabetes Maternal Uncle   . Cancer Maternal Grandmother   . Heart failure Maternal Grandfather   . Heart failure Paternal Grandmother   . Colon cancer Neg Hx   . Colon polyps Neg Hx   . Esophageal cancer Neg Hx   . Rectal cancer Neg Hx   . Stomach cancer Neg Hx     Social History   Socioeconomic History  . Marital status: Married    Spouse name: Not on file  . Number of children: 3  . Years of education: Not on file  . Highest education level: Not on file  Occupational History  . Occupation: Optometrist    Comment: [employer] in [place]  . Occupation:    Tobacco Use  . Smoking status: Never Smoker  . Smokeless tobacco: Never Used  Vaping Use  . Vaping Use: Never used  Substance and Sexual Activity  . Alcohol use: Yes    Comment: occasionaly, every few months  . Drug use: No  . Sexual activity: Not on file  Other Topics Concern  . Not on file  Social History Narrative   Does walk a little for exercise   Social Determinants of Health   Financial Resource Strain: Not on file  Food Insecurity: Not on file  Transportation Needs: Not on file  Physical Activity: Not on file  Stress: Not on file  Social Connections: Not on file  Intimate Partner Violence: Not on file     Constitutional: Denies fever, malaise, fatigue, headache or abrupt weight changes.  HEENT: Patient reports facial pain and pressure, nasal congestion, ear pain and sore throat.  Denies eye pain, eye redness, ear pain, ringing in the ears, wax buildup, runny nose. Respiratory: Denies difficulty breathing, shortness of breath, cough or sputum production.   Cardiovascular: Denies chest pain, chest tightness, palpitations or swelling in the hands or feet.   No other specific complaints in a complete review of systems (except as listed in HPI above).  Observations/Objective: Wt Readings from Last 3 Encounters:  12/22/20 172 lb 7 oz (78.2 kg)   11/06/20 171 lb (77.6 kg)  10/18/20 169 lb (76.7 kg)    General: In NAD. HEENT: Nose: Congestion noted; Throat/Mouth: Slight hoarseness noted. Pulmonary/Chest: Normal effort. No respiratory distress.  Neurological: Alert and oriented.   BMET    Component Value Date/Time   NA 137 10/18/2020 1218   K 4.5 10/18/2020 1218   CL 101 10/18/2020 1218   CO2 31 10/18/2020 1218   GLUCOSE 94 10/18/2020 1218   BUN 14 10/18/2020 1218   CREATININE 0.78 10/18/2020 1218   CREATININE 0.74 11/19/2019 1609   CALCIUM 10.2 10/18/2020 [place] >60 09/17/2018 [place] >60 09/17/2018 0762  Lipid Panel     Component Value Date/Time   CHOL 195 10/18/2020 1218   TRIG 128.0 10/18/2020 1218   HDL 50.90 10/18/2020 1218   CHOLHDL 4 10/18/2020 1218   VLDL 25.6 10/18/2020 1218   LDLCALC 118 (H) 10/18/2020 1218    CBC    Component Value Date/Time   WBC 6.8 10/18/2020 1218   RBC 5.54 (H) 10/18/2020 1218   HGB 15.0 10/18/2020 1218   HCT 45.3 10/18/2020 1218   PLT 139.0 (L) 10/18/2020 1218   MCV 81.8 10/18/2020 1218   MCH 27.4 11/19/2019 1609   MCHC 33.2 10/18/2020 1218   RDW 14.4 10/18/2020 1218   LYMPHSABS 3,226 11/19/2019 1609   MONOABS 0.5 10/22/2013 1015   EOSABS 269 11/19/2019 1609   BASOSABS 48 11/19/2019 1609    Hgb A1C No results found for: HGBA1C      Assessment and Plan:  Viral Sinusitis:  No indication for abx at this time Offered RX for Pred Taper but she declines at this time Start Flonase 1 spray BID x 3 days then daily thereafter x 1 week Continue antihistamine Could consider oral abx on Friday if symptoms persist or worsen  Return precautions discussed Follow Up Instructions:    I discussed the assessment and treatment plan with the patient. The patient was provided an opportunity to ask questions and all were answered. The patient agreed with the plan and demonstrated an understanding of the instructions.   The patient was advised to call back  or seek an in-person evaluation if the symptoms worsen or if the condition fails to improve as anticipated.     Webb Silversmith, NP

## 2021-01-30 NOTE — Patient Instructions (Signed)

## 2021-01-31 ENCOUNTER — Encounter: Payer: Self-pay | Admitting: Internal Medicine

## 2021-02-01 MED ORDER — CLINDAMYCIN HCL 300 MG PO CAPS: 300.0000 mg | ORAL_CAPSULE | Freq: Three times a day (TID) | ORAL | 0 refills | Status: DC

## 2021-02-21 ENCOUNTER — Other Ambulatory Visit: Payer: Self-pay | Admitting: Internal Medicine

## 2021-03-20 ENCOUNTER — Other Ambulatory Visit: Payer: Self-pay | Admitting: Internal Medicine

## 2021-04-09 MED ORDER — EPINEPHRINE 0.3 MG/0.3ML IJ SOAJ: 0.3000 mg | INTRAMUSCULAR | 1 refills | Status: DC | PRN

## 2021-04-10 ENCOUNTER — Encounter: Payer: Managed Care, Other (non HMO) | Admitting: Gastroenterology

## 2021-04-15 ENCOUNTER — Other Ambulatory Visit: Payer: Self-pay | Admitting: Internal Medicine

## 2021-05-07 ENCOUNTER — Other Ambulatory Visit: Payer: Self-pay

## 2021-05-07 ENCOUNTER — Ambulatory Visit (AMBULATORY_SURGERY_CENTER): Payer: Managed Care, Other (non HMO) | Admitting: *Deleted

## 2021-05-07 VITALS — Ht 60.0 in | Wt 182.0 lb

## 2021-05-07 DIAGNOSIS — Z86010 Personal history of colonic polyps: Secondary | ICD-10-CM

## 2021-05-07 DIAGNOSIS — Z1211 Encounter for screening for malignant neoplasm of colon: Secondary | ICD-10-CM

## 2021-05-07 NOTE — Progress Notes (Signed)
No egg or soy allergy known to patient  No issues with past sedation with any surgeries or procedures Patient denies ever being told they had issues or difficulty with intubation  No FH of Malignant Hyperthermia No diet pills per patient No home 02 use per patient  No blood thinners per patient  Pt denies issues with constipation  No A fib or A flutter  EMMI video to pt or via [place] 19 guidelines implemented in [place] today with Pt and RN  Pt is fully vaccinated  for Covid   2010 pt had a TA polyp at colon at [facility]    Due to the COVID-19 pandemic we are asking patients to follow certain guidelines.  Pt aware of COVID protocols and [facility] guidelines

## 2021-05-21 ENCOUNTER — Ambulatory Visit: Payer: Managed Care, Other (non HMO) | Admitting: Podiatry

## 2021-05-21 ENCOUNTER — Other Ambulatory Visit: Payer: Self-pay

## 2021-05-21 ENCOUNTER — Encounter: Payer: Self-pay | Admitting: Gastroenterology

## 2021-05-21 ENCOUNTER — Ambulatory Visit (INDEPENDENT_AMBULATORY_CARE_PROVIDER_SITE_OTHER): Payer: Managed Care, Other (non HMO)

## 2021-05-21 ENCOUNTER — Ambulatory Visit (AMBULATORY_SURGERY_CENTER): Payer: Managed Care, Other (non HMO) | Admitting: Gastroenterology

## 2021-05-21 VITALS — BP 126/72 | HR 61 | Temp 97.7°F | Resp 12 | Ht 59.25 in | Wt 182.0 lb

## 2021-05-21 DIAGNOSIS — Z86010 Personal history of colonic polyps: Secondary | ICD-10-CM | POA: Diagnosis present

## 2021-05-21 DIAGNOSIS — M2041 Other hammer toe(s) (acquired), right foot: Secondary | ICD-10-CM

## 2021-05-21 DIAGNOSIS — Z1211 Encounter for screening for malignant neoplasm of colon: Secondary | ICD-10-CM

## 2021-05-21 MED ORDER — SODIUM CHLORIDE 0.9 % IV SOLN: 500.0000 mL | Freq: Once | INTRAVENOUS | Status: DC

## 2021-05-21 NOTE — Progress Notes (Signed)
pt tolerated well. VSS. awake and to recovery. Report given to RN.  

## 2021-05-21 NOTE — Progress Notes (Signed)
   HPI: 55 y.o. female presenting today as a new patient referral from Dr. Gershon Mussel, local podiatrist for evaluation of symptomatic hammertoes to the third and fifth digit of the right foot.  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  She has tried different conservative treatments including shoe gear modifications with minimal alleviation of symptoms for the patient.  She presents for surgical consult  Past Medical History:  Diagnosis Date   Allergy    seasonal   Anemia    Anxiety    Asthma    only when gets a cold-expired inhaler   GERD (gastroesophageal reflux disease)    Hyperlipidemia    past hx   Hypertension    PONV (postoperative nausea and vomiting)       Objective: Physical Exam General: The patient is alert and oriented x3 in no acute distress.  Dermatology: Skin is cool, dry and supple bilateral lower extremities. Negative for open lesions or macerations.  Vascular: Palpable pedal pulses bilaterally. No edema or erythema noted. Capillary refill within normal limits.  Neurological: Epicritic and protective threshold grossly intact bilaterally.   Musculoskeletal Exam: All pedal and ankle joints range of motion within normal limits bilateral. Muscle strength 5/5 in all groups bilateral. Hammertoe contracture deformity noted to digits #3 and 5 of the right foot.  Radiographic Exam: Hammertoe contracture deformity noted to the interphalangeal joints of the respective hammertoe digits mentioned on clinical musculoskeletal exam.  Dorsal bone spur formation overlying the DIPJ of the third digit right foot   Assessment: 1.  Hammertoes digits 3, 5 right foot   Plan of Care:  1. Patient evaluated. X-Rays reviewed.  2. Today we discussed the conservative versus surgical management of the presenting pathology. The patient opts for surgical management. All possible complications and details of the procedure were  explained. All patient questions were answered. No guarantees were expressed or implied. 3. Authorization for surgery was initiated today. Surgery will consist of DIPJ arthroplasty RT 3rd. PIPJ arthroplasty RT 5th.   *Patient is a Optometrist at [employer] *Husband's name is Ladd.     Edrick Kins, DPM [facility]  Dr. Edrick Kins, DPM    [address], [place] [address]                Office [phone]  Fax [phone]

## 2021-05-21 NOTE — Patient Instructions (Signed)
YOU HAD AN ENDOSCOPIC PROCEDURE TODAY AT THE [facility]:   Refer to the procedure report that was given to you for any specific questions about what was found during the examination.  If the procedure report does not answer your questions, please call your gastroenterologist to clarify.  If you requested that your care partner not be given the details of your procedure findings, then the procedure report has been included in a sealed envelope for you to review at your convenience later.  YOU SHOULD EXPECT: Some feelings of bloating in the abdomen. Passage of more gas than usual.  Walking can help get rid of the air that was put into your GI tract during the procedure and reduce the bloating. If you had a lower endoscopy (such as a colonoscopy or flexible sigmoidoscopy) you may notice spotting of blood in your stool or on the toilet paper. If you underwent a bowel prep for your procedure, you may not have a normal bowel movement for a few days.  Please Note:  You might notice some irritation and congestion in your nose or some drainage.  This is from the oxygen used during your procedure.  There is no need for concern and it should clear up in a day or so.  SYMPTOMS TO REPORT IMMEDIATELY:   Following lower endoscopy (colonoscopy or flexible sigmoidoscopy):  Excessive amounts of blood in the stool  Significant tenderness or worsening of abdominal pains  Swelling of the abdomen that is new, acute  Fever of 100F or higher   Following upper endoscopy (EGD)  Vomiting of blood or coffee ground material  New chest pain or pain under the shoulder blades  Painful or persistently difficult swallowing  New shortness of breath  Fever of 100F or higher  Black, tarry-looking stools  For urgent or emergent issues, a gastroenterologist can be reached at any hour by calling [phone]. Do not use MyChart messaging for urgent concerns.    DIET:  We do recommend a small meal at first, but  then you may proceed to your regular diet.  Drink plenty of fluids but you should avoid alcoholic beverages for 24 hours.  ACTIVITY:  You should plan to take it easy for the rest of today and you should NOT DRIVE or use heavy machinery until tomorrow (because of the sedation medicines used during the test).    FOLLOW UP: Our staff will call the number listed on your records 48-72 hours following your procedure to check on you and address any questions or concerns that you may have regarding the information given to you following your procedure. If we do not reach you, we will leave a message.  We will attempt to reach you two times.  During this call, we will ask if you have developed any symptoms of COVID 19. If you develop any symptoms (ie: fever, flu-like symptoms, shortness of breath, cough etc.) before then, please call [phone].  If you test positive for Covid 19 in the 2 weeks post procedure, please call and report this information to us.    If any biopsies were taken you will be contacted by phone or by letter within the next 1-3 weeks.  Please call us at [phone] if you have not heard about the biopsies in 3 weeks.    SIGNATURES/CONFIDENTIALITY: You and/or your care partner have signed paperwork which will be entered into your electronic medical record.  These signatures attest to the fact that that the information above on   your After Visit Summary has been reviewed and is understood.  Full responsibility of the confidentiality of this discharge information lies with you and/or your care-partner. 

## 2021-05-21 NOTE — Progress Notes (Signed)
Pt's states no medical or surgical changes since previsit or office visit. 

## 2021-05-21 NOTE — Op Note (Signed)
[facility] Patient Name: Yvonne Stephenson Procedure Date: 05/21/2021 11:01 AM MRN: [id number] Endoscopist: Thornton Park MD, MD Age: 55 Referring MD:  Date of Birth: 08-01-1966 Gender: Female Account #: [id number] Procedure:                Colonoscopy Indications:              Surveillance: Personal history of adenomatous                            polyps on last colonoscopy > 5 years ago                           Colonoscopy 04/07/2009 at [facility] revelaed a tubular                            adenoma, surveillance recommended in 5 years                           No known family history of colon cancer or polyps Medicines:                Monitored Anesthesia Care Procedure:                Pre-Anesthesia Assessment:                           - Prior to the procedure, a History and Physical                            was performed, and patient medications and                            allergies were reviewed. The patient's tolerance of                            previous anesthesia was also reviewed. The risks                            and benefits of the procedure and the sedation                            options and risks were discussed with the patient.                            All questions were answered, and informed consent                            was obtained. Prior Anticoagulants: The patient has                            taken no previous anticoagulant or antiplatelet                            agents. ASA Grade Assessment: III - A patient with  severe systemic disease. After reviewing the risks                            and benefits, the patient was deemed in                            satisfactory condition to undergo the procedure.                           After obtaining informed consent, the colonoscope                            was passed under direct vision. Throughout the                            procedure, the patient's  blood pressure, pulse, and                            oxygen saturations were monitored continuously. The                            CF [id number] was introduced through the anus                            and advanced to the 3 cm into the ileum. A second                            forward view of the right colon was performed. The                            colonoscopy was performed without difficulty. The                            patient tolerated the procedure well. The quality                            of the bowel preparation was good. The terminal                            ileum, ileocecal valve, appendiceal orifice, and                            rectum were photographed. Scope In: 11:09:34 AM Scope Out: 11:23:13 AM Scope Withdrawal Time: 0 hours 10 minutes 53 seconds  Total Procedure Duration: 0 hours 13 minutes 39 seconds  Findings:                 The perianal and digital rectal examinations were                            normal.                           Multiple small and large-mouthed diverticula were  found in the entire colon.                           The exam was otherwise without abnormality on                            direct and retroflexion views. Complications:            No immediate complications. Estimated Blood Loss:     Estimated blood loss: none. Impression:               - Diverticulosis in the entire examined colon.                           - The examination was otherwise normal on direct                            and retroflexion views.                           - No specimens collected. Recommendation:           - Patient has a contact number available for                            emergencies. The signs and symptoms of potential                            delayed complications were discussed with the                            patient. Return to normal activities tomorrow.                            Written  discharge instructions were provided to the                            patient.                           - High fiber diet recommended. Consider using a                            daily stool bulking agent such as psyllium or                            methylcellulose.                           - Continue present medications.                           - Repeat colonoscopy in 10 years for surveillance,                            earlier with new symptoms.                           -  Emerging evidence supports eating a diet of                            fruits, vegetables, grains, calcium, and yogurt                            while reducing red meat and alcohol may reduce the                            risk of colon cancer.                           - Thank you for allowing me to be involved in your                            colon cancer prevention. Thornton Park MD, MD 05/21/2021 11:28:37 AM This report has been signed electronically.

## 2021-05-23 ENCOUNTER — Telehealth: Payer: Self-pay | Admitting: *Deleted

## 2021-05-23 NOTE — Telephone Encounter (Signed)
First follow up call attempt.  LVM. 

## 2021-05-23 NOTE — Telephone Encounter (Signed)
  Follow up Call-  Call back number 05/21/2021  Post procedure Call Back phone  # [phone]  Permission to leave phone message Yes  Some recent data might be hidden     Patient questions:  Do you have a fever, pain , or abdominal swelling? No. Pain Score  0 *  Have you tolerated food without any problems? Yes.    Have you been able to return to your normal activities? Yes.    Do you have any questions about your discharge instructions: Diet   No. Medications  No. Follow up visit  No.  Do you have questions or concerns about your Care? No.  Actions: * If pain score is 4 or above: No action needed, pain <4.  Have you developed a fever since your procedure? no  2.   Have you had an respiratory symptoms (SOB or cough) since your procedure? no  3.   Have you tested positive for COVID 19 since your procedure no  4.   Have you had any family members/close contacts diagnosed with the COVID 19 since your procedure?  no   If yes to any of these questions please route to Joylene John, RN and Joella Prince, RN

## 2021-06-12 ENCOUNTER — Telehealth: Payer: Managed Care, Other (non HMO) | Admitting: Family Medicine

## 2021-06-12 DIAGNOSIS — J0140 Acute pansinusitis, unspecified: Secondary | ICD-10-CM

## 2021-06-12 DIAGNOSIS — J4521 Mild intermittent asthma with (acute) exacerbation: Secondary | ICD-10-CM | POA: Diagnosis not present

## 2021-06-12 MED ORDER — SPACER/AERO-HOLDING CHAMBERS DEVI
0 refills | Status: AC
Start: 2021-06-12 — End: ?

## 2021-06-12 MED ORDER — CLINDAMYCIN HCL 300 MG PO CAPS
300.0000 mg | ORAL_CAPSULE | Freq: Three times a day (TID) | ORAL | 0 refills | Status: AC
Start: 2021-06-12 — End: 2021-06-19

## 2021-06-12 NOTE — Progress Notes (Signed)
Yvonne Stephenson, Yvonne Stephenson are scheduled for a virtual visit with your provider today.    Just as we do with appointments in the office, we must obtain your consent to participate.  Your consent will be active for this visit and any virtual visit you may have with one of our providers in the next 365 days.    If you have a MyChart account, I can also send a copy of this consent to you electronically.  All virtual visits are billed to your insurance company just like a traditional visit in the office.  As this is a virtual visit, video technology does not allow for your provider to perform a traditional examination.  This may limit your provider's ability to fully assess your condition.  If your provider identifies any concerns that need to be evaluated in person or the need to arrange testing such as labs, EKG, etc, we will make arrangements to do so.    Although advances in technology are sophisticated, we cannot ensure that it will always work on either your end or our end.  If the connection with a video visit is poor, we may have to switch to a telephone visit.  With either a video or telephone visit, we are not always able to ensure that we have a secure connection.   I need to obtain your verbal consent now.   Are you willing to proceed with your visit today?   Yvonne Stephenson has provided verbal consent on 06/12/2021 for a virtual visit (video or telephone).   Yvonne Mayo, NP 06/12/2021  9:59 AM   Date:  06/12/2021   ID:  Yvonne Stephenson, DOB 10/07/1966, MRN [id number]  Patient Location: Home Provider Location: Home Office   Participants: Patient and Provider for Visit and Wrap up  Method of visit: Video  Location of Patient: Home Location of Provider: Home Office Consent was obtain for visit over the video. Services rendered by provider: Visit was performed via video  A video enabled telemedicine application was used and I verified that I am speaking with the correct person using two  identifiers.  PCP:  Venia Carbon, MD   Chief Complaint:    History of Present Illness:    Yvonne Stephenson is a 55 y.o. female with history as stated below. Presents video telehealth for an acute care visit dur to a cold. Started 4 days ago. Eye watering, ear pressure, sore throat, mild coughing- flaring up asthma "I have a lot of sinus pressure. When I blow my nose it is discolored. I did have a sore throat and it has gone away. I did a Covid test and it it negative- this morning"  Denies having fevers, chills, shortness of breath, chest pain, exposure to covid or other sick contacts. Modifying factors include: flonase, inhaler. No other aggravating or relieving factors.  No other c/o.   Past Medical, Surgical, Social History, Allergies, and Medications have been Reviewed.  Past Medical History:  Diagnosis Date   Allergy    seasonal   Anemia    Anxiety    Asthma    only when gets a cold-expired inhaler   GERD (gastroesophageal reflux disease)    Hyperlipidemia    past hx   Hypertension    PONV (postoperative nausea and vomiting)     No outpatient medications have been marked as taking for the 06/12/21 encounter (Appointment) with Gregory.     Allergies:   Penicillins, Shellfish allergy, Amoxicillin-pot clavulanate, Atorvastatin, Bee  venom, Cefprozil, Demerol, Dilaudid [hydromorphone hcl], Doxycycline, Meloxicam [meloxicam], and Zithromax [azithromycin]   ROS See HPI for history of present illness.  Physical Exam Constitutional:      Appearance: Normal appearance.  HENT:     Head: Normocephalic.     Nose:     Comments: Nasal tone noted  Eyes:     Conjunctiva/sclera: Conjunctivae normal.  Pulmonary:     Effort: Pulmonary effort is normal.     Comments: No shortness of breath, mild cough appreciated  Musculoskeletal:        General: Normal range of motion.     Cervical back: Normal range of motion.  Neurological:     Mental Status: She is  alert and oriented to person, place, and time.              A&P  1. Mild intermittent asthma with acute exacerbation Infection triggering asthma flare- spacer is not working needed new one  No refill of inhaler needed Encouraged to reach out to PCP if asthma worsens Patient acknowledged agreement and understanding of the plan.    - Spacer/Aero-Holding Dorise Bullion; Use as directed  Dispense: 1 Canister; Refill: 0  2. Acute non-recurrent pansinusitis S&S are consistent with allergy driven sinus infection Review of allergy medications and OTC meaures Provided on AVS as well Treatment for infection given- pt has several allergies Clind is helpful to her when she has sinus infections in past. Denies upset stomach with it.  Reviewed side effects, risks and benefits of medication.   Patient acknowledged agreement and understanding of the plan.    - clindamycin (CLEOCIN) 300 MG capsule; Take 1 capsule (300 mg total) by mouth 3 (three) times daily for 7 days.  Dispense: 21 capsule; Refill: 0  I discussed the assessment and treatment plan with the patient. The patient was provided an opportunity to ask questions and all were answered. The patient agreed with the plan and demonstrated an understanding of the instructions.   The patient was advised to call back or seek an in-person evaluation if the symptoms worsen or if the condition fails to improve as anticipated.   The above assessment and management plan was discussed with the patient. The patient verbalized understanding of and has agreed to the management plan. Patient is aware to call the clinic if symptoms persist or worsen. Patient is aware when to return to the clinic for a follow-up visit. Patient educated on when it is appropriate to go to the emergency department.   Time:   Today, I have spent 15 minutes with the patient with telehealth technology discussing the above problems, reviewing the chart, previous notes, medications  and orders.   Medication Changes: No orders of the defined types were placed in this encounter.    Disposition:  Follow up PRN Signed, Yvonne Mayo, NP  06/12/2021 9:59 AM

## 2021-06-12 NOTE — Patient Instructions (Addendum)
I appreciate the opportunity to provide you with care for your health and wellness.  I hope you feel better soon!  Please continue to practice social distancing to keep you, your family, and our community safe.  If you must go out, please wear a mask and practice good handwashing.  Have a wonderful day. With Gratitude, Cherly Beach, DNP, AGNP-BC  Allergies can cause a lot of symptoms: watery, itching eyes, runny nose (clear), sneezing, sinus pressure, and headaches. This is not making you contagious to others. You can not spread to others or catch this from others. These symptoms happen after you have been exposed to something that you are allergic to an allergen. Prevention: The best prevention is to avoid the things that you know you are allergic to, for example smoke (cigarette, cigar, wood); pollens and molds; animal dander; dust mites. And indoor inhalants such as cleaning products or aerosol sprays.  Target your bedroom as allergy free by removing carpets, damp mopping floors weekly, hanging washable curtains instead of blinds, removing books and stuffed animals, using foam pillows, and encasing pillows and mattress in plastic. Do not blow your nose too frequently or too hard. It may cause your eardrum to perforate (tear). Blow through both nostrils at the same time to equalize pressure.  Use tissue when you blow your nose. Dispose of them and then wash your hands. If no tissue is available, do the "elbow sneeze" into the bend of your arm (away from your open hands). Always wash your hands.  If able use the [facility] in the house and car to reduce exposure to pollens. Use an air filtration system in your house or buy a small one for your bedroom. Dust your house often, using a cloth and cleaner or polish that keeps the dust from flying into the air. Allergy testing can be done if you have had allergies for a long time and are not doing well on current treatments. Take your medications as directed. If  you find the medications are not working let your healthcare provider know. It might take more than one medication to control allergies, especially, seasonal ones.     Clindamycin Capsules What is this medication? CLINDAMYCIN ([place] sin) treats infections caused by bacteria. It belongs to a group of medications called antibiotics. It will not treat colds, the flu,or infections caused by viruses. This medicine may be used for other purposes; ask your health care provider orpharmacist if you have questions. COMMON BRAND NAME(S): Cleocin What should I tell my care team before I take this medication? They need to know if you have any of these conditions: Kidney disease Liver disease Stomach problems like colitis An unusual or allergic reaction to clindamycin, lincomycin, or other medications, foods, dyes like tartrazine or preservatives Pregnant or trying to get pregnant Breast-feeding How should I use this medication? Take this medication by mouth with a full glass of water. Follow the directions on the prescription label. You can take this medication with food or on an empty stomach. If the medication upsets your stomach, take it with food. Take your medication at regular intervals. Do not take your medication more often than directed. Take all of your medication as directed even if you think youare better. Do not skip doses or stop your medication early. Talk to your care team about the use of this medication in children. Specialcare may be needed. Overdosage: If you think you have taken too much of this medicine contact apoison control center or  emergency room at once. NOTE: This medicine is only for you. Do not share this medicine with others. What if I miss a dose? If you miss a dose, take it as soon as you can. If it is almost time for yournext dose, take only that dose. Do not take double or extra doses. What may interact with this medication? Birth control pills Medications that  relax muscles for surgery Rifampin This list may not describe all possible interactions. Give your health care provider a list of all the medicines, herbs, non-prescription drugs, or dietary supplements you use. Also tell them if you smoke, drink alcohol, or use illegaldrugs. Some items may interact with your medicine. What should I watch for while using this medication? Tell your care team if your symptoms do not start to get better or if they getworse. This medication may cause serious skin reactions. They can happen weeks to months after starting the medication. Contact your care team right away if you notice fevers or flu-like symptoms with a rash. The rash may be red or purple and then turn into blisters or peeling of the skin. Or, you might notice a red rash with swelling of the face, lips or lymph nodes in your neck or under yourarms. Do not treat diarrhea with over the counter products. Contact your care team ifyou have diarrhea that lasts more than 2 days or if it is severe and watery. What side effects may I notice from receiving this medication? Side effects that you should report to your care team as soon as possible: Allergic reactions-skin rash, itching, hives, swelling of the face, lips, tongue, or throat Kidney injury-decrease in the amount of urine, swelling of the ankles, hands, or feet Rash, fever, and swollen lymph nodes Redness, blistering, peeling, or loosening of the skin, including inside the mouth Severe diarrhea, fever Unusual vaginal discharge, itching, or odor Side effects that usually do not require medical attention (report to your careteam if they continue or are bothersome): Diarrhea Metallic taste in mouth Nausea Stomach pain Vomiting This list may not describe all possible side effects. Call your doctor for medical advice about side effects. You may report side effects to FDA at1-800-FDA-1088. Where should I keep my medication? Keep out of the reach of  children. Store at room temperature between 20 and 25 degrees C (68 and 77 degrees F).Throw away any unused medication after the expiration date. NOTE: This sheet is a summary. It may not cover all possible information. If you have questions about this medicine, talk to your doctor, pharmacist, orhealth care provider.  2022 Elsevier/Gold Standard (2020-12-01 13:11:03)

## 2021-06-13 ENCOUNTER — Telehealth: Payer: Self-pay | Admitting: Urology

## 2021-06-13 NOTE — Telephone Encounter (Signed)
DOS - 07/12/21  HAMMERTOE REPAIR 3,5 RIGHT --- [id number]   [employer] EFFECTIVE DATE - 06/28/16   PER [employer]IG[employer]'S AUTOMATIVE SYSTEM FOR CPT CODE 56433 X'S 2 NO PRIOR AUTH IS REQUIRED.   REF # [id number]

## 2021-07-12 ENCOUNTER — Other Ambulatory Visit: Payer: Self-pay | Admitting: Podiatry

## 2021-07-12 ENCOUNTER — Encounter: Payer: Self-pay | Admitting: Podiatry

## 2021-07-12 DIAGNOSIS — M2041 Other hammer toe(s) (acquired), right foot: Secondary | ICD-10-CM

## 2021-07-12 MED ORDER — ONDANSETRON HCL 4 MG PO TABS: 4.0000 mg | ORAL_TABLET | Freq: Three times a day (TID) | ORAL | 0 refills | Status: DC | PRN

## 2021-07-12 MED ORDER — OXYCODONE-ACETAMINOPHEN 5-325 MG PO TABS: 1.0000 | ORAL_TABLET | ORAL | 0 refills | Status: DC | PRN

## 2021-07-12 MED ORDER — IBUPROFEN 800 MG PO TABS: 800.0000 mg | ORAL_TABLET | Freq: Three times a day (TID) | ORAL | 1 refills | Status: DC

## 2021-07-12 NOTE — Progress Notes (Signed)
PRN postop 

## 2021-07-18 ENCOUNTER — Ambulatory Visit (INDEPENDENT_AMBULATORY_CARE_PROVIDER_SITE_OTHER): Payer: Managed Care, Other (non HMO)

## 2021-07-18 ENCOUNTER — Other Ambulatory Visit: Payer: Self-pay

## 2021-07-18 ENCOUNTER — Encounter: Payer: Self-pay | Admitting: Podiatry

## 2021-07-18 ENCOUNTER — Ambulatory Visit (INDEPENDENT_AMBULATORY_CARE_PROVIDER_SITE_OTHER): Payer: Managed Care, Other (non HMO) | Admitting: Podiatry

## 2021-07-18 DIAGNOSIS — Z9889 Other specified postprocedural states: Secondary | ICD-10-CM

## 2021-07-18 NOTE — Progress Notes (Signed)
   Subjective:  Patient presents today status post HT repair 3, 5 RT. DOS: 07/12/2021.  Patient states that she is doing very well.  She is kept the dressings clean dry and intact and weightbearing in the cam boot as instructed.  No new complaints at this time  Past Medical History:  Diagnosis Date   Allergy    seasonal   Anemia    Anxiety    Asthma    only when gets a cold-expired inhaler   GERD (gastroesophageal reflux disease)    Hyperlipidemia    past hx   Hypertension    PONV (postoperative nausea and vomiting)       Objective/Physical Exam Neurovascular status intact.  Skin incisions appear to be well coapted with sutures intact. No sign of infectious process noted. No dehiscence. No active bleeding noted. Moderate edema noted to the surgical extremity.  Radiographic Exam:  Oites appear to be stable with routine healing.  Assessment: 1. s/p hammertoe repair 3, 5 RT foot. DOS: 07/12/2021   Plan of Care:  1. Patient was evaluated. X-rays reviewed 2.  Dressings changed.  Patient may begin washing and showering and getting the foot wet 3.  DC cam boot.  Postsurgical shoe dispensed.  Weightbearing as tolerated. 4.  Note for work was provided to return to work rest as needed minimal weightbearing 5.  Return to clinic in 1 week for suture removal by another physician.  I will be out of town next week.  I will see the patient in 3 weeks from today   Edrick Kins, DPM [facility]  Dr. Edrick Kins, DPM    [address], [place] [address]                Office [phone]  Fax [phone]

## 2021-07-18 NOTE — Progress Notes (Signed)
Dg  

## 2021-07-26 ENCOUNTER — Other Ambulatory Visit: Payer: Self-pay

## 2021-07-26 ENCOUNTER — Encounter: Payer: Self-pay | Admitting: Podiatrist

## 2021-07-26 ENCOUNTER — Ambulatory Visit (INDEPENDENT_AMBULATORY_CARE_PROVIDER_SITE_OTHER): Payer: Managed Care, Other (non HMO) | Admitting: Podiatrist

## 2021-07-26 DIAGNOSIS — Z9889 Other specified postprocedural states: Secondary | ICD-10-CM

## 2021-07-26 DIAGNOSIS — M2041 Other hammer toe(s) (acquired), right foot: Secondary | ICD-10-CM

## 2021-07-26 NOTE — Progress Notes (Signed)
Chief Complaint  Patient presents with   Routine Post Op    POV #2 DOS 07/12/2021 HAMMERTOE REPAIR 3,5 RT     Subjective: Patient presents today2 weeks status post foot surgery of the right foot- hammertoe repair digits 3 and 5.    Patient denies nausea, vomiting, fevers, chills or night sweats.  Denies calf pain or tenderness to the operative side.  Overall states she is doing well.  No new complaints.   Objective:  Neurovascular status is intact with palpable pedal pulses DP and PT at 2+ out of 4 right foot.  Negative homans sign noted.  Neurological sensation is intact and unchanged as per prior to surgery. Excellent appearance of the postoperative foot is noted.  On digits 3 and 5 sutures are noted to be in place with incision site well coapted and well healed.   Minimal swelling present.    Assessment: Status post hammertoe repair digits 3 and 5 right foot  Plan:  all sutures are removed from the hammertoe correction on digits 3 and 5 right foot. Patient tolerated this well.  I released her to drive in a normal sneaker or supportive shoe.  She may also start to wean out of her sugrical shoe slowly as tolerated.  She is to wear a supportive shoe at all times.  She will return for follow up with Dr. Amalia Hailey in 3 weeks.  If any concerns arise prior to that visit she is to call.

## 2021-07-30 ENCOUNTER — Encounter: Payer: Managed Care, Other (non HMO) | Admitting: Podiatry

## 2021-08-13 ENCOUNTER — Ambulatory Visit (INDEPENDENT_AMBULATORY_CARE_PROVIDER_SITE_OTHER): Payer: Managed Care, Other (non HMO)

## 2021-08-13 ENCOUNTER — Ambulatory Visit (INDEPENDENT_AMBULATORY_CARE_PROVIDER_SITE_OTHER): Payer: Managed Care, Other (non HMO) | Admitting: Podiatry

## 2021-08-13 ENCOUNTER — Other Ambulatory Visit: Payer: Self-pay

## 2021-08-13 DIAGNOSIS — Z9889 Other specified postprocedural states: Secondary | ICD-10-CM | POA: Diagnosis not present

## 2021-08-13 NOTE — Progress Notes (Signed)
   Subjective:  Patient presents today status post HT repair 3, 5 RT. DOS: 07/12/2021.  Patient states that she is doing well.  She has been very active and on her feet a significant amount of time.  She continues to wear crocs daily due to the swelling.  Past Medical History:  Diagnosis Date   Allergy    seasonal   Anemia    Anxiety    Asthma    only when gets a cold-expired inhaler   GERD (gastroesophageal reflux disease)    Hyperlipidemia    past hx   Hypertension    PONV (postoperative nausea and vomiting)       Objective/Physical Exam Neurovascular status intact.  Skin incisions appear to be well coapted with sutures intact. No sign of infectious process noted. No dehiscence. No active bleeding noted.  Edema noted specifically to the third toe  Radiographic Exam:  Osteotomy sites appear to be stable with routine healing.  Assessment: 1. s/p hammertoe repair 3, 5 RT foot. DOS: 07/12/2021   Plan of Care:  1. Patient was evaluated. X-rays reviewed 2.  Patient admits to being on her foot significantly over the past few weeks.  I explained that this is likely the reason for the swelling of the toes.  Recommend that she rest her foot is much as possible 3.  Recommend good supportive shoes and sneakers that allow plenty of room in the toebox 4.  I explained to the patient that the swelling of the toes should be temporary and should resolve over the next few months depending on the patient's activity levels 5.  Return to clinic as needed   Edrick Kins, DPM [facility]  Dr. Edrick Kins, DPM    [address], [place] [address]                Office [phone]  Fax [phone]

## 2021-09-20 ENCOUNTER — Telehealth: Payer: Managed Care, Other (non HMO) | Admitting: Physician Assistant

## 2021-09-20 DIAGNOSIS — R0602 Shortness of breath: Secondary | ICD-10-CM

## 2021-09-20 NOTE — Progress Notes (Signed)
Based on what you shared with me, I feel your condition warrants further evaluation as soon as possible at an Emergency department.   If you are continuing to have symptoms despite these treatments, you should be evaluated ASAP in person for breathing treatments and possible imaging to rule out pneumonia.   NOTE: There will be NO CHARGE for this eVisit   If you are having a true medical emergency please call 911.      Emergency [facility]  Get Driving Directions  [phone]  [address]  [place], [place] [address]  Open 24/7/365      [facility] Emergency Department at [place]  [address]  [place], [place] [address]  Open 24/7/365    Emergency [facility]  Get Driving Directions  [phone]  [address], [place] [address]  Open 24/7/365      [facility] at [facility]  Get Driving Directions  [phone]  [address]  [place], [place] [address]  Open 24/7/365    [facility]  Emergency [place]  Get Driving Directions  [phone]  [place], [place] [address]  Open 24/7/365    [place]  Emergency Department- [place]  Get Driving Directions  [address]  [place], [place] [address]  Open 24/7/365    [facility]  Emergency [facility]  Get Driving Directions  [phone]  [address]  [place], [place] [address]  Open 24/7/365   I provided 5 minutes of non face-to-face time during this encounter for chart review and documentation.

## 2021-09-21 ENCOUNTER — Emergency Department (HOSPITAL_COMMUNITY): Payer: Managed Care, Other (non HMO)

## 2021-09-21 ENCOUNTER — Encounter: Payer: Self-pay | Admitting: Internal Medicine

## 2021-09-21 ENCOUNTER — Other Ambulatory Visit: Payer: Self-pay

## 2021-09-21 ENCOUNTER — Emergency Department (HOSPITAL_COMMUNITY)
Admission: EM | Admit: 2021-09-21 | Discharge: 2021-09-21 | Disposition: A | Discharge status: Home / Self Care | Payer: Managed Care, Other (non HMO) | Attending: Emergency Medicine | Admitting: Emergency Medicine

## 2021-09-21 ENCOUNTER — Encounter (HOSPITAL_COMMUNITY): Payer: Self-pay | Admitting: *Deleted

## 2021-09-21 DIAGNOSIS — I10 Essential (primary) hypertension: Secondary | ICD-10-CM | POA: Diagnosis not present

## 2021-09-21 DIAGNOSIS — J4520 Mild intermittent asthma, uncomplicated: Secondary | ICD-10-CM | POA: Insufficient documentation

## 2021-09-21 DIAGNOSIS — Z20822 Contact with and (suspected) exposure to covid-19: Secondary | ICD-10-CM | POA: Diagnosis not present

## 2021-09-21 DIAGNOSIS — R059 Cough, unspecified: Secondary | ICD-10-CM | POA: Diagnosis present

## 2021-09-21 DIAGNOSIS — J101 Influenza due to other identified influenza virus with other respiratory manifestations: Secondary | ICD-10-CM | POA: Insufficient documentation

## 2021-09-21 LAB — RESP PANEL BY RT-PCR (FLU A&B, COVID) ARPGX2
Influenza A by PCR: POSITIVE — AB
Influenza B by PCR: NEGATIVE
SARS Coronavirus 2 by RT PCR: NEGATIVE

## 2021-09-21 MED ORDER — DOXYCYCLINE HYCLATE 100 MG PO CAPS: 100.0000 mg | ORAL_CAPSULE | Freq: Two times a day (BID) | ORAL | 0 refills | Status: DC

## 2021-09-21 MED ORDER — IPRATROPIUM-ALBUTEROL 0.5-2.5 (3) MG/3ML IN SOLN
3.0000 mL | Freq: Once | RESPIRATORY_TRACT | Status: AC
  Administered 2021-09-21: 3 mL via RESPIRATORY_TRACT
  Filled 2021-09-21: qty 3

## 2021-09-21 MED ORDER — ONDANSETRON HCL 4 MG PO TABS: 4.0000 mg | ORAL_TABLET | Freq: Three times a day (TID) | ORAL | 0 refills | Status: DC | PRN

## 2021-09-21 NOTE — ED Provider Notes (Signed)
[facility] EMERGENCY DEPARTMENT Provider Note   CSN: [id number] Arrival date & time: 09/21/21  9417     History Chief Complaint  Patient presents with   Cough   Shortness of Breath    Yvonne Stephenson is a 55 y.o. female past medical history of asthma presenting today with a complaint of shortness of breath and cough since last weekend.  She reported that on Friday she was exposed to a large amount of chlorine which triggered her asthma.  Continued to be short of breath with a mild cough throughout the weekend.  On Tuesday her symptoms continued so she went to the urgent care.  Urgent care diagnosed her with an asthma exacerbation, gave her a nebulizer and discharged her with prednisone and instructions to use her nebulizer 3 times a day.  She has been doing this without relief.  COVID and flu testing at that time was negative. Denies any history of DVT, no recent travel, no recent surgery or OCP use.  Also endorsing congestion and feelings of a fever that came on this morning at 3 AM.  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   Past Medical History:  Diagnosis Date   Allergy    seasonal   Anemia    Anxiety    Asthma    only when gets a cold-expired inhaler   GERD (gastroesophageal reflux disease)    Hyperlipidemia    past hx   Hypertension    PONV (postoperative nausea and vomiting)     Patient Active Problem List   Diagnosis Date Noted   Otitis media 12/22/2020   Mild intermittent asthma 08/26/2016   Asthma with acute exacerbation 08/12/2016   Preventative health care 07/10/2012   Fatty infiltration of liver 01/14/2012   Hypertriglyceridemia 09/10/2007   ANXIETY 09/10/2007   Essential hypertension, benign 09/10/2007   GERD 09/10/2007    Past Surgical History:  Procedure Laterality Date   ABDOMINAL HYSTERECTOMY  07/2006   BREAST BIOPSY Left    BREAST EXCISIONAL BIOPSY Right     BREAST SURGERY  2004   LUMPECTOMY   CARPAL TUNNEL RELEASE  01/2009   LEFT (Dr. Tommie Raymond)   CESAREAN SECTION     3   COLONOSCOPY  2010   LAPAROSCOPY  04/09/2012   Procedure: LAPAROSCOPY OPERATIVE;  Surgeon: Donnamae Jude, MD;  Location: [facility];  Service: Gynecology;  Laterality: N/A;   NASAL STENOSIS REPAIR  2003   POLYPECTOMY     TA 2010   SALPINGOOPHORECTOMY  04/09/2012   Procedure: SALPINGO OOPHERECTOMY;  Surgeon: Donnamae Jude, MD;  Location: [facility];  Service: Gynecology;  Laterality: Right;   SHOULDER SURGERY Right 2016   spur on bone, states shoulder dislocated, [place]   TUBAL LIGATION       OB History     Gravida  4   Para  3   Term      Preterm      AB  1   Living  3      SAB  1   IAB      Ectopic      Multiple      Live Births              Family History  Problem Relation Age of Onset   Heart attack Mother    Heart disease Mother    Hypertension Mother    Stroke  Mother    Lung cancer Mother        smoked   Allergies Mother    Heart attack Father    Heart disease Father    Hypertension Father    Diabetes Maternal Uncle    Cancer Maternal Grandmother    Heart failure Maternal Grandfather    Heart failure Paternal Grandmother    Colon cancer Neg Hx    Colon polyps Neg Hx    Esophageal cancer Neg Hx    Rectal cancer Neg Hx    Stomach cancer Neg Hx     Social History   Tobacco Use   Smoking status: Never   Smokeless tobacco: Never  Vaping Use   Vaping Use: Never used  Substance Use Topics   Alcohol use: Yes    Comment: occasionaly, every few months   Drug use: No    Home Medications Prior to Admission medications   Medication Sig Start Date End Date Taking? Authorizing Provider  albuterol (VENTOLIN HFA) 108 (90 Base) MCG/ACT inhaler INHALE 2 PUFFS BY MOUTH EVERY 6 HOURS AS NEEDED FOR WHEEZE OR SHORTNESS OF BREATH 12/06/20   Viviana Simpler I, MD  bisoprolol (ZEBETA) 5 MG tablet Take 1 tablet (5 mg total) by mouth daily.  10/18/20   Viviana Simpler I, MD  EPINEPHrine 0.3 mg/0.3 mL IJ SOAJ injection Inject 0.3 mg into the muscle as needed for anaphylaxis. 04/09/21   Venia Carbon, MD  fluticasone (FLONASE) 50 MCG/ACT nasal spray SPRAY 2 SPRAYS INTO EACH NOSTRIL EVERY DAY 04/16/21   Venia Carbon, MD  ibuprofen (ADVIL) 800 MG tablet Take 1 tablet (800 mg total) by mouth 3 (three) times daily. 07/12/21   Edrick Kins, DPM  LORazepam (ATIVAN) 0.5 MG tablet Take 1 tablet (0.5 mg total) by mouth 2 (two) times daily as needed for anxiety. 07/08/20   Venia Carbon, MD  Multiple Vitamins-Minerals (AIRBORNE PO) Take 1 tablet by mouth every Monday, Tuesday, Wednesday, Thursday, and Friday.    [provider]  omeprazole (PRILOSEC) 20 MG capsule Take 20 mg by mouth as needed. 03/18/21   [provider]  ondansetron (ZOFRAN) 4 MG tablet Take 1 tablet (4 mg total) by mouth every 8 (eight) hours as needed for nausea or vomiting. 07/12/21   Edrick Kins, DPM  oxyCODONE-acetaminophen (PERCOCET) 5-325 MG tablet Take 1 tablet by mouth every 4 (four) hours as needed for severe pain. 07/12/21   Edrick Kins, DPM  QVAR 40 MCG/ACT inhaler INHALE 2 PUFFS INTO THE LUNGS 2 (TWO) TIMES DAILY. 08/12/17   Venia Carbon, MD  Spacer/Aero-Holding Chambers DEVI Use as directed 06/12/21   Perlie Mayo, NP  sucralfate (CARAFATE) 1 GM/10ML suspension TAKE 10 MLS (1 G TOTAL) BY MOUTH 2 (TWO) TIMES DAILY. 08/26/20   Venia Carbon, MD  vitamin B-12 (CYANOCOBALAMIN) 1000 MCG tablet Take 1,000 mcg by mouth daily.    [provider]    Allergies    Penicillins, Shellfish allergy, Amoxicillin-pot clavulanate, Atorvastatin, Bee venom, Cefprozil, Demerol, Dilaudid [hydromorphone hcl], Doxycycline, Meloxicam [meloxicam], and Zithromax [azithromycin]  Review of Systems   Review of Systems  Constitutional:  Positive for fever.  HENT:  Positive for congestion. Negative for sore throat.   Respiratory:  Positive  for cough and shortness of breath.   Cardiovascular:  Positive for palpitations. Negative for chest pain.  Neurological:  Negative for dizziness and weakness.  All other systems reviewed and are negative.  Physical Exam Updated Vital Signs  BP (!) 142/61   Pulse 86   Temp 99 F (37.2 C)   Resp (!) 22   Ht 5' (1.524 m)   Wt 81.6 kg   SpO2 98%   BMI 35.15 kg/m   Physical Exam Vitals and nursing note reviewed.  Constitutional:      General: She is not in acute distress.    Appearance: Normal appearance. She is not ill-appearing.  HENT:     Head: Normocephalic and atraumatic.     Mouth/Throat:     Mouth: Mucous membranes are moist.     Pharynx: Oropharynx is clear.  Eyes:     General: No scleral icterus.    Conjunctiva/sclera: Conjunctivae normal.  Cardiovascular:     Rate and Rhythm: Normal rate and regular rhythm.  Pulmonary:     Effort: Pulmonary effort is normal. No respiratory distress.     Breath sounds: Examination of the right-middle field reveals wheezing. Wheezing present. No decreased breath sounds, rhonchi or rales.  Musculoskeletal:     Right lower leg: No edema.     Left lower leg: No edema.  Skin:    General: Skin is warm and dry.     Findings: No rash.  Neurological:     Mental Status: She is alert.  Psychiatric:        Mood and Affect: Mood normal.        Behavior: Behavior normal.    ED Results / Procedures / Treatments   Labs (all labs ordered are listed, but only abnormal results are displayed) Labs Reviewed  RESP PANEL BY RT-PCR (FLU A&B, COVID) [id number]    EKG NSR with normal rate.  Radiology DG Chest Portable 1 View  Result Date: 09/21/2021 CLINICAL DATA:  Shortness of breath since Saturday EXAM: PORTABLE CHEST 1 VIEW COMPARISON:  08/17/2016 FINDINGS: Normal heart size and mediastinal contours. No acute infiltrate or edema. No effusion or pneumothorax. No acute osseous findings. IMPRESSION: Negative portable chest. Electronically Signed    By: Jorje Guild M.D.   On: 09/21/2021 06:58    Procedures Procedures   Medications Ordered in ED Medications  ipratropium-albuterol (DUONEB) 0.5-2.5 (3) MG/3ML nebulizer solution 3 mL (has no administration in time range)    ED Course  I have reviewed the triage vital signs and the nursing notes.  Pertinent labs & imaging results that were available during my care of the patient were reviewed by me and considered in my medical decision making (see chart for details).  Clinical Course as of 09/21/21 2025  Fri Sep 21, 2021  0721 Resp Panel by RT-PCR (Flu A&B, Covid) Nasopharyngeal Swab [MR]    Clinical Course User Index [MR] Cristie Mckinney, Cecilio Asper, PA-C   MDM Rules/Calculators/A&P The emergent differential diagnosis for shortness of breath includes, but is not limited to, Pulmonary edema, bronchoconstriction, Pneumonia, Pulmonary embolism, Pneumotherax/ Hemothorax, Dysrythmia, ACS.  All of these considered throughout the evaluation of this patient.   Initial chest x-ray negative.  EKG in normal sinus with a regular rate.  Her last nebulizer treatment was last night.  She was given a DuoNeb in the department today and reports it made her feel slightly better.  Flu test positive.  Out of window for Tamiflu.  We discussed symptomatic and over-the-counter care.  I have sent doxycycline to her pharmacy for her sinusitis.  She had a reported allergy of abdominal pain however we discussed that she may feel better if she takes this medication with food and the Zofran I am sending  to the pharmacy.  She is in agreement with this plan.  Ambulatory and stable for discharge home.  Final Clinical Impression(s) / ED Diagnoses Final diagnoses:  Influenza A    Rx / DC Orders Results and diagnoses were explained to the patient. Return precautions discussed in full. Patient had no additional questions and expressed complete understanding.   This chart was dictated using voice recognition software.   Despite best efforts to proofread,  errors can occur which can change the documentation meaning.     Darliss Ridgel 09/21/21 0911    Davonna Belling, MD 09/22/21 [phone]

## 2021-09-21 NOTE — Discharge Instructions (Addendum)
I am attaching a work note for you.  Use this if you would like.  You do not have a fever, which we defined as 100.4 or higher, however your temperature is slightly elevated to 99.  You may continue to treat your symptoms with over-the-counter remedies and get as much rest as you can.  I have sent some antibiotics to the pharmacy for your potential sinus infection.  Nausea medication will be at the pharmacy with it.  I hope that you feel better.

## 2021-09-21 NOTE — ED Triage Notes (Signed)
Patient c/o cough and sob, states she was seen at [facility] on tues and started on steroids and Neb tx however she feels she is getting worse c/o cough and chest burning

## 2021-09-24 MED ORDER — ALBUTEROL SULFATE HFA 108 (90 BASE) MCG/ACT IN AERS: INHALATION_SPRAY | RESPIRATORY_TRACT | 1 refills | Status: DC

## 2021-09-24 NOTE — Telephone Encounter (Signed)
I pulled in Albuterol inhaler for review.

## 2021-09-26 ENCOUNTER — Other Ambulatory Visit: Payer: Self-pay | Admitting: Internal Medicine

## 2021-09-26 DIAGNOSIS — Z1231 Encounter for screening mammogram for malignant neoplasm of breast: Secondary | ICD-10-CM

## 2021-09-26 NOTE — Telephone Encounter (Signed)
Pt called in stated she need a proscription for Albuterol to use in a nebulizer machine # please advise [phone]

## 2021-09-27 MED ORDER — ALBUTEROL SULFATE (2.5 MG/3ML) 0.083% IN NEBU: 2.5000 mg | INHALATION_SOLUTION | Freq: Four times a day (QID) | RESPIRATORY_TRACT | 1 refills | Status: DC | PRN

## 2021-09-27 NOTE — Addendum Note (Signed)
Addended by: Viviana Simpler I on: 09/27/2021 01:52 PM   Modules accepted: Orders

## 2021-09-27 NOTE — Addendum Note (Signed)
Addended by: Pilar Grammes on: 09/27/2021 10:30 AM   Modules accepted: Orders

## 2021-09-27 NOTE — Telephone Encounter (Signed)
I pulled the nebulizer solution from the medication reconciliation. Will forward to Dr Silvio Pate for approval.

## 2021-10-19 ENCOUNTER — Telehealth (INDEPENDENT_AMBULATORY_CARE_PROVIDER_SITE_OTHER): Payer: Managed Care, Other (non HMO) | Admitting: Family Medicine

## 2021-10-19 ENCOUNTER — Encounter: Payer: Managed Care, Other (non HMO) | Admitting: Internal Medicine

## 2021-10-19 ENCOUNTER — Encounter: Payer: Self-pay | Admitting: Family Medicine

## 2021-10-19 ENCOUNTER — Other Ambulatory Visit: Payer: Self-pay

## 2021-10-19 VITALS — HR 94 | Ht 60.0 in

## 2021-10-19 DIAGNOSIS — U071 COVID-19: Secondary | ICD-10-CM | POA: Diagnosis not present

## 2021-10-19 MED ORDER — NIRMATRELVIR/RITONAVIR (PAXLOVID)TABLET: 3.0000 | ORAL_TABLET | Freq: Two times a day (BID) | ORAL | 0 refills | Status: AC

## 2021-10-19 NOTE — Progress Notes (Signed)
VIRTUAL VISIT Due to national recommendations of social distancing due to [place] 19, a virtual visit is felt to be most appropriate for this patient at this time.   I connected with the patient on 10/19/21 at 10:40 AM EST by virtual telehealth platform and verified that I am speaking with the correct person using two identifiers.   I discussed the limitations, risks, security and privacy concerns of performing an evaluation and management service by  virtual telehealth platform and the availability of in person appointments. I also discussed with the patient that there may be a patient responsible charge related to this service. The patient expressed understanding and agreed to proceed.  Patient location: Home Provider Location: [facility] [place] Participants: Yvonne Stephenson and Yvonne Stephenson   Chief Complaint  Patient presents with   Covid Positive    Positive home test the morning-Symptoms started on Tuesday   Chills   Headache   Generalized Body Aches   Fever    Low Grade   Cough    Mild    History of Present Illness: 55 year old female patient of  Dr. Alla Stephenson with history of  HTN and asthma presents with COVID infection  Date of onset: 12/20   She reports  symptoms started with headache, congestion , low grade fever and chills  She has body aches.  Cough is mild and nonproductive. She is starting to feel better overall in last day.  She has albuterol to use for asthma exac.. she has using that every 4-5 hours as needed. She is on Qvar as a daily controller   She is mildly SOB with walking up stairs.  NO SOB, no wheeze  at rest.   She is using dayquil and nyquil.. it helping.. BP was normal this AM.  COVID 19 screen COVID testing: 12/23 COVID vaccine: 08/26/2020 , 01/15/2020 , 12/25/2019  COVID exposure: No recent travel . Has had several  known exposure to COVID19 at   The importance of social distancing was discussed today.     Review of Systems   Constitutional:  Negative for chills and fever.  HENT:  Negative for congestion and ear pain.   Eyes:  Negative for pain and redness.  Respiratory:  Negative for cough and shortness of breath.   Cardiovascular:  Negative for chest pain, palpitations and leg swelling.  Gastrointestinal:  Negative for abdominal pain, blood in stool, constipation, diarrhea, nausea and vomiting.  Genitourinary:  Negative for dysuria.  Musculoskeletal:  Negative for falls and myalgias.  Skin:  Negative for rash.  Neurological:  Negative for dizziness.  Psychiatric/Behavioral:  Negative for depression. The patient is not nervous/anxious.      Past Medical History:  Diagnosis Date   Allergy    seasonal   Anemia    Anxiety    Asthma    only when gets a cold-expired inhaler   GERD (gastroesophageal reflux disease)    Hyperlipidemia    past hx   Hypertension    PONV (postoperative nausea and vomiting)     reports that she has never smoked. She has never used smokeless tobacco. She reports current alcohol use. She reports that she does not use drugs.   Current Outpatient Medications:    albuterol (PROVENTIL) (2.5 MG/3ML) 0.083% nebulizer solution, Inhale 3 mLs (2.5 mg total) into the lungs every 6 (six) hours as needed., Disp: 120 mL, Rfl: 1   albuterol (VENTOLIN HFA) 108 (90 Base) MCG/ACT inhaler, INHALE 2 PUFFS BY MOUTH EVERY 6  HOURS AS NEEDED FOR WHEEZE OR SHORTNESS OF BREATH, Disp: 18 each, Rfl: 1   bisoprolol (ZEBETA) 5 MG tablet, Take 1 tablet (5 mg total) by mouth daily., Disp: 90 tablet, Rfl: 3   EPINEPHrine 0.3 mg/0.3 mL IJ SOAJ injection, Inject 0.3 mg into the muscle as needed for anaphylaxis., Disp: 1 each, Rfl: 1   fluticasone (FLONASE) 50 MCG/ACT nasal spray, SPRAY 2 SPRAYS INTO EACH NOSTRIL EVERY DAY, Disp: 16 mL, Rfl: 11   ibuprofen (ADVIL) 800 MG tablet, Take 1 tablet (800 mg total) by mouth 3 (three) times daily., Disp: 90 tablet, Rfl: 1   LORazepam (ATIVAN) 0.5 MG tablet, Take 1 tablet  (0.5 mg total) by mouth 2 (two) times daily as needed for anxiety., Disp: 30 tablet, Rfl: 0   Multiple Vitamins-Minerals (AIRBORNE PO), Take 1 tablet by mouth every Monday, Tuesday, Wednesday, Thursday, and Friday., Disp: , Rfl:    omeprazole (PRILOSEC) 20 MG capsule, Take 20 mg by mouth as needed., Disp: , Rfl:    ondansetron (ZOFRAN) 4 MG tablet, Take 1 tablet (4 mg total) by mouth every 8 (eight) hours as needed for nausea or vomiting., Disp: 14 tablet, Rfl: 0   oxyCODONE-acetaminophen (PERCOCET) 5-325 MG tablet, Take 1 tablet by mouth every 4 (four) hours as needed for severe pain., Disp: 30 tablet, Rfl: 0   QVAR 40 MCG/ACT inhaler, INHALE 2 PUFFS INTO THE LUNGS 2 (TWO) TIMES DAILY., Disp: 8.7 g, Rfl: 3   Spacer/Aero-Holding Chambers DEVI, Use as directed, Disp: 1 Canister, Rfl: 0   sucralfate (CARAFATE) 1 GM/10ML suspension, TAKE 10 MLS (1 G TOTAL) BY MOUTH 2 (TWO) TIMES DAILY., Disp: 1680 mL, Rfl: 1   vitamin B-12 (CYANOCOBALAMIN) 1000 MCG tablet, Take 1,000 mcg by mouth daily., Disp: , Rfl:    Observations/Objective: Pulse 94, height 5' (1.524 m), SpO2 98 %.  Physical Exam  Physical Exam Constitutional:      General: The patient is not in acute distress. Pulmonary:     Effort: Pulmonary effort is normal. No respiratory distress.  Neurological:     Mental Status: The patient is alert and oriented to person, place, and time.  Psychiatric:        Mood and Affect: Mood normal.        Behavior: Behavior normal.   Assessment and Plan Problem List Items Addressed This Visit     COVID-19 virus infection - Primary    COVID19  Infection < 5 days from onset of symptoms in * vaccinated overweight individual with history of HTN and asthma  No clear sign of bacterial infection at this time.   Mild asthma exacerbation.. encouraged use of Qvar and albuterol porn. She has steroid at home she can use if asthma worsening.  No SOB.  No red flags/need for ER visit or in-person exam at respiratory  clinic at this time..    Pt higher risk for COVID complications given  HTN, ashtma GFR  normal and no medication contraindications.  Start paxlovid 5 day course. Reviewed course of medication and side effect profile with patient in detail.   Symptomatic care with mucinex and cough suppressant at night. If SOB begins symptoms worsening.. have low threshold for in-person exam, if severe shortness of breath ER visit recommended.  Can monitor Oxygen saturation at home with home monitor if able to obtain.  Go to ER if O2 sat < 90% on room air.   Reviewed home care and provided information through [facility].  Recommended quarantine 5 days isolation  recommended. Return to work day 6 and wear mask for 4 more days to complete 10 days. Provided info about prevention of spread of COVID 19.       Relevant Medications   nirmatrelvir/ritonavir EUA (PAXLOVID) 20 x 150 MG & 10 x 100MG  TABS      I discussed the assessment and treatment plan with the patient. The patient was provided an opportunity to ask questions and all were answered. The patient agreed with the plan and demonstrated an understanding of the instructions.   The patient was advised to call back or seek an in-person evaluation if the symptoms worsen or if the condition fails to improve as anticipated.     Yvonne Lofts, MD

## 2021-10-19 NOTE — Assessment & Plan Note (Signed)
COVID19  Infection < 5 days from onset of symptoms in * vaccinated overweight individual with history of HTN and asthma  No clear sign of bacterial infection at this time.   Mild asthma exacerbation.. encouraged use of Qvar and albuterol porn. She has steroid at home she can use if asthma worsening.  No SOB.  No red flags/need for ER visit or in-person exam at respiratory clinic at this time..    Pt higher risk for COVID complications given  HTN, ashtma GFR  normal and no medication contraindications.  Start paxlovid 5 day course. Reviewed course of medication and side effect profile with patient in detail.   Symptomatic care with mucinex and cough suppressant at night. If SOB begins symptoms worsening.. have low threshold for in-person exam, if severe shortness of breath ER visit recommended.  Can monitor Oxygen saturation at home with home monitor if able to obtain.  Go to ER if O2 sat < 90% on room air.   Reviewed home care and provided information through [facility].  Recommended quarantine 5 days isolation recommended. Return to work day 6 and wear mask for 4 more days to complete 10 days. Provided info about prevention of spread of COVID 19.

## 2021-10-20 ENCOUNTER — Other Ambulatory Visit: Payer: Self-pay | Admitting: Internal Medicine

## 2021-10-30 ENCOUNTER — Ambulatory Visit
Admission: RE | Admit: 2021-10-30 | Discharge: 2021-10-30 | Disposition: A | Discharge status: Home / Self Care | Payer: Managed Care, Other (non HMO) | Source: Ambulatory Visit | Attending: Internal Medicine | Admitting: Internal Medicine

## 2021-10-30 DIAGNOSIS — Z1231 Encounter for screening mammogram for malignant neoplasm of breast: Secondary | ICD-10-CM

## 2021-11-14 ENCOUNTER — Ambulatory Visit: Payer: Managed Care, Other (non HMO) | Admitting: Internal Medicine

## 2021-11-16 ENCOUNTER — Other Ambulatory Visit: Payer: Self-pay | Admitting: Physical Medicine and Rehabilitation

## 2021-11-16 DIAGNOSIS — G8929 Other chronic pain: Secondary | ICD-10-CM

## 2021-11-16 DIAGNOSIS — M545 Low back pain, unspecified: Secondary | ICD-10-CM

## 2021-11-25 ENCOUNTER — Other Ambulatory Visit: Payer: Managed Care, Other (non HMO)

## 2021-11-28 ENCOUNTER — Other Ambulatory Visit: Payer: Managed Care, Other (non HMO)

## 2021-12-09 ENCOUNTER — Other Ambulatory Visit: Payer: Managed Care, Other (non HMO)

## 2021-12-09 ENCOUNTER — Encounter: Payer: Self-pay | Admitting: Internal Medicine

## 2021-12-17 ENCOUNTER — Telehealth: Payer: Self-pay | Admitting: Podiatry

## 2021-12-17 NOTE — Telephone Encounter (Signed)
Pt called stating she was trying to make her payment but it was showing a 0 balance. Upon checking it was turned over to collection agency [employer]. I gave pt number to [employer] to make the payment. [phone].

## 2021-12-18 ENCOUNTER — Ambulatory Visit: Payer: Managed Care, Other (non HMO) | Admitting: Internal Medicine

## 2021-12-23 ENCOUNTER — Ambulatory Visit
Admission: RE | Admit: 2021-12-23 | Discharge: 2021-12-23 | Disposition: A | Discharge status: Home / Self Care | Payer: Managed Care, Other (non HMO) | Source: Ambulatory Visit | Attending: Physical Medicine and Rehabilitation | Admitting: Physical Medicine and Rehabilitation

## 2021-12-23 ENCOUNTER — Other Ambulatory Visit: Payer: Self-pay

## 2021-12-23 DIAGNOSIS — M545 Low back pain, unspecified: Secondary | ICD-10-CM

## 2021-12-23 DIAGNOSIS — G8929 Other chronic pain: Secondary | ICD-10-CM

## 2021-12-24 ENCOUNTER — Other Ambulatory Visit: Payer: Self-pay | Admitting: Internal Medicine

## 2021-12-24 MED ORDER — ALBUTEROL SULFATE (2.5 MG/3ML) 0.083% IN NEBU: 2.5000 mg | INHALATION_SOLUTION | Freq: Four times a day (QID) | RESPIRATORY_TRACT | 1 refills | Status: DC | PRN

## 2021-12-24 MED ORDER — ALBUTEROL SULFATE HFA 108 (90 BASE) MCG/ACT IN AERS: INHALATION_SPRAY | RESPIRATORY_TRACT | 1 refills | Status: DC

## 2021-12-24 MED ORDER — ALBUTEROL SULFATE HFA 108 (90 BASE) MCG/ACT IN AERS: INHALATION_SPRAY | RESPIRATORY_TRACT | 1 refills | Status: AC

## 2021-12-24 NOTE — Addendum Note (Signed)
Addended by: Pilar Grammes on: 12/24/2021 09:47 AM   Modules accepted: Orders

## 2021-12-24 NOTE — Telephone Encounter (Signed)
Working on Social worker. Needed to print rx for printer tests. Rxs have been sent to local pharmacy electronically now.

## 2022-01-11 ENCOUNTER — Encounter: Payer: Self-pay | Admitting: Internal Medicine

## 2022-01-11 ENCOUNTER — Ambulatory Visit: Payer: Managed Care, Other (non HMO) | Admitting: Internal Medicine

## 2022-01-11 ENCOUNTER — Telehealth: Payer: Self-pay

## 2022-01-11 ENCOUNTER — Other Ambulatory Visit: Payer: Self-pay

## 2022-01-11 ENCOUNTER — Other Ambulatory Visit: Payer: Self-pay | Admitting: Internal Medicine

## 2022-01-11 VITALS — BP 125/80 | HR 75 | Temp 98.0°F | Ht 60.0 in | Wt 193.0 lb

## 2022-01-11 DIAGNOSIS — I10 Essential (primary) hypertension: Secondary | ICD-10-CM | POA: Diagnosis not present

## 2022-01-11 DIAGNOSIS — Z0000 Encounter for general adult medical examination without abnormal findings: Secondary | ICD-10-CM

## 2022-01-11 DIAGNOSIS — F419 Anxiety disorder, unspecified: Secondary | ICD-10-CM | POA: Diagnosis not present

## 2022-01-11 DIAGNOSIS — J4520 Mild intermittent asthma, uncomplicated: Secondary | ICD-10-CM

## 2022-01-11 LAB — COMPREHENSIVE METABOLIC PANEL
ALT: 31 U/L (ref 0–35)
AST: 20 U/L (ref 0–37)
Albumin: 4.3 g/dL (ref 3.5–5.2)
Alkaline Phosphatase: 65 U/L (ref 39–117)
BUN: 16 mg/dL (ref 6–23)
CO2: 27 mEq/L (ref 19–32)
Calcium: 10.1 mg/dL (ref 8.4–10.5)
Chloride: 101 mEq/L (ref 96–112)
Creatinine, Ser: 0.65 mg/dL (ref 0.40–1.20)
GFR: 98.58 mL/min (ref >60.00)
Glucose, Bld: 75 mg/dL (ref 70–99)
Potassium: 4 mEq/L (ref 3.5–5.1)
Sodium: 139 mEq/L (ref 135–145)
Total Bilirubin: 0.5 mg/dL (ref 0.2–1.2)
Total Protein: 7.1 g/dL (ref 6.0–8.3)

## 2022-01-11 LAB — CBC
HCT: 42.7 % (ref 36.0–46.0)
Hemoglobin: 14 g/dL (ref 12.0–15.0)
MCHC: 32.8 g/dL (ref 30.0–36.0)
MCV: 82.5 fl (ref 78.0–100.0)
Platelets: 174 10*3/uL (ref 150.0–400.0)
RBC: 5.18 Mil/uL — ABNORMAL HIGH (ref 3.87–5.11)
RDW: 14.6 % (ref 11.5–15.5)
WBC: 11.9 10*3/uL — ABNORMAL HIGH (ref 4.0–10.5)

## 2022-01-11 MED ORDER — FLUTICASONE PROPIONATE 50 MCG/ACT NA SUSP: NASAL | 11 refills | Status: DC

## 2022-01-11 MED ORDER — QVAR REDIHALER 40 MCG/ACT IN AERB: 2.0000 | INHALATION_SPRAY | Freq: Two times a day (BID) | RESPIRATORY_TRACT | 11 refills | Status: DC

## 2022-01-11 MED ORDER — SULFACETAMIDE SODIUM 10 % OP SOLN
2.0000 [drp] | Freq: Three times a day (TID) | OPHTHALMIC | 0 refills | Status: AC
Start: 2022-01-11 — End: 2022-01-18

## 2022-01-11 MED ORDER — LORAZEPAM 0.5 MG PO TABS: 0.5000 mg | ORAL_TABLET | Freq: Two times a day (BID) | ORAL | 0 refills | Status: DC | PRN

## 2022-01-11 NOTE — Assessment & Plan Note (Signed)
Healthy--but needs to get back to exercise ?Recent colon--due again in 10 years ?Gets yearly mammograms--- recent ?No pap due to hyster ?COVID booster and flu vaccine in the fall ?

## 2022-01-11 NOTE — Telephone Encounter (Signed)
Per Dr Silvio Pate: If no sulfa allergy, give Sulaymd 2 drops each eye tid 1 bottle x 0) ?If allergic, use polytrim--not sure of the dose  ? ?

## 2022-01-11 NOTE — Telephone Encounter (Signed)
Error please see message below ?

## 2022-01-11 NOTE — Assessment & Plan Note (Signed)
BP Readings from Last 3 Encounters:  ?01/11/22 125/80  ?09/21/21 (!) 153/84  ?05/21/21 126/72  ? ?High here but generally fine at home ?Will continue the bisoprolol '5mg'$  daily ?

## 2022-01-11 NOTE — Assessment & Plan Note (Signed)
Has had frequent exacerbations this winter ?Asked her to use the Qvar 40 bid regularly ?Albuterol prn ?

## 2022-01-11 NOTE — Addendum Note (Signed)
Addended by: Pilar Grammes on: 01/11/2022 04:13 PM ? ? Modules accepted: Orders ? ?

## 2022-01-11 NOTE — Telephone Encounter (Signed)
Patient is calling to see if a prescription would be sent in for pinkeye. Patient stated her eye is very scratchy and eye lid is puffy. Please advise

## 2022-01-11 NOTE — Telephone Encounter (Signed)
Spoke to pt. Sent in rx. ?

## 2022-01-11 NOTE — Assessment & Plan Note (Signed)
Uses the lorazepam rarely ?

## 2022-01-11 NOTE — Telephone Encounter (Signed)
Spoke to Yvonne Stephenson. Advised her that Dr Silvio Pate is of the office this afternoon. This is his routine afternoon off. I can send him a message but he may not get it before the end of the day or prior to me leaving at 430. ? ?If she does not hear from anyone, she will do a virtual visit on [email or web address] ?

## 2022-01-11 NOTE — Progress Notes (Signed)
? ?Subjective:  ? ? Patient ID: Yvonne Stephenson, female    DOB: 10-May-1966, 56 y.o.   MRN: [id number] ? ?HPI ?Here for physical ? ?Has had recurrent respiratory infections ?Seen in urgent care recently--just finishing new prednisone course ?Still feels congested ?Hadn't been taking the Qvar reguarly--is now ?Using the albuterol ? ?Has been "blah" due to recurrent illnesses/steroids ?Also needed steroids for back problems ?Gained back 20# ?Plans to get back to exercise ? 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 ?Current Outpatient Medications on File Prior to Visit  ?Medication Sig Dispense Refill  ? albuterol (PROVENTIL) (2.5 MG/3ML) 0.083% nebulizer solution Inhale 3 mLs (2.5 mg total) into the lungs every 6 (six) hours as needed. 120 mL 1  ? albuterol (VENTOLIN HFA) 108 (90 Base) MCG/ACT inhaler INHALE 2 PUFFS BY MOUTH EVERY 6 HOURS AS NEEDED FOR WHEEZE OR SHORTNESS OF BREATH 8.5 each 1  ? bisoprolol (ZEBETA) 5 MG tablet TAKE 1 TABLET (5 MG TOTAL) BY MOUTH DAILY. 90 tablet 0  ? EPINEPHrine 0.3 mg/0.3 mL IJ SOAJ injection Inject 0.3 mg into the muscle as needed for anaphylaxis. 1 each 1  ? fluticasone (FLONASE) 50 MCG/ACT nasal spray SPRAY 2 SPRAYS INTO EACH NOSTRIL EVERY DAY 16 mL 11  ? ibuprofen (ADVIL) 800 MG tablet Take 1 tablet (800 mg total) by mouth 3 (three) times daily. 90 tablet 1  ? LORazepam (ATIVAN) 0.5 MG tablet Take 1 tablet (0.5 mg total) by mouth 2 (two) times daily as needed for anxiety. 30 tablet 0  ? Multiple Vitamins-Minerals (AIRBORNE PO) Take 1 tablet by mouth every Monday, Tuesday, Wednesday, Thursday, and Friday.    ? omeprazole (PRILOSEC) 20 MG capsule Take 20 mg by mouth as needed.    ? ondansetron (ZOFRAN) 4 MG tablet Take 1 tablet (4 mg total) by mouth every 8 (eight) hours as needed for nausea or vomiting. 14 tablet 0  ? QVAR 40 MCG/ACT inhaler INHALE 2 PUFFS INTO THE LUNGS 2 (TWO) TIMES DAILY. 8.7 g 3  ? Spacer/Aero-Holding Dorise Bullion Use as directed 1  Canister 0  ? vitamin B-12 (CYANOCOBALAMIN) 1000 MCG tablet Take 1,000 mcg by mouth daily.    ? sucralfate (CARAFATE) 1 GM/10ML suspension TAKE 10 MLS (1 G TOTAL) BY MOUTH 2 (TWO) TIMES DAILY. 1680 mL 1  ? ?No current facility-administered medications on file prior to visit.  ? ? ?Allergies  ?Allergen Reactions  ? Penicillins Shortness Of Breath  ?  Has patient had a PCN reaction causing immediate rash, facial/tongue/throat swelling, SOB or lightheadedness with hypotension: Yes ?Has patient had a PCN reaction causing severe rash involving mucus membranes or skin necrosis: No ?Has patient had a PCN reaction that required hospitalization: No ?Has patient had a PCN reaction occurring within the last 10 years: No ?If all of the above answers are "NO", then may proceed with Cephalosporin use. ? ? ?REACTION: difficulty breathing  ? Shellfish Allergy Shortness Of Breath  ? Amoxicillin-Pot Clavulanate Swelling  ?  Tongue swelling  ? Atorvastatin   ?  REACTION: hip pain  ? Bee Venom Swelling  ?  Shakes, chills , vomiting  ? Cefprozil   ?  REACTION: rash  ? Demerol Nausea And Vomiting  ? Dilaudid [Hydromorphone Hcl] Nausea And Vomiting  ? Doxycycline Other (See Comments)  ?  Severe Abdominal Pain  ? Meloxicam [Meloxicam] Swelling  ?  Face swelling and lip tingling  ? Zithromax [Azithromycin]   ?  REACTION: tongue felt funny  ? ? ?Past Medical History:  ?Diagnosis Date  ? Allergy   ? seasonal  ? Anemia   ? Anxiety   ? Asthma   ? only when gets a cold-expired inhaler  ? GERD (gastroesophageal reflux disease)   ? Hyperlipidemia   ? past hx  ? Hypertension   ? PONV (postoperative nausea and vomiting)   ? ? ?Past Surgical History:  ?Procedure Laterality Date  ? ABDOMINAL HYSTERECTOMY  07/2006  ? BREAST BIOPSY Left   ? BREAST EXCISIONAL BIOPSY Right   ? BREAST SURGERY  2004  ? LUMPECTOMY  ? CARPAL TUNNEL RELEASE  01/2009  ? LEFT (Dr. Tommie Raymond)  ? CESAREAN SECTION    ? 3  ? COLONOSCOPY  2010  ? LAPAROSCOPY  04/09/2012  ? Procedure:  LAPAROSCOPY OPERATIVE;  Surgeon: Donnamae Jude, MD;  Location: [facility];  Service: Gynecology;  Laterality: N/A;  ? NASAL STENOSIS REPAIR  2003  ? POLYPECTOMY    ? TA 2010  ? SALPINGOOPHORECTOMY  04/09/2012  ? Procedure: SALPINGO OOPHERECTOMY;  Surgeon: Donnamae Jude, MD;  Location: [facility];  Service: Gynecology;  Laterality: Right;  ? SHOULDER SURGERY Right 2016  ? spur on bone, states shoulder dislocated, Percell Miller  ? TUBAL LIGATION    ? ? ?Family History  ?Problem Relation Age of Onset  ? Heart attack Mother   ? Heart disease Mother   ? Hypertension Mother   ? Stroke Mother   ? Lung cancer Mother   ?     smoked  ? Allergies Mother   ? Heart attack Father   ? Heart disease Father   ? Hypertension Father   ? Diabetes Maternal Uncle   ? Cancer Maternal Grandmother   ? Heart failure Maternal Grandfather   ? Heart failure Paternal Grandmother   ? Colon cancer Neg Hx   ? Colon polyps Neg Hx   ? Esophageal cancer Neg Hx   ? Rectal cancer Neg Hx   ? Stomach cancer Neg Hx   ? ? ?Social History  ? ?Socioeconomic History  ? Marital status: Married  ?  Spouse name: Not on file  ? Number of children: 3  ? Years of education: Not on file  ? Highest education level: Not on file  ?Occupational History  ? Occupation: Optometrist  ?  Comment: [employer] in [place]  ? Occupation:    ?Tobacco Use  ? Smoking status: Never  ? Smokeless tobacco: Never  ?Vaping Use  ? Vaping Use: Never used  ?Substance and Sexual Activity  ? Alcohol use: Yes  ?  Comment: occasionaly, every few months  ? Drug use: No  ? Sexual activity: Not on file  ?Other Topics Concern  ? Not on file  ?Social History Narrative  ? Does walk a little for exercise  ? ?Social Determinants of Health  ? ?Financial Resource Strain: Not on file  ?Food Insecurity: Not on file  ?Transportation Needs: Not on file  ?Physical Activity: Not on file  ?Stress: Not on file  ?Social Connections: Not on file  ?Intimate Partner Violence: Not on file  ? ?Review of Systems   ?Constitutional:  Positive for fatigue and unexpected weight change.  ?     Busy at school ?Wears seat belt  ?HENT:  Negative for tinnitus and trouble swallowing.   ?     Ears clogged with infections ?Grinds teeth---now one is cracked. Going to the dentist  ?Eyes:  Positive for redness. Negative for visual disturbance.  ?Respiratory:  Positive for cough and wheezing. Negative for shortness of breath.   ?Cardiovascular:  Positive for palpitations. Negative for chest pain and leg swelling.  ?     Some palpitations since on steroids  ?Gastrointestinal:  Negative for blood in stool and constipation.  ?     No heartburn--hasn't needed the omeprazole  ?Endocrine: Positive for polydipsia. Negative for polyuria.  ?Genitourinary:  Negative for dyspareunia and hematuria.  ?Musculoskeletal:  Positive for back pain. Negative for arthralgias and joint swelling.  ?Skin:  Negative for rash.  ?Allergic/Immunologic: Positive for environmental allergies. Negative for immunocompromised state.  ?     Now taking allergy pills and flonase  ?Neurological:  Negative for dizziness, syncope, light-headedness and headaches.  ?Hematological:  Negative for adenopathy. Does not bruise/bleed easily.  ?Psychiatric/Behavioral:  Negative for dysphoric mood and sleep disturbance.   ?     Rarely uses the lorazepam for anxiety   ? ?   ?Objective:  ? Physical Exam ?Constitutional:   ?   Appearance: Normal appearance.  ?HENT:  ?   Right Ear: Tympanic membrane and ear canal normal.  ?   Left Ear: Tympanic membrane and ear canal normal.  ?   Mouth/Throat:  ?   Pharynx: No oropharyngeal exudate or posterior oropharyngeal erythema.  ?Eyes:  ?   Conjunctiva/sclera: Conjunctivae normal.  ?   Pupils: Pupils are equal, round, and reactive to light.  ?Cardiovascular:  ?   Rate and Rhythm: Normal rate and regular rhythm.  ?   Pulses: Normal pulses.  ?   Heart sounds: No murmur heard. ?  No gallop.  ?Pulmonary:  ?   Effort: Pulmonary effort is normal.  ?   Breath  sounds: Normal breath sounds. No wheezing or rales.  ?Abdominal:  ?   Palpations: Abdomen is soft.  ?   Tenderness: There is no abdominal tenderness.  ?Musculoskeletal:  ?   Cervical back: Neck supple

## 2022-01-14 NOTE — Telephone Encounter (Signed)
Prior script was sent in on 12/24/21 with 1 refill. No refill should be necessary now.  ?Contacted pharmacy and they reported the refill request was an error and they have the inhaler ready for the pt.  ?Denied prescription, too soon to refill.  ?

## 2022-02-01 ENCOUNTER — Other Ambulatory Visit: Payer: Self-pay | Admitting: Internal Medicine

## 2022-03-11 ENCOUNTER — Other Ambulatory Visit: Payer: Self-pay | Admitting: Physical Medicine and Rehabilitation

## 2022-03-11 DIAGNOSIS — M25559 Pain in unspecified hip: Secondary | ICD-10-CM

## 2022-03-14 ENCOUNTER — Encounter: Payer: Managed Care, Other (non HMO) | Admitting: Internal Medicine

## 2022-03-27 ENCOUNTER — Encounter: Payer: Self-pay | Admitting: Internal Medicine

## 2022-03-27 ENCOUNTER — Ambulatory Visit: Payer: Managed Care, Other (non HMO) | Admitting: Internal Medicine

## 2022-03-27 VITALS — BP 120/84 | HR 80 | Temp 98.3°F | Ht 60.0 in | Wt 193.0 lb

## 2022-03-27 DIAGNOSIS — J020 Streptococcal pharyngitis: Secondary | ICD-10-CM | POA: Diagnosis not present

## 2022-03-27 DIAGNOSIS — J029 Acute pharyngitis, unspecified: Secondary | ICD-10-CM

## 2022-03-27 LAB — POCT RAPID STREP A (OFFICE): Rapid Strep A Screen: POSITIVE — AB

## 2022-03-27 MED ORDER — CLINDAMYCIN HCL 300 MG PO CAPS: 300.0000 mg | ORAL_CAPSULE | Freq: Three times a day (TID) | ORAL | 0 refills | Status: DC

## 2022-03-27 NOTE — Assessment & Plan Note (Addendum)
Rapid test positive Allergic reactions to PCN and azithromycin Will treat with clinda x 10 days Supportive care---tylenol  OOW till 6/2

## 2022-03-27 NOTE — Progress Notes (Signed)
Subjective:    Patient ID: Yvonne Stephenson, female    DOB: [birth year], 56 y.o.   MRN: [id number]  HPI Here due to sore throat  Exposed to strep--3 kids in class last week Started with sore throat yesterday AM Also with bad headache Throat now feels "sandy" Some sinus symptoms and clogged ears  Low grade fever yesterday--100.5 (sent home from school)  No cough No SOB  Current Outpatient Medications on File Prior to Visit  Medication Sig Dispense Refill   albuterol (PROVENTIL) (2.5 MG/3ML) 0.083% nebulizer solution Inhale 3 mLs (2.5 mg total) into the lungs every 6 (six) hours as needed. 120 mL 1   albuterol (VENTOLIN HFA) 108 (90 Base) MCG/ACT inhaler INHALE 2 PUFFS BY MOUTH EVERY 6 HOURS AS NEEDED FOR WHEEZE OR SHORTNESS OF BREATH 8.5 each 1   beclomethasone (QVAR REDIHALER) 40 MCG/ACT inhaler Inhale 2 puffs into the lungs 2 (two) times daily. 1 each 11   bisoprolol (ZEBETA) 5 MG tablet TAKE 1 TABLET (5 MG TOTAL) BY MOUTH DAILY. 90 tablet 0   EPINEPHrine 0.3 mg/0.3 mL IJ SOAJ injection Inject 0.3 mg into the muscle as needed for anaphylaxis. 1 each 1   fluticasone (FLONASE) 50 MCG/ACT nasal spray SPRAY 2 SPRAYS INTO EACH NOSTRIL EVERY DAY 16 mL 11   LORazepam (ATIVAN) 0.5 MG tablet Take 1 tablet (0.5 mg total) by mouth 2 (two) times daily as needed for anxiety. 30 tablet 0   Multiple Vitamins-Minerals (AIRBORNE PO) Take 1 tablet by mouth every Monday, Tuesday, Wednesday, Thursday, and Friday.     omeprazole (PRILOSEC) 20 MG capsule Take 20 mg by mouth as needed.     ondansetron (ZOFRAN) 4 MG tablet Take 1 tablet (4 mg total) by mouth every 8 (eight) hours as needed for nausea or vomiting. 14 tablet 0   Spacer/Aero-Holding Chambers DEVI Use as directed 1 Canister 0   vitamin B-12 (CYANOCOBALAMIN) 1000 MCG tablet Take 1,000 mcg by mouth daily.     No current facility-administered medications on file prior to visit.    Allergies  Allergen Reactions   Penicillins Shortness Of  Breath    Has patient had a PCN reaction causing immediate rash, facial/tongue/throat swelling, SOB or lightheadedness with hypotension: Yes Has patient had a PCN reaction causing severe rash involving mucus membranes or skin necrosis: No Has patient had a PCN reaction that required hospitalization: No Has patient had a PCN reaction occurring within the last 10 years: No If all of the above answers are "NO", then may proceed with Cephalosporin use.   REACTION: difficulty breathing   Shellfish Allergy Shortness Of Breath   Amoxicillin-Pot Clavulanate Swelling    Tongue swelling   Atorvastatin     REACTION: hip pain   Bee Venom Swelling    Shakes, chills , vomiting   Cefprozil     REACTION: rash   Demerol Nausea And Vomiting   Dilaudid [Hydromorphone Hcl] Nausea And Vomiting   Doxycycline Other (See Comments)    Severe Abdominal Pain   Meloxicam [Meloxicam] Swelling    Face swelling and lip tingling   Zithromax [Azithromycin]     REACTION: tongue felt funny    Past Medical History:  Diagnosis Date   Allergy    seasonal   Anemia    Anxiety    Asthma    only when gets a cold-expired inhaler   GERD (gastroesophageal reflux disease)    Hyperlipidemia    past hx   Hypertension    PONV (  postoperative nausea and vomiting)     Past Surgical History:  Procedure Laterality Date   ABDOMINAL HYSTERECTOMY  07/2006   BREAST BIOPSY Left    BREAST EXCISIONAL BIOPSY Right    BREAST SURGERY  2004   LUMPECTOMY   CARPAL TUNNEL RELEASE  01/2009   LEFT (Dr. Tommie Raymond)   CESAREAN SECTION     3   COLONOSCOPY  2010   LAPAROSCOPY  04/09/2012   Procedure: LAPAROSCOPY OPERATIVE;  Surgeon: Donnamae Jude, MD;  Location: [facility];  Service: Gynecology;  Laterality: N/A;   NASAL STENOSIS REPAIR  2003   POLYPECTOMY     TA 2010   SALPINGOOPHORECTOMY  04/09/2012   Procedure: SALPINGO OOPHERECTOMY;  Surgeon: Donnamae Jude, MD;  Location: [facility];  Service: Gynecology;  Laterality: Right;    SHOULDER SURGERY Right 2016   spur on bone, states shoulder dislocated, Murphy   TUBAL LIGATION      Family History  Problem Relation Age of Onset   Heart attack Mother    Heart disease Mother    Hypertension Mother    Stroke Mother    Lung cancer Mother        smoked   Allergies Mother    Heart attack Father    Heart disease Father    Hypertension Father    Diabetes Maternal Uncle    Cancer Maternal Grandmother    Heart failure Maternal Grandfather    Heart failure Paternal Grandmother    Colon cancer Neg Hx    Colon polyps Neg Hx    Esophageal cancer Neg Hx    Rectal cancer Neg Hx    Stomach cancer Neg Hx     Social History   Socioeconomic History   Marital status: Married    Spouse name: Not on file   Number of children: 3   Years of education: Not on file   Highest education level: Not on file  Occupational History   Occupation: Optometrist    Comment: [employer] in [place]   Occupation:    Tobacco Use   Smoking status: Never   Smokeless tobacco: Never  Vaping Use   Vaping Use: Never used  Substance and Sexual Activity   Alcohol use: Yes    Comment: occasionaly, every few months   Drug use: No   Sexual activity: Not on file  Other Topics Concern   Not on file  Social History Narrative   Does walk a little for exercise   Social Determinants of Health   Financial Resource Strain: Not on file  Food Insecurity: Not on file  Transportation Needs: Not on file  Physical Activity: Not on file  Stress: Not on file  Social Connections: Not on file  Intimate Partner Violence: Not on file   Review of Systems No N/V No rash Eating okay    Objective:   Physical Exam Constitutional:      Appearance: Normal appearance.  HENT:     Head:     Comments: Slight frontal tenderness    Right Ear: Tympanic membrane and ear canal normal.     Left Ear: Tympanic membrane and ear canal normal.     Nose:     Comments: Mild inflammation     Mouth/Throat:     Comments: Redness, mild enlargement mostly on right---slight exudate Neck:     Comments: Non tender mild anterior cervical adenopathy Pulmonary:     Effort: Pulmonary effort is normal.     Breath  sounds: Normal breath sounds. No wheezing or rales.  Musculoskeletal:     Cervical back: Neck supple.  Neurological:     Mental Status: She is alert.           Assessment & Plan:

## 2022-03-30 ENCOUNTER — Other Ambulatory Visit: Payer: Managed Care, Other (non HMO)

## 2022-06-19 ENCOUNTER — Other Ambulatory Visit: Payer: Self-pay | Admitting: Internal Medicine

## 2022-06-28 HISTORY — PX: LUMBAR FUSION: SHX111

## 2022-07-03 DIAGNOSIS — M48062 Spinal stenosis, lumbar region with neurogenic claudication: Secondary | ICD-10-CM | POA: Diagnosis not present

## 2022-07-03 DIAGNOSIS — Z6838 Body mass index (BMI) 38.0-38.9, adult: Secondary | ICD-10-CM | POA: Diagnosis not present

## 2022-07-10 DIAGNOSIS — M5136 Other intervertebral disc degeneration, lumbar region: Secondary | ICD-10-CM | POA: Diagnosis not present

## 2022-07-10 DIAGNOSIS — M48062 Spinal stenosis, lumbar region with neurogenic claudication: Secondary | ICD-10-CM | POA: Diagnosis not present

## 2022-07-10 DIAGNOSIS — M5116 Intervertebral disc disorders with radiculopathy, lumbar region: Secondary | ICD-10-CM | POA: Diagnosis not present

## 2022-07-10 DIAGNOSIS — M47816 Spondylosis without myelopathy or radiculopathy, lumbar region: Secondary | ICD-10-CM | POA: Diagnosis not present

## 2022-08-29 DIAGNOSIS — M48062 Spinal stenosis, lumbar region with neurogenic claudication: Secondary | ICD-10-CM | POA: Diagnosis not present

## 2022-09-03 DIAGNOSIS — M48062 Spinal stenosis, lumbar region with neurogenic claudication: Secondary | ICD-10-CM | POA: Diagnosis not present

## 2022-09-11 ENCOUNTER — Encounter: Payer: Self-pay | Admitting: Internal Medicine

## 2022-09-12 ENCOUNTER — Other Ambulatory Visit: Payer: Self-pay | Admitting: Internal Medicine

## 2022-09-12 DIAGNOSIS — M48062 Spinal stenosis, lumbar region with neurogenic claudication: Secondary | ICD-10-CM | POA: Diagnosis not present

## 2022-09-24 DIAGNOSIS — M48062 Spinal stenosis, lumbar region with neurogenic claudication: Secondary | ICD-10-CM | POA: Diagnosis not present

## 2022-10-17 ENCOUNTER — Telehealth: Payer: Managed Care, Other (non HMO) | Admitting: Emergency Medicine

## 2022-10-17 ENCOUNTER — Other Ambulatory Visit: Payer: Self-pay | Admitting: Internal Medicine

## 2022-10-17 ENCOUNTER — Telehealth: Payer: BC Managed Care – PPO | Admitting: Physician Assistant

## 2022-10-17 ENCOUNTER — Encounter: Payer: Self-pay | Admitting: Internal Medicine

## 2022-10-17 DIAGNOSIS — B9789 Other viral agents as the cause of diseases classified elsewhere: Secondary | ICD-10-CM

## 2022-10-17 DIAGNOSIS — J019 Acute sinusitis, unspecified: Secondary | ICD-10-CM | POA: Diagnosis not present

## 2022-10-17 MED ORDER — SULFAMETHOXAZOLE-TRIMETHOPRIM 800-160 MG PO TABS: 1.0000 | ORAL_TABLET | Freq: Two times a day (BID) | ORAL | 0 refills | Status: DC

## 2022-10-17 MED ORDER — LORAZEPAM 0.5 MG PO TABS: 0.5000 mg | ORAL_TABLET | Freq: Two times a day (BID) | ORAL | 0 refills | Status: DC | PRN

## 2022-10-17 NOTE — Telephone Encounter (Signed)
Patient comment: I am having some really bad issues with a family member that is causing me a lot of stress. I only have like three pills left. I was looking to see if I could get a refill on this before the weekend and the holiday.   Last filled 01-11-22 #30 Last OV Acute 03-27-22 Next OV 01-24-23 [facility]

## 2022-10-17 NOTE — Progress Notes (Signed)
E-Visit for Sinus Problems  We are sorry that you are not feeling well.  Here is how we plan to help!  Based on what you have shared with me it looks like you have sinusitis.  Sinusitis is inflammation and infection in the sinus cavities of the head.  Based on your presentation I believe you most likely have Acute Viral Sinusitis.This is an infection most likely caused by a virus. There is not specific treatment for viral sinusitis other than to help you with the symptoms until the infection runs its course.    Antibiotics are not recommended by the Infectious Disease Society of [place] unless you have severe symptoms (including high fever) or you have symptoms for more than 10 days. If you still have symptoms after 10 days, antibiotics should be considered.   You may use an oral decongestant such as Mucinex D or if you have glaucoma or high blood pressure use plain Mucinex.   Saline nasal spray help and can safely be used as often as needed for congestion. Try using saline irrigation, such as with a neti pot, several times a day while you are sick. Many neti pots come with salt packets premeasured to use to make saline. If you use your own salt, make sure it is kosher salt or sea salt (don't use table salt as it has iodine in it and you don't need that in your nose). Use distilled water to make saline. If you mix your own saline using your own salt, the recipe is 1/4 teaspoon salt in 1 cup warm water. Using saline irrigation can help prevent and treat sinus infections.   Continue using your Flonase.    Some authorities believe that zinc sprays or the use of Echinacea may shorten the course of your symptoms.  Sinus infections are not as easily transmitted as other respiratory infection, however we still recommend that you avoid close contact with loved ones, especially the very young and elderly.  Remember to wash your hands thoroughly throughout the day as this is the number one way to prevent the  spread of infection!  Home Care: Only take medications as instructed by your medical team. Do not take these medications with alcohol. A steam or ultrasonic humidifier can help congestion.  You can place a towel over your head and breathe in the steam from hot water coming from a faucet. Avoid close contacts especially the very young and the elderly. Cover your mouth when you cough or sneeze. Always remember to wash your hands.  Get Help Right Away If: You develop worsening fever or sinus pain. You develop a severe head ache or visual changes. Your symptoms persist after you have completed your treatment plan.  Make sure you Understand these instructions. Will watch your condition. Will get help right away if you are not doing well or get worse.   Thank you for choosing an e-visit.  Your e-visit answers were reviewed by a board certified advanced clinical practitioner to complete your personal care plan. Depending upon the condition, your plan could have included both over the counter or prescription medications.  Please review your pharmacy choice. Make sure the pharmacy is open so you can pick up prescription now. If there is a problem, you may contact your provider through [employer] and have the prescription routed to another pharmacy.  Your safety is important to [place]. If you have drug allergies check your prescription carefully.   For the next 24 hours you can use MyChart  to ask questions about today's visit, request a non-urgent call back, or ask for a work or school excuse. You will get an email in the next two days asking about your experience. I hope that your e-visit has been valuable and will speed your recovery.  I have spent 5 minutes in review of e-visit questionnaire, review and updating patient chart, medical decision making and response to patient.   Willeen Cass, PhD, FNP-BC

## 2022-10-17 NOTE — Patient Instructions (Signed)
Yvonne Stephenson, thank you for joining [name], PA-C for today's virtual visit.  While this provider is not your primary care provider (PCP), if your PCP is located in our provider database this encounter information will be shared with them immediately following your visit.   [facility] account gives you access to today's visit and all your visits, tests, and labs performed at [facility] " click here if you don't have a [facility] account or go to [email or web address]  Consent: (Patient) Yvonne Stephenson provided verbal consent for this virtual visit at the beginning of the encounter.  Current Medications:  Current Outpatient Medications:    albuterol (PROVENTIL) (2.5 MG/3ML) 0.083% nebulizer solution, INHALE 3 MLS (2.5 MG TOTAL) INTO THE LUNGS EVERY 6 (SIX) HOURS AS NEEDED., Disp: 150 mL, Rfl: 1   albuterol (VENTOLIN HFA) 108 (90 Base) MCG/ACT inhaler, INHALE 2 PUFFS BY MOUTH EVERY 6 HOURS AS NEEDED FOR WHEEZE OR SHORTNESS OF BREATH, Disp: 8.5 each, Rfl: 1   beclomethasone (QVAR REDIHALER) 40 MCG/ACT inhaler, Inhale 2 puffs into the lungs 2 (two) times daily., Disp: 1 each, Rfl: 11   bisoprolol (ZEBETA) 5 MG tablet, TAKE 1 TABLET (5 MG TOTAL) BY MOUTH DAILY., Disp: 90 tablet, Rfl: 0   clindamycin (CLEOCIN) 300 MG capsule, Take 1 capsule (300 mg total) by mouth 3 (three) times daily., Disp: 30 capsule, Rfl: 0   EPINEPHrine 0.3 mg/0.3 mL IJ SOAJ injection, Inject 0.3 mg into the muscle as needed for anaphylaxis., Disp: 1 each, Rfl: 1   fluticasone (FLONASE) 50 MCG/ACT nasal spray, SPRAY 2 SPRAYS INTO EACH NOSTRIL EVERY DAY, Disp: 16 mL, Rfl: 11   LORazepam (ATIVAN) 0.5 MG tablet, Take 1 tablet (0.5 mg total) by mouth 2 (two) times daily as needed for anxiety., Disp: 30 tablet, Rfl: 0   Multiple Vitamins-Minerals (AIRBORNE PO), Take 1 tablet by mouth every Monday, Tuesday, Wednesday, Thursday, and Friday., Disp: , Rfl:    omeprazole (PRILOSEC) 20  MG capsule, Take 20 mg by mouth as needed., Disp: , Rfl:    ondansetron (ZOFRAN) 4 MG tablet, Take 1 tablet (4 mg total) by mouth every 8 (eight) hours as needed for nausea or vomiting., Disp: 14 tablet, Rfl: 0   Spacer/Aero-Holding Chambers DEVI, Use as directed, Disp: 1 Canister, Rfl: 0   vitamin B-12 (CYANOCOBALAMIN) 1000 MCG tablet, Take 1,000 mcg by mouth daily., Disp: , Rfl:    Medications ordered in this encounter:  No orders of the defined types were placed in this encounter.    *If you need refills on other medications prior to your next appointment, please contact your pharmacy*  Follow-Up: Call back or seek an in-person evaluation if the symptoms worsen or if the condition fails to improve as anticipated.  [facility] [phone]  Other Instructions   If you have been instructed to have an in-person evaluation today at a local Urgent Care facility, please use the link below. It will take you to a list of all of our available [facility], including address, phone number and hours of operation. Please do not delay care.  [facility]  If you or a family member do not have a primary care provider, use the link below to schedule a visit and establish care. When you choose a [facility] primary care physician or advanced practice provider, you gain a long-term partner in health. Find a Primary Care Provider  Learn more about [facility]'s in-office and virtual care options: [facility]

## 2022-10-17 NOTE — Progress Notes (Signed)
Virtual Visit Consent   Yvonne Stephenson, you are scheduled for a virtual visit with a [facility] provider today. Just as with appointments in the office, your consent must be obtained to participate. Your consent will be active for this visit and any virtual visit you may have with one of our providers in the next 365 days. If you have a MyChart account, a copy of this consent can be sent to you electronically.  As this is a virtual visit, video technology does not allow for your provider to perform a traditional examination. This may limit your provider's ability to fully assess your condition. If your provider identifies any concerns that need to be evaluated in person or the need to arrange testing (such as labs, EKG, etc.), we will make arrangements to do so. Although advances in technology are sophisticated, we cannot ensure that it will always work on either your end or our end. If the connection with a video visit is poor, the visit may have to be switched to a telephone visit. With either a video or telephone visit, we are not always able to ensure that we have a secure connection.  By engaging in this virtual visit, you consent to the provision of healthcare and authorize for your insurance to be billed (if applicable) for the services provided during this visit. Depending on your insurance coverage, you may receive a charge related to this service.  I need to obtain your verbal consent now. Are you willing to proceed with your visit today? KARLEIGH BUNTE has provided verbal consent on 10/17/2022 for a virtual visit (video or telephone). Yvonne Stephenson, [place]  Date: 10/17/2022 5:38 PM  Virtual Visit via Video Note   I, Yvonne Stephenson, connected with  CLEORA KARNIK  ([id number], 10-Oct-1966) on 10/17/22 at  5:15 PM EST by a video-enabled telemedicine application and verified that I am speaking with the correct person using two identifiers.  Location: Patient: Virtual Visit  Location Patient: Home Provider: Virtual Visit Location Provider: Home Office   I discussed the limitations of evaluation and management by telemedicine and the availability of in person appointments. The patient expressed understanding and agreed to proceed.    History of Present Illness: Yvonne Stephenson is a 56 y.o. who identifies as a female who was assigned female at birth, and is being seen today for 3 days of nasal congestion, sinus pressure and now maxillary sinus pain with thick, discolored drainage. Denies fever, chills, aches. Denies recent travel or known sick contact has history of seasonal asthma, but no increased work of breathing. Negative COVID. Was seen earlier today via EV and diagnosed with an acute viral sinusitis. Supportive measures and OTC medications reviewed by treating provider at that time. She is worried because of the holiday and that this always turns into a bacterial infection that requires antibiotics to treat.[name]     HPI: HPI  Problems:  Patient Active Problem List   Diagnosis Date Noted   Strep pharyngitis 03/27/2022   Mild intermittent asthma 08/26/2016   Asthma with acute exacerbation 08/12/2016   Preventative health care 07/10/2012   Fatty infiltration of liver 01/14/2012   Hypertriglyceridemia 09/10/2007   Mild anxiety 09/10/2007   Essential hypertension, benign 09/10/2007   GERD 09/10/2007    Allergies:  Allergies  Allergen Reactions   Penicillins Shortness Of Breath    Has patient had a PCN reaction causing immediate rash, facial/tongue/throat swelling, SOB or lightheadedness with hypotension: Yes Has patient had  a PCN reaction causing severe rash involving mucus membranes or skin necrosis: No Has patient had a PCN reaction that required hospitalization: No Has patient had a PCN reaction occurring within the last 10 years: No If all of the above answers are "NO", then may proceed with Cephalosporin use.   REACTION: difficulty breathing    Shellfish Allergy Shortness Of Breath   Amoxicillin-Pot Clavulanate Swelling    Tongue swelling   Atorvastatin     REACTION: hip pain   Bee Venom Swelling    Shakes, chills , vomiting   Cefprozil     REACTION: rash   Demerol Nausea And Vomiting   Dilaudid [Hydromorphone Hcl] Nausea And Vomiting   Doxycycline Other (See Comments)    Severe Abdominal Pain   Meloxicam [Meloxicam] Swelling    Face swelling and lip tingling   Zithromax [Azithromycin]     REACTION: tongue felt funny   Medications:  Current Outpatient Medications:    sulfamethoxazole-trimethoprim (BACTRIM DS) 800-160 MG tablet, Take 1 tablet by mouth 2 (two) times daily., Disp: 20 tablet, Rfl: 0   albuterol (PROVENTIL) (2.5 MG/3ML) 0.083% nebulizer solution, INHALE 3 MLS (2.5 MG TOTAL) INTO THE LUNGS EVERY 6 (SIX) HOURS AS NEEDED., Disp: 150 mL, Rfl: 1   albuterol (VENTOLIN HFA) 108 (90 Base) MCG/ACT inhaler, INHALE 2 PUFFS BY MOUTH EVERY 6 HOURS AS NEEDED FOR WHEEZE OR SHORTNESS OF BREATH, Disp: 8.5 each, Rfl: 1   beclomethasone (QVAR REDIHALER) 40 MCG/ACT inhaler, Inhale 2 puffs into the lungs 2 (two) times daily., Disp: 1 each, Rfl: 11   bisoprolol (ZEBETA) 5 MG tablet, TAKE 1 TABLET (5 MG TOTAL) BY MOUTH DAILY., Disp: 90 tablet, Rfl: 0   clindamycin (CLEOCIN) 300 MG capsule, Take 1 capsule (300 mg total) by mouth 3 (three) times daily., Disp: 30 capsule, Rfl: 0   EPINEPHrine 0.3 mg/0.3 mL IJ SOAJ injection, Inject 0.3 mg into the muscle as needed for anaphylaxis., Disp: 1 each, Rfl: 1   fluticasone (FLONASE) 50 MCG/ACT nasal spray, SPRAY 2 SPRAYS INTO EACH NOSTRIL EVERY DAY, Disp: 16 mL, Rfl: 11   LORazepam (ATIVAN) 0.5 MG tablet, Take 1 tablet (0.5 mg total) by mouth 2 (two) times daily as needed for anxiety., Disp: 30 tablet, Rfl: 0   Multiple Vitamins-Minerals (AIRBORNE PO), Take 1 tablet by mouth every Monday, Tuesday, Wednesday, Thursday, and Friday., Disp: , Rfl:    omeprazole (PRILOSEC) 20 MG capsule, Take 20 mg by  mouth as needed., Disp: , Rfl:    ondansetron (ZOFRAN) 4 MG tablet, Take 1 tablet (4 mg total) by mouth every 8 (eight) hours as needed for nausea or vomiting., Disp: 14 tablet, Rfl: 0   Spacer/Aero-Holding Chambers DEVI, Use as directed, Disp: 1 Canister, Rfl: 0   vitamin B-12 (CYANOCOBALAMIN) 1000 MCG tablet, Take 1,000 mcg by mouth daily., Disp: , Rfl:   Observations/Objective: Patient is well-developed, well-nourished in no acute distress.  Resting comfortably at home.  Head is normocephalic, atraumatic.  No labored breathing. Speech is clear and coherent with logical content.  Patient is alert and oriented at baseline.    Assessment and Plan: 1. Acute viral sinusitis  Reassured her that giving 3 days of symptoms, lack of fever and COVID negative, this is most likely an acute viral sinusitis. Reiterated OTC medication recommendations and supportive measures. Discussed giving her concerns and history, I will put a script on file for Bactrim (chosen due to multiple antibiotic allergies) for delayed antibiotic therapy if symptoms continue progress after the next  48 hours, giving the upcoming Christmas holiday.   Follow Up Instructions: I discussed the assessment and treatment plan with the patient. The patient was provided an opportunity to ask questions and all were answered. The patient agreed with the plan and demonstrated an understanding of the instructions.  A copy of instructions were sent to the patient via MyChart unless otherwise noted below.   The patient was advised to call back or seek an in-person evaluation if the symptoms worsen or if the condition fails to improve as anticipated.  Time:  I spent 10 minutes with the patient via telehealth technology discussing the above problems/concerns.    Yvonne Rio, PA-C

## 2022-11-11 ENCOUNTER — Encounter: Payer: Self-pay | Admitting: Internal Medicine

## 2022-11-11 ENCOUNTER — Telehealth (INDEPENDENT_AMBULATORY_CARE_PROVIDER_SITE_OTHER): Payer: BC Managed Care – PPO | Admitting: Internal Medicine

## 2022-11-11 ENCOUNTER — Other Ambulatory Visit: Payer: Self-pay | Admitting: Internal Medicine

## 2022-11-11 MED ORDER — TIRZEPATIDE 2.5 MG/0.5ML ~~LOC~~ SOAJ: 2.5000 mg | SUBCUTANEOUS | 3 refills | Status: DC

## 2022-11-11 NOTE — Progress Notes (Signed)
Subjective:    Patient ID: Yvonne Stephenson, female    DOB: 03/21/1966, 57 y.o.   MRN: [id number]  HPI Video virtual visit to discuss weight loss options Identification done Reviewed limitations and billing and she gave consent Participants---patient in her home and I am in my office  She is interested in injections to help loose weight Had back surgery in September---steroids, not able to exercise Then strained again at work--and needed more exercise Has gained weight and now BMI up to 37   Has friends who took mounjaro and did well on this  Current Outpatient Medications on File Prior to Visit  Medication Sig Dispense Refill   albuterol (PROVENTIL) (2.5 MG/3ML) 0.083% nebulizer solution INHALE 3 MLS (2.5 MG TOTAL) INTO THE LUNGS EVERY 6 (SIX) HOURS AS NEEDED. 150 mL 1   albuterol (VENTOLIN HFA) 108 (90 Base) MCG/ACT inhaler INHALE 2 PUFFS BY MOUTH EVERY 6 HOURS AS NEEDED FOR WHEEZE OR SHORTNESS OF BREATH 8.5 each 1   beclomethasone (QVAR REDIHALER) 40 MCG/ACT inhaler Inhale 2 puffs into the lungs 2 (two) times daily. 1 each 11   bisoprolol (ZEBETA) 5 MG tablet TAKE 1 TABLET (5 MG TOTAL) BY MOUTH DAILY. 90 tablet 0   EPINEPHrine 0.3 mg/0.3 mL IJ SOAJ injection Inject 0.3 mg into the muscle as needed for anaphylaxis. 1 each 1   fluticasone (FLONASE) 50 MCG/ACT nasal spray SPRAY 2 SPRAYS INTO EACH NOSTRIL EVERY DAY 16 mL 11   LORazepam (ATIVAN) 0.5 MG tablet Take 1 tablet (0.5 mg total) by mouth 2 (two) times daily as needed for anxiety. 30 tablet 0   omeprazole (PRILOSEC) 20 MG capsule Take 20 mg by mouth as needed.     Spacer/Aero-Holding Dorise Bullion Use as directed 1 Canister 0   vitamin B-12 (CYANOCOBALAMIN) 1000 MCG tablet Take 1,000 mcg by mouth daily.     No current facility-administered medications on file prior to visit.    Allergies  Allergen Reactions   Penicillins Shortness Of Breath    Has patient had a PCN reaction causing immediate rash, facial/tongue/throat  swelling, SOB or lightheadedness with hypotension: Yes Has patient had a PCN reaction causing severe rash involving mucus membranes or skin necrosis: No Has patient had a PCN reaction that required hospitalization: No Has patient had a PCN reaction occurring within the last 10 years: No If all of the above answers are "NO", then may proceed with Cephalosporin use.   REACTION: difficulty breathing   Shellfish Allergy Shortness Of Breath   Amoxicillin-Pot Clavulanate Swelling    Tongue swelling   Atorvastatin     REACTION: hip pain   Bee Venom Swelling    Shakes, chills , vomiting   Cefprozil     REACTION: rash   Demerol Nausea And Vomiting   Dilaudid [Hydromorphone Hcl] Nausea And Vomiting   Doxycycline Other (See Comments)    Severe Abdominal Pain   Meloxicam [Meloxicam] Swelling    Face swelling and lip tingling   Zithromax [Azithromycin]     REACTION: tongue felt funny    Past Medical History:  Diagnosis Date   Allergy    seasonal   Anemia    Anxiety    Asthma    only when gets a cold-expired inhaler   GERD (gastroesophageal reflux disease)    Hyperlipidemia    past hx   Hypertension    PONV (postoperative nausea and vomiting)     Past Surgical History:  Procedure Laterality Date   ABDOMINAL HYSTERECTOMY  07/2006   BREAST BIOPSY Left    BREAST EXCISIONAL BIOPSY Right    BREAST SURGERY  2004   LUMPECTOMY   CARPAL TUNNEL RELEASE  01/2009   LEFT (Dr. Tommie Raymond)   CESAREAN SECTION     3   COLONOSCOPY  2010   LAPAROSCOPY  04/09/2012   Procedure: LAPAROSCOPY OPERATIVE;  Surgeon: Donnamae Jude, MD;  Location: [facility];  Service: Gynecology;  Laterality: N/A;   NASAL STENOSIS REPAIR  2003   POLYPECTOMY     TA 2010   SALPINGOOPHORECTOMY  04/09/2012   Procedure: SALPINGO OOPHERECTOMY;  Surgeon: Donnamae Jude, MD;  Location: [facility];  Service: Gynecology;  Laterality: Right;   SHOULDER SURGERY Right 2016   spur on bone, states shoulder dislocated, [place]   TUBAL  LIGATION      Family History  Problem Relation Age of Onset   Heart attack Mother    Heart disease Mother    Hypertension Mother    Stroke Mother    Lung cancer Mother        smoked   Allergies Mother    Heart attack Father    Heart disease Father    Hypertension Father    Diabetes Maternal Uncle    Cancer Maternal Grandmother    Heart failure Maternal Grandfather    Heart failure Paternal Grandmother    Colon cancer Neg Hx    Colon polyps Neg Hx    Esophageal cancer Neg Hx    Rectal cancer Neg Hx    Stomach cancer Neg Hx     Social History   Socioeconomic History   Marital status: Married    Spouse name: Not on file   Number of children: 3   Years of education: Not on file   Highest education level: Not on file  Occupational History   Occupation: Optometrist    Comment: [employer] in [place]   Occupation:    Tobacco Use   Smoking status: Never   Smokeless tobacco: Never  Vaping Use   Vaping Use: Never used  Substance and Sexual Activity   Alcohol use: Yes    Comment: occasionaly, every few months   Drug use: No   Sexual activity: Not on file  Other Topics Concern   Not on file  Social History Narrative   Does walk a little for exercise   Social Determinants of Health   Financial Resource Strain: Not on file  Food Insecurity: Not on file  Transportation Needs: Not on file  Physical Activity: Not on file  Stress: Not on file  Social Connections: Not on file  Intimate Partner Violence: Not on file   Review of Systems No N/V Bowels move fine No history of pancreatitis     Objective:   Physical Exam Constitutional:      Appearance: Normal appearance.  Neurological:     Mental Status: She is alert.  Psychiatric:        Mood and Affect: Mood normal.        Behavior: Behavior normal.            Assessment & Plan:

## 2022-11-11 NOTE — Assessment & Plan Note (Signed)
Has BMI of 37 with known HTN, fatty liver, back problems, asthma, etc Has really had decline in fitness with the back surgery and now weight up more Agree that weight loss is critical Discussed need for some exercise--even if just chair exercises/resistance work Will try [employer] 2.'5mg'$  weekly--Rx sent If no GI problems after 4 weeks, will plan to increase to '5mg'$  (and even up to 7.'5mg'$  four weeks later) At that time, would have OV to review progress, etc Discussed potential serious GI side effects--like  pancreatitis. She will stop if sig issues

## 2022-11-14 ENCOUNTER — Other Ambulatory Visit (HOSPITAL_COMMUNITY): Payer: Self-pay

## 2022-11-14 ENCOUNTER — Telehealth: Payer: Self-pay | Admitting: Internal Medicine

## 2022-11-14 DIAGNOSIS — M48062 Spinal stenosis, lumbar region with neurogenic claudication: Secondary | ICD-10-CM | POA: Diagnosis not present

## 2022-11-14 NOTE — Telephone Encounter (Signed)
Pharmacy Patient Advocate Encounter   Received notification from [facility] that prior authorization for [facility] 2.'5MG'$ /0.5ML pen-injectors is required/requested.  Per Test Claim: Prior authorization required   PA submitted on 11/14/22 to (ins) [place] [employer] via CoverMyMeds Key [id number] Status is pending

## 2022-11-14 NOTE — Telephone Encounter (Signed)
Pt states she was told by her Pharmacy, [facility], that her insurance company, [employer], is requiring more info regarding the meds, mounjaro. Pt is asking if [place] received the response from the [facility]? Call back # [phone]

## 2022-11-15 NOTE — Telephone Encounter (Signed)
Pharmacy Patient Advocate Encounter  Received notification from [facility] that the request for prior authorization for Mounjaro 2.'5mg'$ /0.80m has been denied due to .    You may fax [phone] to appeal.  Please be advised we currently do not have a Pharmacist to review denials. If you would like [employer] submit it on your behalf, please provide clinical information to support your reason for appeal and any pertinent information you would like [employer] include with the appeal request. Appeals may take longer 5 business days to be submitted as we prepares necessary documentation. Thanks for your support.  How would you like to proceed?

## 2022-11-18 ENCOUNTER — Encounter: Payer: Self-pay | Admitting: Internal Medicine

## 2022-11-18 MED ORDER — SAXENDA 18 MG/3ML ~~LOC~~ SOPN: 0.6000 mg | PEN_INJECTOR | Freq: Every day | SUBCUTANEOUS | 3 refills | Status: DC

## 2022-11-18 NOTE — Telephone Encounter (Signed)
MyChart message sent to patient.

## 2022-11-20 ENCOUNTER — Other Ambulatory Visit: Payer: Self-pay | Admitting: Neurosurgery

## 2022-11-20 DIAGNOSIS — M48062 Spinal stenosis, lumbar region with neurogenic claudication: Secondary | ICD-10-CM

## 2022-11-21 ENCOUNTER — Other Ambulatory Visit (HOSPITAL_COMMUNITY): Payer: Self-pay

## 2022-11-21 NOTE — Telephone Encounter (Signed)
Patient Advocate Encounter   Received notification from [facility] that prior authorization for Saxenda is required.   PA submitted on 11/21/2022 Key [id number] Status is pending

## 2022-11-21 NOTE — Telephone Encounter (Signed)
Patient called and stated she was told the prior authorization has not been sent and requested a call back. Call back number [phone].

## 2022-11-25 NOTE — Telephone Encounter (Signed)
Pharmacy Patient Advocate Encounter  Prior Authorization for Kirke Shaggy has been approved.    PA# [id number] Effective dates: 11/21/2022 through 03/26/2023

## 2022-11-27 NOTE — Telephone Encounter (Signed)
Spoke to pt. Advised her that she can call around and see if she can find it somewhere and we can send it to that pharmacy.

## 2022-11-27 NOTE — Telephone Encounter (Signed)
Patient called and stated the medication Liraglutide -Weight Management (SAXENDA) 18 MG/3ML SOPN  is on back order and patient wanted to know if anything Dr. Silvio Pate can do. Call back number [phone].

## 2022-12-04 ENCOUNTER — Telehealth: Payer: BC Managed Care – PPO | Admitting: Physician Assistant

## 2022-12-04 ENCOUNTER — Encounter: Payer: Self-pay | Admitting: Internal Medicine

## 2022-12-04 DIAGNOSIS — U071 COVID-19: Secondary | ICD-10-CM | POA: Diagnosis not present

## 2022-12-04 MED ORDER — BENZONATATE 100 MG PO CAPS: 100.0000 mg | ORAL_CAPSULE | Freq: Three times a day (TID) | ORAL | 0 refills | Status: DC | PRN

## 2022-12-04 MED ORDER — MOLNUPIRAVIR EUA 200MG CAPSULE: 4.0000 | ORAL_CAPSULE | Freq: Two times a day (BID) | ORAL | 0 refills | Status: AC

## 2022-12-04 NOTE — Progress Notes (Signed)
Virtual Visit Consent   Yvonne Stephenson, you are scheduled for a virtual visit with a [facility] provider today. Just as with appointments in the office, your consent must be obtained to participate. Your consent will be active for this visit and any virtual visit you may have with one of our providers in the next 365 days. If you have a MyChart account, a copy of this consent can be sent to you electronically.  As this is a virtual visit, video technology does not allow for your provider to perform a traditional examination. This may limit your provider's ability to fully assess your condition. If your provider identifies any concerns that need to be evaluated in person or the need to arrange testing (such as labs, EKG, etc.), we will make arrangements to do so. Although advances in technology are sophisticated, we cannot ensure that it will always work on either your end or our end. If the connection with a video visit is poor, the visit may have to be switched to a telephone visit. With either a video or telephone visit, we are not always able to ensure that we have a secure connection.  By engaging in this virtual visit, you consent to the provision of healthcare and authorize for your insurance to be billed (if applicable) for the services provided during this visit. Depending on your insurance coverage, you may receive a charge related to this service.  I need to obtain your verbal consent now. Are you willing to proceed with your visit today? Yvonne Stephenson has provided verbal consent on 12/04/2022 for a virtual visit (video or telephone). [name], [place]  Date: 12/04/2022 4:47 PM  Virtual Visit via Video Note   I, [name], connected with  Yvonne Stephenson  ([id number], 30-Sep-1966) on 12/04/22 at  4:45 PM EST by a video-enabled telemedicine application and verified that I am speaking with the correct person using two identifiers.  Location: Patient: Virtual Visit Location  Patient: Home Provider: Virtual Visit Location Provider: Home Office   I discussed the limitations of evaluation and management by telemedicine and the availability of in person appointments. The patient expressed understanding and agreed to proceed.    History of Present Illness: Yvonne Stephenson is a 57 y.o. who identifies as a female who was assigned female at birth, and is being seen today for COVID-19. Symptoms starting Tuesday into yesterday with mild URI symptoms then sudden onset of body aches, fever, chills, fatigue. Notes cough is mainly dry but notes chest congestion. Denies SOB, chest pain. Is using her inhaler -- Qvar as directed.   Tested positive for COVID this morning.   MIL and husband both COVID positive.   OTC -- Coricidin HBP.   HPI: HPI  Problems:  Patient Active Problem List   Diagnosis Date Noted   Morbid obesity ([place]) 11/11/2022   Mild intermittent asthma 08/26/2016   Asthma with acute exacerbation 08/12/2016   Preventative health care 07/10/2012   Fatty infiltration of liver 01/14/2012   Hypertriglyceridemia 09/10/2007   Mild anxiety 09/10/2007   Essential hypertension, benign 09/10/2007   GERD 09/10/2007    Allergies:  Allergies  Allergen Reactions   Penicillins Shortness Of Breath    Has patient had a PCN reaction causing immediate rash, facial/tongue/throat swelling, SOB or lightheadedness with hypotension: Yes Has patient had a PCN reaction causing severe rash involving mucus membranes or skin necrosis: No Has patient had a PCN reaction that required hospitalization: No Has patient  had a PCN reaction occurring within the last 10 years: No If all of the above answers are "NO", then may proceed with Cephalosporin use.   REACTION: difficulty breathing   Shellfish Allergy Shortness Of Breath   Amoxicillin-Pot Clavulanate Swelling    Tongue swelling   Atorvastatin     REACTION: hip pain   Bee Venom Swelling    Shakes, chills , vomiting   Cefprozil      REACTION: rash   Demerol Nausea And Vomiting   Dilaudid [Hydromorphone Hcl] Nausea And Vomiting   Doxycycline Other (See Comments)    Severe Abdominal Pain   Meloxicam [Meloxicam] Swelling    Face swelling and lip tingling   Zithromax [Azithromycin]     REACTION: tongue felt funny   Medications:  Current Outpatient Medications:    benzonatate (TESSALON) 100 MG capsule, Take 1 capsule (100 mg total) by mouth 3 (three) times daily as needed for cough., Disp: 30 capsule, Rfl: 0   molnupiravir EUA (LAGEVRIO) 200 mg CAPS capsule, Take 4 capsules (800 mg total) by mouth 2 (two) times daily for 5 days., Disp: 40 capsule, Rfl: 0   albuterol (PROVENTIL) (2.5 MG/3ML) 0.083% nebulizer solution, INHALE 3 MLS (2.5 MG TOTAL) INTO THE LUNGS EVERY 6 (SIX) HOURS AS NEEDED., Disp: 150 mL, Rfl: 1   albuterol (VENTOLIN HFA) 108 (90 Base) MCG/ACT inhaler, INHALE 2 PUFFS BY MOUTH EVERY 6 HOURS AS NEEDED FOR WHEEZE OR SHORTNESS OF BREATH, Disp: 8.5 each, Rfl: 1   beclomethasone (QVAR REDIHALER) 40 MCG/ACT inhaler, Inhale 2 puffs into the lungs 2 (two) times daily., Disp: 1 each, Rfl: 11   bisoprolol (ZEBETA) 5 MG tablet, TAKE 1 TABLET (5 MG TOTAL) BY MOUTH DAILY., Disp: 90 tablet, Rfl: 0   EPINEPHrine 0.3 mg/0.3 mL IJ SOAJ injection, Inject 0.3 mg into the muscle as needed for anaphylaxis., Disp: 1 each, Rfl: 1   fluticasone (FLONASE) 50 MCG/ACT nasal spray, SPRAY 2 SPRAYS INTO EACH NOSTRIL EVERY DAY, Disp: 48 mL, Rfl: 3   Liraglutide -Weight Management (SAXENDA) 18 MG/3ML SOPN, Inject 0.6 mg into the skin daily. Increase to 1.'2mg'$  daily after 2 weeks (Patient not taking: Reported on 12/04/2022), Disp: 6 mL, Rfl: 3   LORazepam (ATIVAN) 0.5 MG tablet, Take 1 tablet (0.5 mg total) by mouth 2 (two) times daily as needed for anxiety., Disp: 30 tablet, Rfl: 0   omeprazole (PRILOSEC) 20 MG capsule, Take 20 mg by mouth as needed., Disp: , Rfl:    Spacer/Aero-Holding Chambers DEVI, Use as directed, Disp: 1 Canister, Rfl:  0   vitamin B-12 (CYANOCOBALAMIN) 1000 MCG tablet, Take 1,000 mcg by mouth daily., Disp: , Rfl:   Observations/Objective: Patient is well-developed, well-nourished in no acute distress.  Resting comfortably at home.  Head is normocephalic, atraumatic.  No labored breathing. Speech is clear and coherent with logical content.  Patient is alert and oriented at baseline.  Assessment and Plan: 1. COVID-19 - benzonatate (TESSALON) 100 MG capsule; Take 1 capsule (100 mg total) by mouth 3 (three) times daily as needed for cough.  Dispense: 30 capsule; Refill: 0 - molnupiravir EUA (LAGEVRIO) 200 mg CAPS capsule; Take 4 capsules (800 mg total) by mouth 2 (two) times daily for 5 days.  Dispense: 40 capsule; Refill: 0  Patient with multiple risk factors for complicated course of illness. Discussed risks/benefits of antiviral medications including most common potential ADRs. Patient voiced understanding and would like to proceed with antiviral medication. They are candidate for Molnupiravir. Rx sent to pharmacy.  Supportive measures, OTC medications and vitamin regimen reviewed. Tessalon per orders. Patient has been enrolled in a MyChart COVID symptom monitoring program. Samule Dry reviewed in detail. Strict ER precautions discussed with patient.    Follow Up Instructions: I discussed the assessment and treatment plan with the patient. The patient was provided an opportunity to ask questions and all were answered. The patient agreed with the plan and demonstrated an understanding of the instructions.  A copy of instructions were sent to the patient via MyChart unless otherwise noted below.   The patient was advised to call back or seek an in-person evaluation if the symptoms worsen or if the condition fails to improve as anticipated.  Time:  I spent 10 minutes with the patient via telehealth technology discussing the above problems/concerns.    [name], PA-C

## 2022-12-04 NOTE — Patient Instructions (Addendum)
Yvonne Stephenson, thank you for joining [name], PA-C for today's virtual visit.  While this provider is not your primary care provider (PCP), if your PCP is located in our provider database this encounter information will be shared with them immediately following your visit.   [facility] account gives you access to today's visit and all your visits, tests, and labs performed at [facility] " click here if you don't have a [facility] account or go to [email or web address]  Consent: (Patient) Yvonne Stephenson provided verbal consent for this virtual visit at the beginning of the encounter.  Current Medications:  Current Outpatient Medications:    albuterol (PROVENTIL) (2.5 MG/3ML) 0.083% nebulizer solution, INHALE 3 MLS (2.5 MG TOTAL) INTO THE LUNGS EVERY 6 (SIX) HOURS AS NEEDED., Disp: 150 mL, Rfl: 1   albuterol (VENTOLIN HFA) 108 (90 Base) MCG/ACT inhaler, INHALE 2 PUFFS BY MOUTH EVERY 6 HOURS AS NEEDED FOR WHEEZE OR SHORTNESS OF BREATH, Disp: 8.5 each, Rfl: 1   beclomethasone (QVAR REDIHALER) 40 MCG/ACT inhaler, Inhale 2 puffs into the lungs 2 (two) times daily., Disp: 1 each, Rfl: 11   bisoprolol (ZEBETA) 5 MG tablet, TAKE 1 TABLET (5 MG TOTAL) BY MOUTH DAILY., Disp: 90 tablet, Rfl: 0   EPINEPHrine 0.3 mg/0.3 mL IJ SOAJ injection, Inject 0.3 mg into the muscle as needed for anaphylaxis., Disp: 1 each, Rfl: 1   fluticasone (FLONASE) 50 MCG/ACT nasal spray, SPRAY 2 SPRAYS INTO EACH NOSTRIL EVERY DAY, Disp: 48 mL, Rfl: 3   Liraglutide -Weight Management (SAXENDA) 18 MG/3ML SOPN, Inject 0.6 mg into the skin daily. Increase to 1.'2mg'$  daily after 2 weeks, Disp: 6 mL, Rfl: 3   LORazepam (ATIVAN) 0.5 MG tablet, Take 1 tablet (0.5 mg total) by mouth 2 (two) times daily as needed for anxiety., Disp: 30 tablet, Rfl: 0   omeprazole (PRILOSEC) 20 MG capsule, Take 20 mg by mouth as needed., Disp: , Rfl:    Spacer/Aero-Holding Chambers DEVI, Use as directed,  Disp: 1 Canister, Rfl: 0   vitamin B-12 (CYANOCOBALAMIN) 1000 MCG tablet, Take 1,000 mcg by mouth daily., Disp: , Rfl:    Medications ordered in this encounter:  No orders of the defined types were placed in this encounter.    *If you need refills on other medications prior to your next appointment, please contact your pharmacy*  Follow-Up: Call back or seek an in-person evaluation if the symptoms worsen or if the condition fails to improve as anticipated.  [place] [phone]  Other Instructions Please keep well-hydrated and get plenty of rest. Start a saline nasal rinse to flush out your nasal passages. You can use plain Mucinex to help thin congestion. If you have a humidifier, running in the bedroom at night. I want you to start OTC vitamin D3 1000 units daily, vitamin C 1000 mg daily, and a zinc supplement. Please take prescribed medications as directed.  You were to quarantine for 5 days from onset of your symptoms.  After day 5, if you have had no fever and you are feeling better, you can end quarantine but need to mask for an additional 5 days. After day 5 if you have a fever or are having significant symptoms, please quarantine for full 10 days.  If you note any worsening of symptoms, any significant shortness of breath or any chest pain, please seek ER evaluation ASAP.  Please do not delay care!  COVID-19: What to Do if You Are  Sick If you test positive and are an older adult or someone who is at high risk of getting very sick from COVID-19, treatment may be available. Contact a healthcare provider right away after a positive test to determine if you are eligible, even if your symptoms are mild right now. You can also visit a Test to Treat location and, if eligible, receive a prescription from a provider. Don't delay: Treatment must be started within the first few days to be effective. If you have a fever, cough, or other symptoms, you might have COVID-19.  Most people have mild illness and are able to recover at home. If you are sick: Keep track of your symptoms. If you have an emergency warning sign (including trouble breathing), call 911. Steps to help prevent the spread of COVID-19 if you are sick If you are sick with COVID-19 or think you might have COVID-19, follow the steps below to care for yourself and to help protect other people in your home and community. Stay home except to get medical care Stay home. Most people with COVID-19 have mild illness and can recover at home without medical care. Do not leave your home, except to get medical care. Do not visit public areas and do not go to places where you are unable to wear a mask. Take care of yourself. Get rest and stay hydrated. Take over-the-counter medicines, such as acetaminophen, to help you feel better. Stay in touch with your doctor. Call before you get medical care. Be sure to get care if you have trouble breathing, or have any other emergency warning signs, or if you think it is an emergency. Avoid public transportation, ride-sharing, or taxis if possible. Get tested If you have symptoms of COVID-19, get tested. While waiting for test results, stay away from others, including staying apart from those living in your household. Get tested as soon as possible after your symptoms start. Treatments may be available for people with COVID-19 who are at risk for becoming very sick. Don't delay: Treatment must be started early to be effective--some treatments must begin within 5 days of your first symptoms. Contact your healthcare provider right away if your test result is positive to determine if you are eligible. Self-tests are one of several options for testing for the virus that causes COVID-19 and may be more convenient than laboratory-based tests and point-of-care tests. Ask your healthcare provider or your local health department if you need help interpreting your test results. You can  visit your state, tribal, local, and territorial health department's website to look for the latest local information on testing sites. Separate yourself from other people As much as possible, stay in a specific room and away from other people and pets in your home. If possible, you should use a separate bathroom. If you need to be around other people or animals in or outside of the home, wear a well-fitting mask. Tell your close contacts that they may have been exposed to COVID-19. An infected person can spread COVID-19 starting 48 hours (or 2 days) before the person has any symptoms or tests positive. By letting your close contacts know they may have been exposed to COVID-19, you are helping to protect everyone. See COVID-19 and Animals if you have questions about pets. If you are diagnosed with COVID-19, someone from the health department may call you. Answer the call to slow the spread. Monitor your symptoms Symptoms of COVID-19 include fever, cough, or other symptoms. Follow care instructions from your  healthcare provider and local health department. Your local health authorities may give instructions on checking your symptoms and reporting information. When to seek emergency medical attention Look for emergency warning signs* for COVID-19. If someone is showing any of these signs, seek emergency medical care immediately: Trouble breathing Persistent pain or pressure in the chest New confusion Inability to wake or stay awake Pale, gray, or blue-colored skin, lips, or nail beds, depending on skin tone *This list is not all possible symptoms. Please call your medical provider for any other symptoms that are severe or concerning to you. Call 911 or call ahead to your local emergency facility: Notify the operator that you are seeking care for someone who has or may have COVID-19. Call ahead before visiting your doctor Call ahead. Many medical visits for routine care are being postponed or done by  phone or telemedicine. If you have a medical appointment that cannot be postponed, call your doctor's office, and tell them you have or may have COVID-19. This will help the office protect themselves and other patients. If you are sick, wear a well-fitting mask You should wear a mask if you must be around other people or animals, including pets (even at home). Wear a mask with the best fit, protection, and comfort for you. You don't need to wear the mask if you are alone. If you can't put on a mask (because of trouble breathing, for example), cover your coughs and sneezes in some other way. Try to stay at least 6 feet away from other people. This will help protect the people around you. Masks should not be placed on young children under age 60 years, anyone who has trouble breathing, or anyone who is not able to remove the mask without help. Cover your coughs and sneezes Cover your mouth and nose with a tissue when you cough or sneeze. Throw away used tissues in a lined trash can. Immediately wash your hands with soap and water for at least 20 seconds. If soap and water are not available, clean your hands with an alcohol-based hand sanitizer that contains at least 60% alcohol. Clean your hands often Wash your hands often with soap and water for at least 20 seconds. This is especially important after blowing your nose, coughing, or sneezing; going to the bathroom; and before eating or preparing food. Use hand sanitizer if soap and water are not available. Use an alcohol-based hand sanitizer with at least 60% alcohol, covering all surfaces of your hands and rubbing them together until they feel dry. Soap and water are the best option, especially if hands are visibly dirty. Avoid touching your eyes, nose, and mouth with unwashed hands. Handwashing Tips Avoid sharing personal household items Do not share dishes, drinking glasses, cups, eating utensils, towels, or bedding with other people in your  home. Wash these items thoroughly after using them with soap and water or put in the dishwasher. Clean surfaces in your home regularly Clean and disinfect high-touch surfaces (for example, doorknobs, tables, handles, light switches, and countertops) in your "sick room" and bathroom. In shared spaces, you should clean and disinfect surfaces and items after each use by the person who is ill. If you are sick and cannot clean, a caregiver or other person should only clean and disinfect the area around you (such as your bedroom and bathroom) on an as needed basis. Your caregiver/other person should wait as long as possible (at least several hours) and wear a mask before entering, cleaning, and disinfecting  shared spaces that you use. Clean and disinfect areas that may have blood, stool, or body fluids on them. Use household cleaners and disinfectants. Clean visible dirty surfaces with household cleaners containing soap or detergent. Then, use a household disinfectant. Use a product from [employer] List N: Disinfectants for Coronavirus (VCBSW-96). Be sure to follow the instructions on the label to ensure safe and effective use of the product. Many products recommend keeping the surface wet with a disinfectant for a certain period of time (look at "contact time" on the product label). You may also need to wear personal protective equipment, such as gloves, depending on the directions on the product label. Immediately after disinfecting, wash your hands with soap and water for 20 seconds. For completed guidance on cleaning and disinfecting your home, visit Complete Disinfection Guidance. Take steps to improve ventilation at home Improve ventilation (air flow) at home to help prevent from spreading COVID-19 to other people in your household. Clear out COVID-19 virus particles in the air by opening windows, using air filters, and turning on fans in your home. Use this interactive tool to learn how to improve air  flow in your home. When you can be around others after being sick with COVID-19 Deciding when you can be around others is different for different situations. Find out when you can safely end home isolation. For any additional questions about your care, contact your healthcare provider or state or local health department. 01/16/2021 Content source: [facility] ([facility]), Division of Viral Diseases This information is not intended to replace advice given to you by your health care provider. Make sure you discuss any questions you have with your health care provider. Document Revised: 03/01/2021 Document Reviewed: 03/01/2021 Elsevier Patient Education  2022 [employer].  If you have been instructed to have an in-person evaluation today at a local Urgent Care facility, please use the link below. It will take you to a list of all of our available [facility], including address, phone number and hours of operation. Please do not delay care.  [facility]  If you or a family member do not have a primary care provider, use the link below to schedule a visit and establish care. When you choose a [facility] primary care physician or advanced practice provider, you gain a long-term partner in health. Find a Primary Care Provider  Learn more about [facility]'s in-office and virtual care options: [facility]

## 2022-12-09 ENCOUNTER — Encounter: Payer: Self-pay | Admitting: Internal Medicine

## 2022-12-09 ENCOUNTER — Other Ambulatory Visit: Payer: Self-pay

## 2022-12-09 MED ORDER — INSULIN PEN NEEDLE 31G X 5 MM MISC
0 refills | Status: DC
  Filled 2022-12-09: qty 100, 90d supply, fill #0

## 2022-12-09 MED ORDER — SAXENDA 18 MG/3ML ~~LOC~~ SOPN
0.6000 mg | PEN_INJECTOR | Freq: Every day | SUBCUTANEOUS | 3 refills | Status: DC
  Filled 2022-12-09: qty 15, 30d supply, fill #0
  Filled 2023-01-01: qty 15, 30d supply, fill #1
  Filled 2023-01-28: qty 15, 30d supply, fill #2

## 2022-12-14 ENCOUNTER — Ambulatory Visit
Admission: RE | Admit: 2022-12-14 | Discharge: 2022-12-14 | Disposition: A | Discharge status: Home / Self Care | Payer: BC Managed Care – PPO | Source: Ambulatory Visit | Attending: Neurosurgery | Admitting: Neurosurgery

## 2022-12-14 DIAGNOSIS — M4186 Other forms of scoliosis, lumbar region: Secondary | ICD-10-CM | POA: Diagnosis not present

## 2022-12-14 DIAGNOSIS — M48062 Spinal stenosis, lumbar region with neurogenic claudication: Secondary | ICD-10-CM

## 2022-12-14 DIAGNOSIS — M5116 Intervertebral disc disorders with radiculopathy, lumbar region: Secondary | ICD-10-CM | POA: Diagnosis not present

## 2022-12-14 MED ORDER — GADOPICLENOL 0.5 MMOL/ML IV SOLN
9.0000 mL | Freq: Once | INTRAVENOUS | Status: AC | PRN
  Administered 2022-12-14: 9 mL via INTRAVENOUS

## 2022-12-17 ENCOUNTER — Encounter: Payer: Self-pay | Admitting: Internal Medicine

## 2022-12-17 MED ORDER — ONDANSETRON 4 MG PO TBDP: 4.0000 mg | ORAL_TABLET | Freq: Three times a day (TID) | ORAL | 0 refills | Status: DC | PRN

## 2022-12-18 DIAGNOSIS — M48062 Spinal stenosis, lumbar region with neurogenic claudication: Secondary | ICD-10-CM | POA: Diagnosis not present

## 2023-01-01 ENCOUNTER — Telehealth: Payer: BC Managed Care – PPO | Admitting: Physician Assistant

## 2023-01-01 ENCOUNTER — Other Ambulatory Visit: Payer: Self-pay

## 2023-01-01 DIAGNOSIS — J029 Acute pharyngitis, unspecified: Secondary | ICD-10-CM | POA: Diagnosis not present

## 2023-01-01 MED ORDER — CLINDAMYCIN HCL 300 MG PO CAPS: 300.0000 mg | ORAL_CAPSULE | Freq: Three times a day (TID) | ORAL | 0 refills | Status: AC

## 2023-01-01 NOTE — Progress Notes (Signed)
Virtual Visit Consent   Yvonne Stephenson, you are scheduled for a virtual visit with a [facility] provider today. Just as with appointments in the office, your consent must be obtained to participate. Your consent will be active for this visit and any virtual visit you may have with one of our providers in the next 365 days. If you have a MyChart account, a copy of this consent can be sent to you electronically.  As this is a virtual visit, video technology does not allow for your provider to perform a traditional examination. This may limit your provider's ability to fully assess your condition. If your provider identifies any concerns that need to be evaluated in person or the need to arrange testing (such as labs, EKG, etc.), we will make arrangements to do so. Although advances in technology are sophisticated, we cannot ensure that it will always work on either your end or our end. If the connection with a video visit is poor, the visit may have to be switched to a telephone visit. With either a video or telephone visit, we are not always able to ensure that we have a secure connection.  By engaging in this virtual visit, you consent to the provision of healthcare and authorize for your insurance to be billed (if applicable) for the services provided during this visit. Depending on your insurance coverage, you may receive a charge related to this service.  I need to obtain your verbal consent now. Are you willing to proceed with your visit today? Yvonne Stephenson has provided verbal consent on 01/01/2023 for a virtual visit (video or telephone). Yvonne Stephenson, [place]  Date: 01/01/2023 8:49 AM  Virtual Visit via Video Note   I, Yvonne Stephenson, connected with  Yvonne Stephenson  ([id number], 15-Jan-1966) on 01/01/23 at  8:30 AM EST by a video-enabled telemedicine application and verified that I am speaking with the correct person using two identifiers.  Location: Patient: Virtual Visit Location  Patient: Home Provider: Virtual Visit Location Provider: Home Office   I discussed the limitations of evaluation and management by telemedicine and the availability of in person appointments. The patient expressed understanding and agreed to proceed.    History of Present Illness: Yvonne Stephenson is a 57 y.o. who identifies as a female who was assigned female at birth, and is being seen today for 3 days of significant sore throat, worsening since onset. Denies fever. Some chills and aches yesterday only. Mild nasal congestion but no chest congestion or cough. Denies recent travel. Some exposures to strep at school. Had the school nurse take a look and tonsils were swollen.  OTC -- Tylenol/Ibuprofen  HPI: HPI  Problems:  Patient Active Problem List   Diagnosis Date Noted   Morbid obesity ([place]) 11/11/2022   Mild intermittent asthma 08/26/2016   Asthma with acute exacerbation 08/12/2016   Preventative health care 07/10/2012   Fatty infiltration of liver 01/14/2012   Hypertriglyceridemia 09/10/2007   Mild anxiety 09/10/2007   Essential hypertension, benign 09/10/2007   GERD 09/10/2007    Allergies:  Allergies  Allergen Reactions   Penicillins Shortness Of Breath    Has patient had a PCN reaction causing immediate rash, facial/tongue/throat swelling, SOB or lightheadedness with hypotension: Yes Has patient had a PCN reaction causing severe rash involving mucus membranes or skin necrosis: No Has patient had a PCN reaction that required hospitalization: No Has patient had a PCN reaction occurring within the last 10 years: No If all  of the above answers are "NO", then may proceed with Cephalosporin use.   REACTION: difficulty breathing   Shellfish Allergy Shortness Of Breath   Amoxicillin-Pot Clavulanate Swelling    Tongue swelling   Atorvastatin     REACTION: hip pain   Bee Venom Swelling    Shakes, chills , vomiting   Cefprozil     REACTION: rash   Demerol Nausea And Vomiting    Dilaudid [Hydromorphone Hcl] Nausea And Vomiting   Doxycycline Other (See Comments)    Severe Abdominal Pain   Meloxicam [Meloxicam] Swelling    Face swelling and lip tingling   Zithromax [Azithromycin]     REACTION: tongue felt funny   Medications:  Current Outpatient Medications:    clindamycin (CLEOCIN) 300 MG capsule, Take 1 capsule (300 mg total) by mouth 3 (three) times daily for 10 days., Disp: 30 capsule, Rfl: 0   albuterol (PROVENTIL) (2.5 MG/3ML) 0.083% nebulizer solution, INHALE 3 MLS (2.5 MG TOTAL) INTO THE LUNGS EVERY 6 (SIX) HOURS AS NEEDED., Disp: 150 mL, Rfl: 1   albuterol (VENTOLIN HFA) 108 (90 Base) MCG/ACT inhaler, INHALE 2 PUFFS BY MOUTH EVERY 6 HOURS AS NEEDED FOR WHEEZE OR SHORTNESS OF BREATH, Disp: 8.5 each, Rfl: 1   beclomethasone (QVAR REDIHALER) 40 MCG/ACT inhaler, Inhale 2 puffs into the lungs 2 (two) times daily., Disp: 1 each, Rfl: 11   benzonatate (TESSALON) 100 MG capsule, Take 1 capsule (100 mg total) by mouth 3 (three) times daily as needed for cough., Disp: 30 capsule, Rfl: 0   bisoprolol (ZEBETA) 5 MG tablet, TAKE 1 TABLET (5 MG TOTAL) BY MOUTH DAILY., Disp: 90 tablet, Rfl: 0   EPINEPHrine 0.3 mg/0.3 mL IJ SOAJ injection, Inject 0.3 mg into the muscle as needed for anaphylaxis., Disp: 1 each, Rfl: 1   fluticasone (FLONASE) 50 MCG/ACT nasal spray, SPRAY 2 SPRAYS INTO EACH NOSTRIL EVERY DAY, Disp: 48 mL, Rfl: 3   Insulin Pen Needle 31G X 5 MM MISC, use as directed with Saxenda, Disp: 100 each, Rfl: 0   Liraglutide -Weight Management (SAXENDA) 18 MG/3ML SOPN, Inject 0.6 mg into the skin daily. Increase to 1.'2mg'$  daily after 2 weeks, titrate as directed, Disp: 15 mL, Rfl: 3   LORazepam (ATIVAN) 0.5 MG tablet, Take 1 tablet (0.5 mg total) by mouth 2 (two) times daily as needed for anxiety., Disp: 30 tablet, Rfl: 0   omeprazole (PRILOSEC) 20 MG capsule, Take 20 mg by mouth as needed., Disp: , Rfl:    ondansetron (ZOFRAN-ODT) 4 MG disintegrating tablet, Take 1  tablet (4 mg total) by mouth every 8 (eight) hours as needed for nausea or vomiting., Disp: 60 tablet, Rfl: 0   Spacer/Aero-Holding Chambers DEVI, Use as directed, Disp: 1 Canister, Rfl: 0   vitamin B-12 (CYANOCOBALAMIN) 1000 MCG tablet, Take 1,000 mcg by mouth daily., Disp: , Rfl:   Observations/Objective: Patient is well-developed, well-nourished in no acute distress.  Resting comfortably at home.  Head is normocephalic, atraumatic.  No labored breathing. Speech is clear and coherent with logical content.  Patient is alert and oriented at baseline.  Left tonsillar swelling with mid exudate. Bilateral oropharyngeal erythema without edema. Uvula midline without lesion.   Assessment and Plan: 1. Exudative pharyngitis - clindamycin (CLEOCIN) 300 MG capsule; Take 1 capsule (300 mg total) by mouth 3 (three) times daily for 10 days.  Dispense: 30 capsule; Refill: 0  Concern for strep giving exposures and current symptoms. She is allergic to penicillins (SOB/anaphylactic), cephalosporins, azithromycin. Doxy causes severe  abdominal pain per her records. Will have to start Clindamycin. Supportive measures and OTC medications reviewed. Start daily probiotic. Follow-up with PCP if not resolving.   Follow Up Instructions: I discussed the assessment and treatment plan with the patient. The patient was provided an opportunity to ask questions and all were answered. The patient agreed with the plan and demonstrated an understanding of the instructions.  A copy of instructions were sent to the patient via MyChart unless otherwise noted below.   The patient was advised to call back or seek an in-person evaluation if the symptoms worsen or if the condition fails to improve as anticipated.  Time:  I spent 10 minutes with the patient via telehealth technology discussing the above problems/concerns.    Yvonne Rio, PA-C

## 2023-01-01 NOTE — Patient Instructions (Signed)
Yvonne Stephenson, thank you for joining [name], PA-C for today's virtual visit.  While this provider is not your primary care provider (PCP), if your PCP is located in our provider database this encounter information will be shared with them immediately following your visit.   [facility] account gives you access to today's visit and all your visits, tests, and labs performed at [facility] " click here if you don't have a [facility] account or go to [email or web address]  Consent: (Patient) Yvonne Stephenson provided verbal consent for this virtual visit at the beginning of the encounter.  Current Medications:  Current Outpatient Medications:    clindamycin (CLEOCIN) 300 MG capsule, Take 1 capsule (300 mg total) by mouth 3 (three) times daily for 10 days., Disp: 30 capsule, Rfl: 0   albuterol (PROVENTIL) (2.5 MG/3ML) 0.083% nebulizer solution, INHALE 3 MLS (2.5 MG TOTAL) INTO THE LUNGS EVERY 6 (SIX) HOURS AS NEEDED., Disp: 150 mL, Rfl: 1   albuterol (VENTOLIN HFA) 108 (90 Base) MCG/ACT inhaler, INHALE 2 PUFFS BY MOUTH EVERY 6 HOURS AS NEEDED FOR WHEEZE OR SHORTNESS OF BREATH, Disp: 8.5 each, Rfl: 1   beclomethasone (QVAR REDIHALER) 40 MCG/ACT inhaler, Inhale 2 puffs into the lungs 2 (two) times daily., Disp: 1 each, Rfl: 11   benzonatate (TESSALON) 100 MG capsule, Take 1 capsule (100 mg total) by mouth 3 (three) times daily as needed for cough., Disp: 30 capsule, Rfl: 0   bisoprolol (ZEBETA) 5 MG tablet, TAKE 1 TABLET (5 MG TOTAL) BY MOUTH DAILY., Disp: 90 tablet, Rfl: 0   EPINEPHrine 0.3 mg/0.3 mL IJ SOAJ injection, Inject 0.3 mg into the muscle as needed for anaphylaxis., Disp: 1 each, Rfl: 1   fluticasone (FLONASE) 50 MCG/ACT nasal spray, SPRAY 2 SPRAYS INTO EACH NOSTRIL EVERY DAY, Disp: 48 mL, Rfl: 3   Insulin Pen Needle 31G X 5 MM MISC, use as directed with Saxenda, Disp: 100 each, Rfl: 0   Liraglutide -Weight Management (SAXENDA) 18 MG/3ML  SOPN, Inject 0.6 mg into the skin daily. Increase to 1.'2mg'$  daily after 2 weeks, titrate as directed, Disp: 15 mL, Rfl: 3   LORazepam (ATIVAN) 0.5 MG tablet, Take 1 tablet (0.5 mg total) by mouth 2 (two) times daily as needed for anxiety., Disp: 30 tablet, Rfl: 0   omeprazole (PRILOSEC) 20 MG capsule, Take 20 mg by mouth as needed., Disp: , Rfl:    ondansetron (ZOFRAN-ODT) 4 MG disintegrating tablet, Take 1 tablet (4 mg total) by mouth every 8 (eight) hours as needed for nausea or vomiting., Disp: 60 tablet, Rfl: 0   Spacer/Aero-Holding Chambers DEVI, Use as directed, Disp: 1 Canister, Rfl: 0   vitamin B-12 (CYANOCOBALAMIN) 1000 MCG tablet, Take 1,000 mcg by mouth daily., Disp: , Rfl:    Medications ordered in this encounter:  Meds ordered this encounter  Medications   clindamycin (CLEOCIN) 300 MG capsule    Sig: Take 1 capsule (300 mg total) by mouth 3 (three) times daily for 10 days.    Dispense:  30 capsule    Refill:  0    Order Specific Question:   Supervising Provider    Answer:   Chase Picket [id number]     *If you need refills on other medications prior to your next appointment, please contact your pharmacy*  Follow-Up: Call back or seek an in-person evaluation if the symptoms worsen or if the condition fails to improve as anticipated.  [address] [phone]  Other Instructions  Based on what you have shared with me it is likely that you have strep pharyngitis.  Strep pharyngitis is inflammation and infection in the back of the throat.  This is an infection cause by bacteria and is treated with antibiotics.  I have prescribed Clindamycin 300 mg three times a day for 10 days. For throat pain, we recommend over the counter oral pain relief medications such as acetaminophen or aspirin, or anti-inflammatory medications such as ibuprofen or naproxen sodium. Topical treatments such as oral throat lozenges or sprays may be used as needed. Strep infections are not as  easily transmitted as other respiratory infections, however we still recommend that you avoid close contact with loved ones, especially the very young and elderly.  Remember to wash your hands thoroughly throughout the day as this is the number one way to prevent the spread of infection and wipe down door knobs and counters with disinfectant.   Home Care: Only take medications as instructed by your medical team. Complete the entire course of an antibiotic. Do not take these medications with alcohol. A steam or ultrasonic humidifier can help congestion.  You can place a towel over your head and breathe in the steam from hot water coming from a faucet. Avoid close contacts especially the very young and the elderly. Cover your mouth when you cough or sneeze. Always remember to wash your hands.  Get Help Right Away If: You develop worsening fever or sinus pain. You develop a severe head ache or visual changes. Your symptoms persist after you have completed your treatment plan.  Make sure you Understand these instructions. Will watch your condition. Will get help right away if you are not doing well or get worse.      If you have been instructed to have an in-person evaluation today at a local Urgent Care facility, please use the link below. It will take you to a list of all of our available [facility], including address, phone number and hours of operation. Please do not delay care.  [facility]  If you or a family member do not have a primary care provider, use the link below to schedule a visit and establish care. When you choose a [facility] primary care physician or advanced practice provider, you gain a long-term partner in health. Find a Primary Care Provider  Learn more about [facility]'s in-office and virtual care options: [facility]

## 2023-01-03 ENCOUNTER — Other Ambulatory Visit: Payer: Self-pay

## 2023-01-09 DIAGNOSIS — M25551 Pain in right hip: Secondary | ICD-10-CM | POA: Diagnosis not present

## 2023-01-24 ENCOUNTER — Encounter: Payer: Managed Care, Other (non HMO) | Admitting: Internal Medicine

## 2023-01-28 ENCOUNTER — Other Ambulatory Visit: Payer: Self-pay | Admitting: Internal Medicine

## 2023-01-29 ENCOUNTER — Other Ambulatory Visit: Payer: Self-pay | Admitting: Internal Medicine

## 2023-01-29 ENCOUNTER — Other Ambulatory Visit: Payer: Self-pay

## 2023-01-29 MED ORDER — OMEPRAZOLE 20 MG PO CPDR: 20.0000 mg | DELAYED_RELEASE_CAPSULE | ORAL | 1 refills | Status: DC | PRN

## 2023-01-29 MED FILL — Insulin Pen Needle 31 G X 5 MM (1/5" or 3/16"): 90 days supply | Qty: 100 | Fill #0 | Status: CN

## 2023-01-31 ENCOUNTER — Other Ambulatory Visit: Payer: Self-pay

## 2023-01-31 ENCOUNTER — Other Ambulatory Visit: Payer: Self-pay | Admitting: Internal Medicine

## 2023-02-01 MED FILL — Insulin Pen Needle 31 G X 5 MM (1/5" or 3/16"): Qty: 100 | Fill #0 | Status: CN

## 2023-02-02 ENCOUNTER — Other Ambulatory Visit: Payer: Self-pay

## 2023-02-03 ENCOUNTER — Other Ambulatory Visit (HOSPITAL_COMMUNITY): Payer: Self-pay

## 2023-02-03 ENCOUNTER — Other Ambulatory Visit: Payer: Self-pay

## 2023-02-03 MED ORDER — TIRZEPATIDE 5 MG/0.5ML ~~LOC~~ SOAJ
5.0000 mg | SUBCUTANEOUS | 1 refills | Status: DC
  Filled 2023-02-03: qty 2, 28d supply, fill #0

## 2023-02-04 ENCOUNTER — Other Ambulatory Visit (HOSPITAL_COMMUNITY): Payer: Self-pay

## 2023-02-04 ENCOUNTER — Other Ambulatory Visit: Payer: Self-pay

## 2023-02-07 ENCOUNTER — Telehealth: Payer: Self-pay | Admitting: Internal Medicine

## 2023-02-07 ENCOUNTER — Other Ambulatory Visit (HOSPITAL_COMMUNITY): Payer: Self-pay

## 2023-02-07 ENCOUNTER — Other Ambulatory Visit: Payer: Self-pay | Admitting: Internal Medicine

## 2023-02-07 NOTE — Telephone Encounter (Signed)
Sent patient a MyChart message

## 2023-02-07 NOTE — Telephone Encounter (Signed)
So I would guess we need to look at [facility]. Will forward to Dr Alphonsus Sias to see what he wants to do.

## 2023-02-07 NOTE — Telephone Encounter (Signed)
Patient is needing a prior auth for her tirzepatide [facility]) 5 MG/0.5ML Pen. Thank you!

## 2023-02-07 NOTE — Telephone Encounter (Signed)
Yvonne Stephenson is only approved for type 2 diabetes. No documented indication of T2DM on patient's chart. Please advise.

## 2023-02-10 ENCOUNTER — Other Ambulatory Visit (HOSPITAL_COMMUNITY): Payer: Self-pay

## 2023-02-10 MED ORDER — SEMAGLUTIDE-WEIGHT MANAGEMENT 0.25 MG/0.5ML ~~LOC~~ SOAJ: 0.2500 mg | SUBCUTANEOUS | 1 refills | Status: DC

## 2023-02-10 NOTE — Addendum Note (Signed)
Addended by: Eual Fines on: 02/10/2023 08:51 AM   Modules accepted: Orders

## 2023-02-17 ENCOUNTER — Telehealth: Payer: Self-pay | Admitting: Internal Medicine

## 2023-02-17 NOTE — Telephone Encounter (Signed)
Enrique Sack from [facility] called stating that a prior Berkley Harvey is needed for medication Semaglutide-Weight Management 0.25 MG/0.5ML SOAJ .   Pharmacy Intake number:[phone]

## 2023-02-18 ENCOUNTER — Other Ambulatory Visit (HOSPITAL_COMMUNITY): Payer: Self-pay

## 2023-02-18 ENCOUNTER — Telehealth: Payer: Self-pay

## 2023-02-18 NOTE — Telephone Encounter (Signed)
PA submitted via CMM. Key: [id number]. Status pending.

## 2023-02-18 NOTE — Telephone Encounter (Signed)
Pharmacy Patient Advocate Encounter   Received notification from [facility] that prior authorization for [facility] 0.25MG /0.5ML auto-injectors is required/requested.  Per Test Claim: PA required   PA submitted on 02/18/23 to (ins) [employer] via [employer] or [facility]) confirmation # [id number] Status is pending

## 2023-02-19 DIAGNOSIS — M48062 Spinal stenosis, lumbar region with neurogenic claudication: Secondary | ICD-10-CM | POA: Diagnosis not present

## 2023-02-19 DIAGNOSIS — Z6835 Body mass index (BMI) 35.0-35.9, adult: Secondary | ICD-10-CM | POA: Diagnosis not present

## 2023-02-21 ENCOUNTER — Other Ambulatory Visit (HOSPITAL_COMMUNITY): Payer: Self-pay

## 2023-02-21 NOTE — Telephone Encounter (Signed)
PA approved. Effective dates: 02/18/23 through 06/24/23

## 2023-02-21 NOTE — Telephone Encounter (Signed)
Patient Advocate Encounter  Prior Authorization for  [employer] 0.25MG /0.5ML  has been approved with [employer].    Per [facility] test claim, copay for 28 days supply is $24.99  PA# [id number] Effective dates: 02/18/23 through 06/24/23

## 2023-02-23 ENCOUNTER — Other Ambulatory Visit: Payer: Self-pay | Admitting: Internal Medicine

## 2023-02-25 ENCOUNTER — Telehealth: Payer: BC Managed Care – PPO | Admitting: Physician Assistant

## 2023-02-25 DIAGNOSIS — R197 Diarrhea, unspecified: Secondary | ICD-10-CM

## 2023-02-25 MED ORDER — DICYCLOMINE HCL 10 MG PO CAPS: 10.0000 mg | ORAL_CAPSULE | Freq: Three times a day (TID) | ORAL | 0 refills | Status: DC

## 2023-02-25 NOTE — Progress Notes (Signed)
Virtual Visit Consent   [name], you are scheduled for a virtual visit with a [facility] provider today. Just as with appointments in the office, your consent must be obtained to participate. Your consent will be active for this visit and any virtual visit you may have with one of our providers in the next 365 days. If you have a MyChart account, a copy of this consent can be sent to you electronically.  As this is a virtual visit, video technology does not allow for your provider to perform a traditional examination. This may limit your provider's ability to fully assess your condition. If your provider identifies any concerns that need to be evaluated in person or the need to arrange testing (such as labs, EKG, etc.), we will make arrangements to do so. Although advances in technology are sophisticated, we cannot ensure that it will always work on either your end or our end. If the connection with a video visit is poor, the visit may have to be switched to a telephone visit. With either a video or telephone visit, we are not always able to ensure that we have a secure connection.  By engaging in this virtual visit, you consent to the provision of healthcare and authorize for your insurance to be billed (if applicable) for the services provided during this visit. Depending on your insurance coverage, you may receive a charge related to this service.  I need to obtain your verbal consent now. Are you willing to proceed with your visit today? Yvonne Stephenson has provided verbal consent on 02/25/2023 for a virtual visit (video or telephone). Margaretann Loveless, PA-C  Date: 02/25/2023 9:51 AM  Virtual Visit via Video Note   I, Margaretann Loveless, connected with  Yvonne Stephenson  ([id number], [birth year]) on 02/25/23 at  9:45 AM EDT by a video-enabled telemedicine application and verified that I am speaking with the correct person using two identifiers.  Location: Patient: Virtual Visit  Location Patient: Home Provider: Virtual Visit Location Provider: Home Office   I discussed the limitations of evaluation and management by telemedicine and the availability of in person appointments. The patient expressed understanding and agreed to proceed.    History of Present Illness: Yvonne Stephenson is a 57 y.o. who identifies as a female who was assigned female at birth, and is being seen today for diarrhea.  HPI: Diarrhea  This is a new problem. The current episode started in the past 7 days (Wednesday, 02/19/23; worsened yesterday and overnight). The problem occurs 5 to 10 times per day. The problem has been gradually worsening. The stool consistency is described as Watery. The patient states that diarrhea awakens her from sleep. Associated symptoms include abdominal pain (cramping), bloating and increased flatus. Pertinent negatives include no chills, fever, myalgias, sweats or vomiting. Nothing aggravates the symptoms. Risk factors: started new protein bar with fiber. She has tried anti-motility drug for the symptoms. The treatment provided mild relief. Her past medical history is significant for irritable bowel syndrome.     Problems:  Patient Active Problem List   Diagnosis Date Noted   Morbid obesity ([facility]) 11/11/2022   Mild intermittent asthma 08/26/2016   Asthma with acute exacerbation 08/12/2016   Preventative health care 07/10/2012   Fatty infiltration of liver 01/14/2012   Hypertriglyceridemia 09/10/2007   Mild anxiety 09/10/2007   Essential hypertension, benign 09/10/2007   GERD 09/10/2007    Allergies:  Allergies  Allergen Reactions   Penicillins Shortness Of Breath  Has patient had a PCN reaction causing immediate rash, facial/tongue/throat swelling, SOB or lightheadedness with hypotension: Yes Has patient had a PCN reaction causing severe rash involving mucus membranes or skin necrosis: No Has patient had a PCN reaction that required hospitalization: No Has  patient had a PCN reaction occurring within the last 10 years: No If all of the above answers are "NO", then may proceed with Cephalosporin use.   REACTION: difficulty breathing   Shellfish Allergy Shortness Of Breath   Amoxicillin-Pot Clavulanate Swelling    Tongue swelling   Atorvastatin     REACTION: hip pain   Bee Venom Swelling    Shakes, chills , vomiting   Cefprozil     REACTION: rash   Demerol Nausea And Vomiting   Dilaudid [Hydromorphone Hcl] Nausea And Vomiting   Doxycycline Other (See Comments)    Severe Abdominal Pain   Meloxicam [Meloxicam] Swelling    Face swelling and lip tingling   Zithromax [Azithromycin]     REACTION: tongue felt funny   Medications:  Current Outpatient Medications:    dicyclomine (BENTYL) 10 MG capsule, Take 1 capsule (10 mg total) by mouth 4 (four) times daily -  before meals and at bedtime., Disp: 21 capsule, Rfl: 0   albuterol (PROVENTIL) (2.5 MG/3ML) 0.083% nebulizer solution, INHALE 3 MLS (2.5 MG TOTAL) INTO THE LUNGS EVERY 6 (SIX) HOURS AS NEEDED., Disp: 150 mL, Rfl: 1   albuterol (VENTOLIN HFA) 108 (90 Base) MCG/ACT inhaler, INHALE 2 PUFFS BY MOUTH EVERY 6 HOURS AS NEEDED FOR WHEEZE OR SHORTNESS OF BREATH, Disp: 8.5 each, Rfl: 1   beclomethasone (QVAR REDIHALER) 40 MCG/ACT inhaler, INHALE 2 PUFFS INTO THE LUNGS TWICE A DAY, Disp: 10.6 g, Rfl: 0   benzonatate (TESSALON) 100 MG capsule, Take 1 capsule (100 mg total) by mouth 3 (three) times daily as needed for cough., Disp: 30 capsule, Rfl: 0   bisoprolol (ZEBETA) 5 MG tablet, TAKE 1 TABLET (5 MG TOTAL) BY MOUTH DAILY., Disp: 90 tablet, Rfl: 0   EPINEPHRINE 0.3 mg/0.3 mL IJ SOAJ injection, INJECT 0.3 MG INTO THE MUSCLE AS NEEDED FOR ANAPHYLAXIS., Disp: 2 each, Rfl: 1   fluticasone (FLONASE) 50 MCG/ACT nasal spray, SPRAY 2 SPRAYS INTO EACH NOSTRIL EVERY DAY, Disp: 48 mL, Rfl: 3   Insulin Pen Needle (UNIFINE PENTIPS) 31G X 5 MM MISC, use as directed with Saxenda, Disp: 100 each, Rfl: 2    LORazepam (ATIVAN) 0.5 MG tablet, Take 1 tablet (0.5 mg total) by mouth 2 (two) times daily as needed for anxiety., Disp: 30 tablet, Rfl: 0   omeprazole (PRILOSEC) 20 MG capsule, TAKE 1 CAPSULE (20 MG TOTAL) BY MOUTH AS NEEDED, Disp: 90 capsule, Rfl: 0   ondansetron (ZOFRAN-ODT) 4 MG disintegrating tablet, TAKE 1 TABLET BY MOUTH EVERY 8 HOURS AS NEEDED FOR NAUSEA AND VOMITING, Disp: 60 tablet, Rfl: 0   Semaglutide-Weight Management 0.25 MG/0.5ML SOAJ, Inject 0.25 mg into the skin once a week., Disp: 2 mL, Rfl: 1   Spacer/Aero-Holding Chambers DEVI, Use as directed, Disp: 1 Canister, Rfl: 0   vitamin B-12 (CYANOCOBALAMIN) 1000 MCG tablet, Take 1,000 mcg by mouth daily., Disp: , Rfl:   Observations/Objective: Patient is well-developed, well-nourished in no acute distress.  Resting comfortably at home.  Head is normocephalic, atraumatic.  No labored breathing.  Speech is clear and coherent with logical content.  Patient is alert and oriented at baseline.    Assessment and Plan: 1. Diarrhea, unspecified type - dicyclomine (BENTYL) 10 MG capsule; Take  1 capsule (10 mg total) by mouth 4 (four) times daily -  before meals and at bedtime.  Dispense: 21 capsule; Refill: 0  - Suspect possible functional diarrhea  - Advised to stop protein bar with fiber - Bentyl for cramping and diarrhea - Imodium for diarrhea - Push fluids, electrolyte beverages - Liquid diet, then increase to soft/bland (BRAT) diet over next day, then increase diet as tolerated - Seek in person evaluation if not improving or symptoms worsen   Follow Up Instructions: I discussed the assessment and treatment plan with the patient. The patient was provided an opportunity to ask questions and all were answered. The patient agreed with the plan and demonstrated an understanding of the instructions.  A copy of instructions were sent to the patient via MyChart unless otherwise noted below.    The patient was advised to call back or  seek an in-person evaluation if the symptoms worsen or if the condition fails to improve as anticipated.  Time:  I spent 10 minutes with the patient via telehealth technology discussing the above problems/concerns.    Margaretann Loveless, PA-C

## 2023-02-25 NOTE — Patient Instructions (Signed)
Yvonne Stephenson, thank you for joining Margaretann Loveless, PA-C for today's virtual visit.  While this provider is not your primary care provider (PCP), if your PCP is located in our provider database this encounter information will be shared with them immediately following your visit.   A [facility] MyChart account gives you access to today's visit and all your visits, tests, and labs performed at [facility] " click here if you don't have a [place] MyChart account or go to [email or web address]  Consent: (Patient) Yvonne Stephenson provided verbal consent for this virtual visit at the beginning of the encounter.  Current Medications:  Current Outpatient Medications:    dicyclomine (BENTYL) 10 MG capsule, Take 1 capsule (10 mg total) by mouth 4 (four) times daily -  before meals and at bedtime., Disp: 21 capsule, Rfl: 0   albuterol (PROVENTIL) (2.5 MG/3ML) 0.083% nebulizer solution, INHALE 3 MLS (2.5 MG TOTAL) INTO THE LUNGS EVERY 6 (SIX) HOURS AS NEEDED., Disp: 150 mL, Rfl: 1   albuterol (VENTOLIN HFA) 108 (90 Base) MCG/ACT inhaler, INHALE 2 PUFFS BY MOUTH EVERY 6 HOURS AS NEEDED FOR WHEEZE OR SHORTNESS OF BREATH, Disp: 8.5 each, Rfl: 1   beclomethasone (QVAR REDIHALER) 40 MCG/ACT inhaler, INHALE 2 PUFFS INTO THE LUNGS TWICE A DAY, Disp: 10.6 g, Rfl: 0   benzonatate (TESSALON) 100 MG capsule, Take 1 capsule (100 mg total) by mouth 3 (three) times daily as needed for cough., Disp: 30 capsule, Rfl: 0   bisoprolol (ZEBETA) 5 MG tablet, TAKE 1 TABLET (5 MG TOTAL) BY MOUTH DAILY., Disp: 90 tablet, Rfl: 0   EPINEPHRINE 0.3 mg/0.3 mL IJ SOAJ injection, INJECT 0.3 MG INTO THE MUSCLE AS NEEDED FOR ANAPHYLAXIS., Disp: 2 each, Rfl: 1   fluticasone (FLONASE) 50 MCG/ACT nasal spray, SPRAY 2 SPRAYS INTO EACH NOSTRIL EVERY DAY, Disp: 48 mL, Rfl: 3   Insulin Pen Needle (UNIFINE PENTIPS) 31G X 5 MM MISC, use as directed with Saxenda, Disp: 100 each, Rfl: 2   LORazepam (ATIVAN) 0.5 MG  tablet, Take 1 tablet (0.5 mg total) by mouth 2 (two) times daily as needed for anxiety., Disp: 30 tablet, Rfl: 0   omeprazole (PRILOSEC) 20 MG capsule, TAKE 1 CAPSULE (20 MG TOTAL) BY MOUTH AS NEEDED, Disp: 90 capsule, Rfl: 0   ondansetron (ZOFRAN-ODT) 4 MG disintegrating tablet, TAKE 1 TABLET BY MOUTH EVERY 8 HOURS AS NEEDED FOR NAUSEA AND VOMITING, Disp: 60 tablet, Rfl: 0   Semaglutide-Weight Management 0.25 MG/0.5ML SOAJ, Inject 0.25 mg into the skin once a week., Disp: 2 mL, Rfl: 1   Spacer/Aero-Holding Chambers DEVI, Use as directed, Disp: 1 Canister, Rfl: 0   vitamin B-12 (CYANOCOBALAMIN) 1000 MCG tablet, Take 1,000 mcg by mouth daily., Disp: , Rfl:    Medications ordered in this encounter:  Meds ordered this encounter  Medications   dicyclomine (BENTYL) 10 MG capsule    Sig: Take 1 capsule (10 mg total) by mouth 4 (four) times daily -  before meals and at bedtime.    Dispense:  21 capsule    Refill:  0    Order Specific Question:   Supervising Provider    Answer:   Merrilee Jansky [id number]     *If you need refills on other medications prior to your next appointment, please contact your pharmacy*  Follow-Up: Call back or seek an in-person evaluation if the symptoms worsen or if the condition fails to improve as anticipated.  [facility] [phone]  Other Instructions  Food Choices to Help Relieve Diarrhea, Adult Diarrhea can make you feel weak and cause you to become dehydrated. Dehydration is a condition in which there is not enough water or other fluids in the body. It is important to choose the right foods and drinks to: Relieve diarrhea. Replace lost fluids and nutrients. Prevent dehydration. What are tips for following this plan? Relieving diarrhea Avoid foods that make your diarrhea worse. These may include: Foods and drinks that are sweetened with high-fructose corn syrup, honey, or sweeteners such as xylitol, sorbitol, and mannitol. Check food  labels for these ingredients. Fried, greasy, or spicy foods. Raw fruits and vegetables. Eat foods that are rich in probiotics. These include foods such as yogurt and fermented milk products. Probiotics can help increase healthy bacteria in your stomach and intestines (gastrointestinal or GI tract). This may help digestion and stop diarrhea. If you have lactose intolerance, avoid dairy products. These may make your diarrhea worse. Take medicine to help stop diarrhea only as told by your health care provider. Replacing nutrients  Eat bland, easy-to-digest foods in small amounts as you are able, until your diarrhea starts to get better. These foods include bananas, applesauce, rice, toast, and crackers. Over time, add nutrient-rich foods as your body tolerates them or as told by your health care provider. These include: Well-cooked protein foods, such as eggs, lean meats like fish or chicken without skin, and tofu. Peeled, seeded, and soft-cooked fruits and vegetables. Low-fat dairy products. Whole grains. Take vitamin and mineral supplements as told by your health care provider. Preventing dehydration  Start by sipping water or a solution to prevent dehydration (oral rehydration solution, or ORS). This is a drink that helps replace fluids and minerals your body has lost. You can buy an ORS at pharmacies and retail stores. Try to drink at least 8-10 cups (2,000-2,500 mL) of fluid each day to help replace lost fluids. If your urine is pale yellow, you are getting enough fluids. You may drink other liquids in addition to water, such as fruit juice that you have added water to (diluted fruit juice) or low-calorie sports drinks, as tolerated or as told by your health care provider. Avoid drinks with caffeine, such as coffee, tea, or soft drinks. Avoid alcohol. This information is not intended to replace advice given to you by your health care provider. Make sure you discuss any questions you have with  your health care provider. Document Revised: 04/02/2022 Document Reviewed: 04/02/2022 Elsevier Patient Education  2023 Elsevier Inc.    If you have been instructed to have an in-person evaluation today at a local Urgent Care facility, please use the link below. It will take you to a list of all of our available [facility], including address, phone number and hours of operation. Please do not delay care.  [facility]  If you or a family member do not have a primary care provider, use the link below to schedule a visit and establish care. When you choose a [facility] primary care physician or advanced practice provider, you gain a long-term partner in health. Find a Primary Care Provider  Learn more about [place] [facility]'s in-office and virtual care options: [facility] - Get Care Now

## 2023-02-26 ENCOUNTER — Encounter: Payer: Self-pay | Admitting: Physician Assistant

## 2023-02-28 ENCOUNTER — Ambulatory Visit: Payer: BC Managed Care – PPO | Admitting: Nurse Practitioner

## 2023-02-28 VITALS — BP 138/88 | HR 95 | Temp 98.4°F | Ht 60.0 in | Wt 179.4 lb

## 2023-02-28 DIAGNOSIS — R197 Diarrhea, unspecified: Secondary | ICD-10-CM

## 2023-02-28 NOTE — Assessment & Plan Note (Signed)
Patient with no episodes yesterday and one today. Encouraged to increase Imodium- 2 tablets x 1 dose then every 6 hours as needed, max 4 tablets in 24 hours and continue Bentyl three times daily. Advised bland diet, can increase as tolerated and adequate fluid intake. Return precautions given to patient.

## 2023-02-28 NOTE — Progress Notes (Signed)
Bethanie Dicker, NP-C Phone: [phone]  Yvonne Stephenson is a 57 y.o. female who presents today for diarrhea.  Patient started having diarrhea last Wednesday. She took an Imodium that day did not have any further episodes over the weekend. She then started having diarrhea again on Monday. She reports approximately 8 episodes per day, she was awakening at night to use the restroom. She did a video visit 2 days ago where she was started on Bentyl 10 mg three times daily and advised to continue taking Imodium. She has been taking one Imodium each day. She does report improvement in her symptoms today. She did not have a bowel movement yesterday and she has only had one episode of loose stool today. She has not taken any medications today. Denies fevers. Denies body aches. Denies nausea/vomiting. Reports some abdominal cramping, tenderness and gas. She has been eating bland foods. She is drinking plenty of fluids.   Social History   Tobacco Use  Smoking Status Never  Smokeless Tobacco Never    Current Outpatient Medications on File Prior to Visit  Medication Sig Dispense Refill   albuterol (VENTOLIN HFA) 108 (90 Base) MCG/ACT inhaler INHALE 2 PUFFS BY MOUTH EVERY 6 HOURS AS NEEDED FOR WHEEZE OR SHORTNESS OF BREATH 8.5 each 1   beclomethasone (QVAR REDIHALER) 40 MCG/ACT inhaler INHALE 2 PUFFS INTO THE LUNGS TWICE A DAY 10.6 g 0   bisoprolol (ZEBETA) 5 MG tablet TAKE 1 TABLET (5 MG TOTAL) BY MOUTH DAILY. 90 tablet 0   dicyclomine (BENTYL) 10 MG capsule Take 1 capsule (10 mg total) by mouth 4 (four) times daily -  before meals and at bedtime. 21 capsule 0   EPINEPHRINE 0.3 mg/0.3 mL IJ SOAJ injection INJECT 0.3 MG INTO THE MUSCLE AS NEEDED FOR ANAPHYLAXIS. 2 each 1   fluticasone (FLONASE) 50 MCG/ACT nasal spray SPRAY 2 SPRAYS INTO EACH NOSTRIL EVERY DAY 48 mL 3   LORazepam (ATIVAN) 0.5 MG tablet Take 1 tablet (0.5 mg total) by mouth 2 (two) times daily as needed for anxiety. 30 tablet 0   omeprazole  (PRILOSEC) 20 MG capsule TAKE 1 CAPSULE (20 MG TOTAL) BY MOUTH AS NEEDED 90 capsule 0   ondansetron (ZOFRAN-ODT) 4 MG disintegrating tablet TAKE 1 TABLET BY MOUTH EVERY 8 HOURS AS NEEDED FOR NAUSEA AND VOMITING (Patient not taking: Reported on 02/28/2023) 60 tablet 0   Semaglutide-Weight Management 0.25 MG/0.5ML SOAJ Inject 0.25 mg into the skin once a week. (Patient not taking: Reported on 02/28/2023) 2 mL 1   Spacer/Aero-Holding Chambers DEVI Use as directed 1 Canister 0   vitamin B-12 (CYANOCOBALAMIN) 1000 MCG tablet Take 1,000 mcg by mouth daily.     No current facility-administered medications on file prior to visit.     ROS see history of present illness  Objective  Physical Exam Vitals:   02/28/23 1454  BP: 138/88  Pulse: 95  Temp: 98.4 F (36.9 C)  SpO2: 97%    BP Readings from Last 3 Encounters:  02/28/23 138/88  03/27/22 120/84  01/11/22 125/80   Wt Readings from Last 3 Encounters:  02/28/23 179 lb 6.4 oz (81.4 kg)  11/11/22 190 lb (86.2 kg)  03/27/22 193 lb (87.5 kg)    Physical Exam Constitutional:      General: She is not in acute distress.    Appearance: Normal appearance.  HENT:     Head: Normocephalic.  Cardiovascular:     Rate and Rhythm: Normal rate and regular rhythm.  Heart sounds: Normal heart sounds.  Pulmonary:     Effort: Pulmonary effort is normal.     Breath sounds: Normal breath sounds.  Abdominal:     General: Abdomen is flat. Bowel sounds are normal. There is no distension.     Palpations: Abdomen is soft.     Tenderness: There is generalized abdominal tenderness.  Skin:    General: Skin is warm and dry.  Neurological:     General: No focal deficit present.     Mental Status: She is alert.  Psychiatric:        Mood and Affect: Mood normal.        Behavior: Behavior normal.    Assessment/Plan: Please see individual problem list.  Diarrhea, unspecified type Assessment & Plan: Patient with no episodes yesterday and one today.  Encouraged to increase Imodium- 2 tablets x 1 dose then every 6 hours as needed, max 4 tablets in 24 hours and continue Bentyl three times daily. Advised bland diet, can increase as tolerated and adequate fluid intake. Return precautions given to patient.    Return if symptoms worsen or fail to improve.   Bethanie Dicker, NP-C [facility] [employer]

## 2023-03-22 ENCOUNTER — Encounter: Payer: Self-pay | Admitting: Internal Medicine

## 2023-03-24 ENCOUNTER — Telehealth: Payer: BC Managed Care – PPO | Admitting: Physician Assistant

## 2023-03-24 DIAGNOSIS — J020 Streptococcal pharyngitis: Secondary | ICD-10-CM

## 2023-03-24 MED ORDER — CLINDAMYCIN HCL 300 MG PO CAPS: 300.0000 mg | ORAL_CAPSULE | Freq: Three times a day (TID) | ORAL | 0 refills | Status: DC

## 2023-03-24 NOTE — Progress Notes (Signed)
Virtual Visit Consent   Yvonne Stephenson, you are scheduled for a virtual visit with a [facility] provider today. Just as with appointments in the office, your consent must be obtained to participate. Your consent will be active for this visit and any virtual visit you may have with one of our providers in the next 365 days. If you have a MyChart account, a copy of this consent can be sent to you electronically.  As this is a virtual visit, video technology does not allow for your provider to perform a traditional examination. This may limit your provider's ability to fully assess your condition. If your provider identifies any concerns that need to be evaluated in person or the need to arrange testing (such as labs, EKG, etc.), we will make arrangements to do so. Although advances in technology are sophisticated, we cannot ensure that it will always work on either your end or our end. If the connection with a video visit is poor, the visit may have to be switched to a telephone visit. With either a video or telephone visit, we are not always able to ensure that we have a secure connection.  By engaging in this virtual visit, you consent to the provision of healthcare and authorize for your insurance to be billed (if applicable) for the services provided during this visit. Depending on your insurance coverage, you may receive a charge related to this service.  I need to obtain your verbal consent now. Are you willing to proceed with your visit today? Yvonne Stephenson has provided verbal consent on 03/24/2023 for a virtual visit (video or telephone). Margaretann Loveless, PA-C  Date: 03/24/2023 11:27 AM  Virtual Visit via Video Note   I, Margaretann Loveless, connected with  Yvonne Stephenson  ([id number], 03/26/1966) on 03/24/23 at 11:30 AM EDT by a video-enabled telemedicine application and verified that I am speaking with the correct person using two identifiers.  Location: Patient: Virtual Visit  Location Patient: Home Provider: Virtual Visit Location Provider: Home Office   I discussed the limitations of evaluation and management by telemedicine and the availability of in person appointments. The patient expressed understanding and agreed to proceed.    History of Present Illness: Yvonne Stephenson is a 57 y.o. who identifies as a female who was assigned female at birth, and is being seen today for sore throat.  HPI: Sore Throat  This is a new problem. The current episode started in the past 7 days (Started Friday and Saturday, worsened last night). The problem has been gradually worsening. The pain is worse on the left side. Maximum temperature: subjective fevers. The fever has been present for Less than 1 day. The pain is moderate. Associated symptoms include congestion, coughing (tickle cough), ear pain (left), headaches, a plugged ear sensation, swollen glands and trouble swallowing (painful). Pertinent negatives include no diarrhea, ear discharge, neck pain, shortness of breath or vomiting. Associated symptoms comments: Sinus congestion. She has had exposure to strep. Treatments tried: advil, pushed fluids. The treatment provided no relief.     Problems:  Patient Active Problem List   Diagnosis Date Noted   Diarrhea 02/28/2023   Morbid obesity (HCC) 11/11/2022   Mild intermittent asthma 08/26/2016   Asthma with acute exacerbation 08/12/2016   Preventative health care 07/10/2012   Fatty infiltration of liver 01/14/2012   Hypertriglyceridemia 09/10/2007   Mild anxiety 09/10/2007   Essential hypertension, benign 09/10/2007   GERD 09/10/2007    Allergies:  Allergies  Allergen Reactions   Penicillins Shortness Of Breath    Has patient had a PCN reaction causing immediate rash, facial/tongue/throat swelling, SOB or lightheadedness with hypotension: Yes Has patient had a PCN reaction causing severe rash involving mucus membranes or skin necrosis: No Has patient had a PCN  reaction that required hospitalization: No Has patient had a PCN reaction occurring within the last 10 years: No If all of the above answers are "NO", then may proceed with Cephalosporin use.   REACTION: difficulty breathing   Shellfish Allergy Shortness Of Breath   Amoxicillin-Pot Clavulanate Swelling    Tongue swelling   Atorvastatin     REACTION: hip pain   Bee Venom Swelling    Shakes, chills , vomiting   Cefprozil     REACTION: rash   Demerol Nausea And Vomiting   Dilaudid [Hydromorphone Hcl] Nausea And Vomiting   Doxycycline Other (See Comments)    Severe Abdominal Pain   Meloxicam [Meloxicam] Swelling    Face swelling and lip tingling   Zithromax [Azithromycin]     REACTION: tongue felt funny   Medications:  Current Outpatient Medications:    albuterol (VENTOLIN HFA) 108 (90 Base) MCG/ACT inhaler, INHALE 2 PUFFS BY MOUTH EVERY 6 HOURS AS NEEDED FOR WHEEZE OR SHORTNESS OF BREATH, Disp: 8.5 each, Rfl: 1   beclomethasone (QVAR REDIHALER) 40 MCG/ACT inhaler, INHALE 2 PUFFS INTO THE LUNGS TWICE A DAY, Disp: 10.6 g, Rfl: 0   bisoprolol (ZEBETA) 5 MG tablet, TAKE 1 TABLET (5 MG TOTAL) BY MOUTH DAILY., Disp: 90 tablet, Rfl: 0   clindamycin (CLEOCIN) 300 MG capsule, Take 1 capsule (300 mg total) by mouth 3 (three) times daily., Disp: 30 capsule, Rfl: 0   dicyclomine (BENTYL) 10 MG capsule, Take 1 capsule (10 mg total) by mouth 4 (four) times daily -  before meals and at bedtime., Disp: 21 capsule, Rfl: 0   EPINEPHRINE 0.3 mg/0.3 mL IJ SOAJ injection, INJECT 0.3 MG INTO THE MUSCLE AS NEEDED FOR ANAPHYLAXIS., Disp: 2 each, Rfl: 1   fluticasone (FLONASE) 50 MCG/ACT nasal spray, SPRAY 2 SPRAYS INTO EACH NOSTRIL EVERY DAY, Disp: 48 mL, Rfl: 3   LORazepam (ATIVAN) 0.5 MG tablet, Take 1 tablet (0.5 mg total) by mouth 2 (two) times daily as needed for anxiety., Disp: 30 tablet, Rfl: 0   omeprazole (PRILOSEC) 20 MG capsule, TAKE 1 CAPSULE (20 MG TOTAL) BY MOUTH AS NEEDED, Disp: 90 capsule,  Rfl: 0   ondansetron (ZOFRAN-ODT) 4 MG disintegrating tablet, TAKE 1 TABLET BY MOUTH EVERY 8 HOURS AS NEEDED FOR NAUSEA AND VOMITING (Patient not taking: Reported on 02/28/2023), Disp: 60 tablet, Rfl: 0   Semaglutide-Weight Management 0.25 MG/0.5ML SOAJ, Inject 0.25 mg into the skin once a week. (Patient not taking: Reported on 02/28/2023), Disp: 2 mL, Rfl: 1   Spacer/Aero-Holding Chambers DEVI, Use as directed, Disp: 1 Canister, Rfl: 0   vitamin B-12 (CYANOCOBALAMIN) 1000 MCG tablet, Take 1,000 mcg by mouth daily., Disp: , Rfl:   Observations/Objective: Patient is well-developed, well-nourished in no acute distress.  Resting comfortably at home.  Head is normocephalic, atraumatic.  No labored breathing.  Speech is clear and coherent with logical content.  Patient is alert and oriented at baseline.    Assessment and Plan: 1. Strep pharyngitis - clindamycin (CLEOCIN) 300 MG capsule; Take 1 capsule (300 mg total) by mouth 3 (three) times daily.  Dispense: 30 capsule; Refill: 0  - Suspect strep throat - Clindamycin prescribed - Tylenol and Ibuprofen alternating every 4 hours -  Salt water gargles - Chloraseptic spray - Liquid and soft food diet - Push fluids - New toothbrush in 3 days - Seek in person evaluation if not improving or if symptoms worsen   Follow Up Instructions: I discussed the assessment and treatment plan with the patient. The patient was provided an opportunity to ask questions and all were answered. The patient agreed with the plan and demonstrated an understanding of the instructions.  A copy of instructions were sent to the patient via MyChart unless otherwise noted below.    The patient was advised to call back or seek an in-person evaluation if the symptoms worsen or if the condition fails to improve as anticipated.  Time:  I spent 10 minutes with the patient via telehealth technology discussing the above problems/concerns.    Margaretann Loveless, PA-C

## 2023-03-24 NOTE — Patient Instructions (Signed)
Yvonne Stephenson, thank you for joining Margaretann Loveless, PA-C for today's virtual visit.  While this provider is not your primary care provider (PCP), if your PCP is located in our provider database this encounter information will be shared with them immediately following your visit.   A [facility] MyChart account gives you access to today's visit and all your visits, tests, and labs performed at [facility] " click here if you don't have a [facility] MyChart account or go to [email or web address]  Consent: (Patient) Yvonne Stephenson provided verbal consent for this virtual visit at the beginning of the encounter.  Current Medications:  Current Outpatient Medications:    albuterol (VENTOLIN HFA) 108 (90 Base) MCG/ACT inhaler, INHALE 2 PUFFS BY MOUTH EVERY 6 HOURS AS NEEDED FOR WHEEZE OR SHORTNESS OF BREATH, Disp: 8.5 each, Rfl: 1   beclomethasone (QVAR REDIHALER) 40 MCG/ACT inhaler, INHALE 2 PUFFS INTO THE LUNGS TWICE A DAY, Disp: 10.6 g, Rfl: 0   bisoprolol (ZEBETA) 5 MG tablet, TAKE 1 TABLET (5 MG TOTAL) BY MOUTH DAILY., Disp: 90 tablet, Rfl: 0   clindamycin (CLEOCIN) 300 MG capsule, Take 1 capsule (300 mg total) by mouth 3 (three) times daily., Disp: 30 capsule, Rfl: 0   dicyclomine (BENTYL) 10 MG capsule, Take 1 capsule (10 mg total) by mouth 4 (four) times daily -  before meals and at bedtime., Disp: 21 capsule, Rfl: 0   EPINEPHRINE 0.3 mg/0.3 mL IJ SOAJ injection, INJECT 0.3 MG INTO THE MUSCLE AS NEEDED FOR ANAPHYLAXIS., Disp: 2 each, Rfl: 1   fluticasone (FLONASE) 50 MCG/ACT nasal spray, SPRAY 2 SPRAYS INTO EACH NOSTRIL EVERY DAY, Disp: 48 mL, Rfl: 3   LORazepam (ATIVAN) 0.5 MG tablet, Take 1 tablet (0.5 mg total) by mouth 2 (two) times daily as needed for anxiety., Disp: 30 tablet, Rfl: 0   omeprazole (PRILOSEC) 20 MG capsule, TAKE 1 CAPSULE (20 MG TOTAL) BY MOUTH AS NEEDED, Disp: 90 capsule, Rfl: 0   ondansetron (ZOFRAN-ODT) 4 MG disintegrating tablet, TAKE 1  TABLET BY MOUTH EVERY 8 HOURS AS NEEDED FOR NAUSEA AND VOMITING (Patient not taking: Reported on 02/28/2023), Disp: 60 tablet, Rfl: 0   Semaglutide-Weight Management 0.25 MG/0.5ML SOAJ, Inject 0.25 mg into the skin once a week. (Patient not taking: Reported on 02/28/2023), Disp: 2 mL, Rfl: 1   Spacer/Aero-Holding Chambers DEVI, Use as directed, Disp: 1 Canister, Rfl: 0   vitamin B-12 (CYANOCOBALAMIN) 1000 MCG tablet, Take 1,000 mcg by mouth daily., Disp: , Rfl:    Medications ordered in this encounter:  Meds ordered this encounter  Medications   clindamycin (CLEOCIN) 300 MG capsule    Sig: Take 1 capsule (300 mg total) by mouth 3 (three) times daily.    Dispense:  30 capsule    Refill:  0    Order Specific Question:   Supervising Provider    Answer:   Merrilee Jansky [id number]     *If you need refills on other medications prior to your next appointment, please contact your pharmacy*  Follow-Up: Call back or seek an in-person evaluation if the symptoms worsen or if the condition fails to improve as anticipated.   Virtual Care [phone]  Other Instructions Strep Throat, Adult Strep throat is an infection in the throat that is caused by bacteria. It is common during the cold months of the year. It mostly affects children who are 40-37 years old. However, people of all ages can get it at any time of the  year. This infection spreads from person to person (is contagious) through coughing, sneezing, or having close contact. Your health care provider may use other names to describe the infection. When strep throat affects the tonsils, it is called tonsillitis. When it affects the back of the throat, it is called pharyngitis. What are the causes? This condition is caused by the Streptococcus pyogenes bacteria. What increases the risk? You are more likely to develop this condition if: You care for school-age children, or are around school-age children. Children are more likely to  get strep throat and may spread it to others. You spend time in crowded places where the infection can spread easily. You have close contact with someone who has strep throat. What are the signs or symptoms? Symptoms of this condition include: Fever or chills. Redness, swelling, or pain in the tonsils or throat. Pain or difficulty when swallowing. White or yellow spots on the tonsils or throat. Tender glands in the neck and under the jaw. Bad smelling breath. Red rash all over the body. This is rare. How is this diagnosed? This condition is diagnosed by tests that check for the presence and the amount of bacteria that cause strep throat. They are: Rapid strep test. Your throat is swabbed and checked for the presence of bacteria. Results are usually ready in minutes. Throat culture test. Your throat is swabbed. The sample is placed in a cup that allows infections to grow. Results are usually ready in 1 or 2 days. How is this treated? This condition may be treated with: Medicines that kill germs (antibiotics). Medicines that relieve pain or fever. These include: Ibuprofen or acetaminophen. Aspirin, only for people who are over the age of 42. Throat lozenges. Throat sprays. Follow these instructions at home: Medicines  Take over-the-counter and prescription medicines only as told by your health care provider. Take your antibiotic medicine as told by your health care provider. Do not stop taking the antibiotic even if you start to feel better. Eating and drinking  If you have trouble swallowing, try eating soft foods until your sore throat feels better. Drink enough fluid to keep your urine pale yellow. To help relieve pain, you may have: Warm fluids, such as soup and tea. Cold fluids, such as frozen desserts or popsicles. General instructions Gargle with a salt-water mixture 3-4 times a day or as needed. To make a salt-water mixture, completely dissolve -1 tsp (3-6 g) of salt in 1  cup (237 mL) of warm water. Get plenty of rest. Stay home from work or school until you have been taking antibiotics for 24 hours. Do not use any products that contain nicotine or tobacco. These products include cigarettes, chewing tobacco, and vaping devices, such as e-cigarettes. If you need help quitting, ask your health care provider. It is up to you to get your test results. Ask your health care provider, or the department that is doing the test, when your results will be ready. Keep all follow-up visits. This is important. How is this prevented?  Do not share food, drinking cups, or personal items that could cause the infection to spread to other people. Wash your hands often with soap and water for at least 20 seconds. If soap and water are not available, use hand sanitizer. Make sure that all people in your house wash their hands well. Have family members tested if they have a sore throat or fever. They may need an antibiotic if they have strep throat. Contact a health care provider  if: You have swelling in your neck that keeps getting bigger. You develop a rash, cough, or earache. You cough up a thick mucus that is green, yellow-brown, or bloody. You have pain or discomfort that does not get better with medicine. Your symptoms seem to be getting worse. You have a fever. Get help right away if: You have new symptoms, such as vomiting, severe headache, stiff or painful neck, chest pain, or shortness of breath. You have severe throat pain, drooling, or changes in your voice. You have swelling of the neck, or the skin on the neck becomes red and tender. You have signs of dehydration, such as tiredness (fatigue), dry mouth, and decreased urination. You become increasingly sleepy, or you cannot wake up completely. Your joints become red or painful. These symptoms may represent a serious problem that is an emergency. Do not wait to see if the symptoms will go away. Get medical help right  away. Call your local emergency services (911 in the [place] Do not drive yourself to the hospital. Summary Strep throat is an infection in the throat that is caused by the Streptococcus pyogenes bacteria. This infection is spread from person to person (is contagious) through coughing, sneezing, or having close contact. Take your medicines, including antibiotics, as told by your health care provider. Do not stop taking the antibiotic even if you start to feel better. To prevent the spread of germs, wash your hands well with soap and water. Have others do the same. Do not share food, drinking cups, or personal items. Get help right away if you have new symptoms, such as vomiting, severe headache, stiff or painful neck, chest pain, or shortness of breath. This information is not intended to replace advice given to you by your health care provider. Make sure you discuss any questions you have with your health care provider. Document Revised: 02/06/2021 Document Reviewed: 02/06/2021 Elsevier Patient Education  2024 Elsevier Inc.    If you have been instructed to have an in-person evaluation today at a local Urgent Care facility, please use the link below. It will take you to a list of all of our available [facility], including address, phone number and hours of operation. Please do not delay care.  [facility]  If you or a family member do not have a primary care provider, use the link below to schedule a visit and establish care. When you choose a [facility] primary care physician or advanced practice provider, you gain a long-term partner in health. Find a Primary Care Provider  Learn more about [facility]'s in-office and virtual care options: [facility] - Get Care Now

## 2023-03-25 MED ORDER — SEMAGLUTIDE-WEIGHT MANAGEMENT 0.5 MG/0.5ML ~~LOC~~ SOAJ: 0.5000 mg | SUBCUTANEOUS | 5 refills | Status: DC

## 2023-04-11 ENCOUNTER — Other Ambulatory Visit: Payer: Self-pay | Admitting: Internal Medicine

## 2023-04-11 ENCOUNTER — Ambulatory Visit (INDEPENDENT_AMBULATORY_CARE_PROVIDER_SITE_OTHER): Payer: BC Managed Care – PPO | Admitting: Internal Medicine

## 2023-04-11 ENCOUNTER — Encounter: Payer: Self-pay | Admitting: Internal Medicine

## 2023-04-11 VITALS — BP 112/74 | HR 84 | Temp 97.5°F | Ht 60.25 in | Wt 179.0 lb

## 2023-04-11 DIAGNOSIS — J4520 Mild intermittent asthma, uncomplicated: Secondary | ICD-10-CM

## 2023-04-11 DIAGNOSIS — Z0000 Encounter for general adult medical examination without abnormal findings: Secondary | ICD-10-CM

## 2023-04-11 DIAGNOSIS — I10 Essential (primary) hypertension: Secondary | ICD-10-CM

## 2023-04-11 DIAGNOSIS — Z23 Encounter for immunization: Secondary | ICD-10-CM | POA: Diagnosis not present

## 2023-04-11 DIAGNOSIS — Z1231 Encounter for screening mammogram for malignant neoplasm of breast: Secondary | ICD-10-CM

## 2023-04-11 LAB — COMPREHENSIVE METABOLIC PANEL
ALT: 53 U/L — ABNORMAL HIGH (ref 0–35)
AST: 49 U/L — ABNORMAL HIGH (ref 0–37)
Albumin: 4.7 g/dL (ref 3.5–5.2)
Alkaline Phosphatase: 96 U/L (ref 39–117)
BUN: 16 mg/dL (ref 6–23)
CO2: 24 mEq/L (ref 19–32)
Calcium: 10 mg/dL (ref 8.4–10.5)
Chloride: 101 mEq/L (ref 96–112)
Creatinine, Ser: 0.66 mg/dL (ref 0.40–1.20)
GFR: 97.36 mL/min (ref >60.00)
Glucose, Bld: 92 mg/dL (ref 70–99)
Potassium: 4.1 mEq/L (ref 3.5–5.1)
Sodium: 137 mEq/L (ref 135–145)
Total Bilirubin: 0.5 mg/dL (ref 0.2–1.2)
Total Protein: 8.2 g/dL (ref 6.0–8.3)

## 2023-04-11 LAB — CBC
HCT: 47.4 % — ABNORMAL HIGH (ref 36.0–46.0)
Hemoglobin: 15.5 g/dL — ABNORMAL HIGH (ref 12.0–15.0)
MCHC: 32.7 g/dL (ref 30.0–36.0)
MCV: 83.1 fl (ref 78.0–100.0)
Platelets: 141 10*3/uL — ABNORMAL LOW (ref 150.0–400.0)
RBC: 5.7 Mil/uL — ABNORMAL HIGH (ref 3.87–5.11)
RDW: 14.3 % (ref 11.5–15.5)
WBC: 6.2 10*3/uL (ref 4.0–10.5)

## 2023-04-11 LAB — LIPID PANEL
Cholesterol: 224 mg/dL — ABNORMAL HIGH (ref 0–200)
HDL: 49.4 mg/dL (ref >39.00)
LDL Cholesterol: 144 mg/dL — ABNORMAL HIGH (ref 0–99)
NonHDL: 174.45
Total CHOL/HDL Ratio: 5
Triglycerides: 152 mg/dL — ABNORMAL HIGH (ref 0.0–149.0)
VLDL: 30.4 mg/dL (ref 0.0–40.0)

## 2023-04-11 MED ORDER — SEMAGLUTIDE-WEIGHT MANAGEMENT 1 MG/0.5ML ~~LOC~~ SOAJ: 1.0000 mg | SUBCUTANEOUS | 11 refills | Status: DC

## 2023-04-11 NOTE — Assessment & Plan Note (Signed)
Healthy Is getting back to exercise Mammogram every 2 years--due by the end of the year or so No pap due to hyster Colon due 2032 Prefers no COVID vaccine Flu vaccine in the fall Shingrix today

## 2023-04-11 NOTE — Assessment & Plan Note (Signed)
Controlled on the Qvar

## 2023-04-11 NOTE — Addendum Note (Signed)
Addended by: Eual Fines on: 04/11/2023 11:04 AM   Modules accepted: Orders

## 2023-04-11 NOTE — Progress Notes (Signed)
Subjective:    Patient ID: Yvonne Stephenson, female    DOB: 06-12-1966, 57 y.o.   MRN: [id number]  HPI Here for physical  Tolerating the wegovy Hasn't lost that much Some mild nausea 2-3 days later  Doing okay otherwise Did have lumbar fusion in September---successful (still gets sore)  Current Outpatient Medications on File Prior to Visit  Medication Sig Dispense Refill   albuterol (VENTOLIN HFA) 108 (90 Base) MCG/ACT inhaler INHALE 2 PUFFS BY MOUTH EVERY 6 HOURS AS NEEDED FOR WHEEZE OR SHORTNESS OF BREATH 8.5 each 1   beclomethasone (QVAR REDIHALER) 40 MCG/ACT inhaler INHALE 2 PUFFS INTO THE LUNGS TWICE A DAY 10.6 g 0   bisoprolol (ZEBETA) 5 MG tablet TAKE 1 TABLET (5 MG TOTAL) BY MOUTH DAILY. 90 tablet 0   dicyclomine (BENTYL) 10 MG capsule Take 1 capsule (10 mg total) by mouth 4 (four) times daily -  before meals and at bedtime. 21 capsule 0   EPINEPHRINE 0.3 mg/0.3 mL IJ SOAJ injection INJECT 0.3 MG INTO THE MUSCLE AS NEEDED FOR ANAPHYLAXIS. 2 each 1   fluticasone (FLONASE) 50 MCG/ACT nasal spray SPRAY 2 SPRAYS INTO EACH NOSTRIL EVERY DAY 48 mL 3   LORazepam (ATIVAN) 0.5 MG tablet Take 1 tablet (0.5 mg total) by mouth 2 (two) times daily as needed for anxiety. 30 tablet 0   omeprazole (PRILOSEC) 20 MG capsule TAKE 1 CAPSULE (20 MG TOTAL) BY MOUTH AS NEEDED 90 capsule 0   ondansetron (ZOFRAN-ODT) 4 MG disintegrating tablet TAKE 1 TABLET BY MOUTH EVERY 8 HOURS AS NEEDED FOR NAUSEA AND VOMITING 60 tablet 0   Semaglutide-Weight Management 0.5 MG/0.5ML SOAJ Inject 0.5 mg into the skin once a week. 2 mL 5   Spacer/Aero-Holding Chambers DEVI Use as directed 1 Canister 0   vitamin B-12 (CYANOCOBALAMIN) 1000 MCG tablet Take 1,000 mcg by mouth daily.     No current facility-administered medications on file prior to visit.    Allergies  Allergen Reactions   Penicillins Shortness Of Breath    Has patient had a PCN reaction causing immediate rash, facial/tongue/throat swelling, SOB or  lightheadedness with hypotension: Yes Has patient had a PCN reaction causing severe rash involving mucus membranes or skin necrosis: No Has patient had a PCN reaction that required hospitalization: No Has patient had a PCN reaction occurring within the last 10 years: No If all of the above answers are "NO", then may proceed with Cephalosporin use.   REACTION: difficulty breathing   Shellfish Allergy Shortness Of Breath   Amoxicillin-Pot Clavulanate Swelling    Tongue swelling   Atorvastatin     REACTION: hip pain   Bee Venom Swelling    Shakes, chills , vomiting   Cefprozil     REACTION: rash   Demerol Nausea And Vomiting   Dilaudid [Hydromorphone Hcl] Nausea And Vomiting   Doxycycline Other (See Comments)    Severe Abdominal Pain   Meloxicam [Meloxicam] Swelling    Face swelling and lip tingling   Zithromax [Azithromycin]     REACTION: tongue felt funny    Past Medical History:  Diagnosis Date   Allergy    seasonal   Anemia    Anxiety    Asthma    only when gets a cold-expired inhaler   GERD (gastroesophageal reflux disease)    Hyperlipidemia    past hx   Hypertension    PONV (postoperative nausea and vomiting)     Past Surgical History:  Procedure Laterality Date  ABDOMINAL HYSTERECTOMY  07/2006   BREAST BIOPSY Left    BREAST EXCISIONAL BIOPSY Right    BREAST SURGERY  2004   LUMPECTOMY   CARPAL TUNNEL RELEASE  01/2009   LEFT (Dr. Brynda Greathouse)   CESAREAN SECTION     3   COLONOSCOPY  2010   LAPAROSCOPY  04/09/2012   Procedure: LAPAROSCOPY OPERATIVE;  Surgeon: Reva Bores, MD;  Location: [facility];  Service: Gynecology;  Laterality: N/A;   LUMBAR FUSION  06/2022   L4-L5   NASAL STENOSIS REPAIR  2003   POLYPECTOMY     TA 2010   SALPINGOOPHORECTOMY  04/09/2012   Procedure: SALPINGO OOPHERECTOMY;  Surgeon: Reva Bores, MD;  Location: [facility];  Service: Gynecology;  Laterality: Right;   SHOULDER SURGERY Right 2016   spur on bone, states shoulder dislocated,  [place]   TUBAL LIGATION      Family History  Problem Relation Age of Onset   Heart attack Mother    Heart disease Mother    Hypertension Mother    Stroke Mother    Lung cancer Mother        smoked   Allergies Mother    Heart attack Father    Heart disease Father    Hypertension Father    Diabetes Maternal Uncle    Cancer Maternal Grandmother    Heart failure Maternal Grandfather    Heart failure Paternal Grandmother    Colon cancer Neg Hx    Colon polyps Neg Hx    Esophageal cancer Neg Hx    Rectal cancer Neg Hx    Stomach cancer Neg Hx     Social History   Socioeconomic History   Marital status: Married    Spouse name: Not on file   Number of children: 3   Years of education: Not on file   Highest education level: 12th grade  Occupational History   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[place]   Occupation:    Tobacco Use   Smoking status: Never    Passive exposure: Past   Smokeless tobacco: Never  Vaping Use   Vaping Use: Never used  Substance and Sexual Activity   Alcohol use: Yes    Comment: occasionaly, every few months   Drug use: No   Sexual activity: Not on file  Other Topics Concern   Not on file  Social History Narrative   Does walk a little for exercise   Social Determinants of Health   Financial Resource Strain: Low Risk  (02/28/2023)   Overall Financial Resource Strain (CARDIA)    Difficulty of Paying Living Expenses: Not hard at all  Food Insecurity: No Food Insecurity (02/28/2023)   Hunger Vital Sign    Worried About Running Out of Food in the Last Year: Never true    Ran Out of Food in the Last Year: Never true  Transportation Needs: No Transportation Needs (02/28/2023)   PRAPARE - Administrator, Civil Service (Medical): No    Lack of Transportation (Non-Medical): No  Physical Activity: Unknown (02/28/2023)   Exercise Vital Sign    Days of Exercise per Week: Patient declined    Minutes of  Exercise per Session: Not on file  Stress: No Stress Concern Present (02/28/2023)   [employer] of Occupational Health - Occupational Stress Questionnaire    Feeling of Stress : Not at all  Social Connections: Unknown (02/28/2023)   Social Connection and Isolation Panel [NHANES]  Frequency of Communication with Friends and Family: More than three times a week    Frequency of Social Gatherings with Friends and Family: More than three times a week    Attends Religious Services: Patient declined    Database administrator or Organizations: No    Attends Engineer, structural: Not on file    Marital Status: Married  Catering manager Violence: Not on file    Review of Systems  Constitutional:  Negative for fatigue and unexpected weight change.       Wears seat belt Has started some walking and bicycling  HENT:  Negative for hearing loss, tinnitus and trouble swallowing.        Has needed some dental work  Eyes:  Negative for visual disturbance.       No diplopia or unilateral vision loss  Respiratory:  Negative for cough, chest tightness, shortness of breath and wheezing.        Uses Q-var daily Not needing the rescue inhaler   Cardiovascular:  Positive for leg swelling. Negative for chest pain and palpitations.  Gastrointestinal:  Negative for blood in stool.       Episodic constipation --on wegovy Some heartburn--with certain foods--omeprazole helps  Endocrine: Negative for polydipsia and polyuria.  Genitourinary:  Negative for dyspareunia, dysuria and hematuria.  Musculoskeletal:  Positive for back pain. Negative for arthralgias and joint swelling.  Skin:  Negative for rash.       No suspicious lesions  Allergic/Immunologic: Negative for immunocompromised state.       Allergies controlled on flonase  Neurological:  Negative for dizziness, syncope, light-headedness and headaches.  Hematological:  Does not bruise/bleed easily.       Neck nodes with recent strep   Psychiatric/Behavioral:  Negative for dysphoric mood.        Variable sleep Hasn't needed the lorazepam for some time       Objective:   Physical Exam Constitutional:      Appearance: Normal appearance.  HENT:     Mouth/Throat:     Pharynx: No oropharyngeal exudate or posterior oropharyngeal erythema.  Eyes:     Conjunctiva/sclera: Conjunctivae normal.     Pupils: Pupils are equal, round, and reactive to light.  Cardiovascular:     Rate and Rhythm: Normal rate and regular rhythm.     Pulses: Normal pulses.     Heart sounds: No murmur heard.    No gallop.  Pulmonary:     Effort: Pulmonary effort is normal.     Breath sounds: Normal breath sounds. No wheezing or rales.  Abdominal:     Palpations: Abdomen is soft.     Tenderness: There is no abdominal tenderness.  Musculoskeletal:     Cervical back: Neck supple.     Right lower leg: No edema.     Left lower leg: No edema.  Lymphadenopathy:     Cervical: No cervical adenopathy.  Skin:    Findings: No rash.  Neurological:     General: No focal deficit present.     Mental Status: She is alert and oriented to person, place, and time.  Psychiatric:        Mood and Affect: Mood normal.        Behavior: Behavior normal.            Assessment & Plan:

## 2023-04-11 NOTE — Assessment & Plan Note (Signed)
BP Readings from Last 3 Encounters:  04/11/23 112/74  02/28/23 138/88  03/27/22 120/84   BP fine on bisoprolol 5mg 

## 2023-04-11 NOTE — Assessment & Plan Note (Signed)
Mild symptoms only with wegovy 0.5mg  Will proceed to the 1mg  dose

## 2023-04-29 ENCOUNTER — Encounter: Payer: Self-pay | Admitting: Internal Medicine

## 2023-04-30 ENCOUNTER — Ambulatory Visit
Admission: RE | Admit: 2023-04-30 | Discharge: 2023-04-30 | Disposition: A | Discharge status: Home / Self Care | Payer: BC Managed Care – PPO | Source: Ambulatory Visit | Attending: Internal Medicine | Admitting: Internal Medicine

## 2023-04-30 DIAGNOSIS — Z1231 Encounter for screening mammogram for malignant neoplasm of breast: Secondary | ICD-10-CM | POA: Diagnosis not present

## 2023-05-04 ENCOUNTER — Other Ambulatory Visit: Payer: Self-pay | Admitting: Internal Medicine

## 2023-05-19 MED ORDER — SEMAGLUTIDE-WEIGHT MANAGEMENT 1.7 MG/0.75ML ~~LOC~~ SOAJ: 1.7000 mg | SUBCUTANEOUS | 3 refills | Status: DC

## 2023-05-22 ENCOUNTER — Other Ambulatory Visit: Payer: Self-pay | Admitting: Internal Medicine

## 2023-06-05 ENCOUNTER — Telehealth (INDEPENDENT_AMBULATORY_CARE_PROVIDER_SITE_OTHER): Payer: BC Managed Care – PPO | Admitting: Internal Medicine

## 2023-06-05 ENCOUNTER — Encounter: Payer: Self-pay | Admitting: Internal Medicine

## 2023-06-05 VITALS — Wt 172.0 lb

## 2023-06-05 DIAGNOSIS — J0100 Acute maxillary sinusitis, unspecified: Secondary | ICD-10-CM

## 2023-06-05 MED ORDER — CLINDAMYCIN HCL 300 MG PO CAPS: 300.0000 mg | ORAL_CAPSULE | Freq: Three times a day (TID) | ORAL | 0 refills | Status: DC

## 2023-06-05 NOTE — Progress Notes (Signed)
Subjective:    Patient ID: Yvonne Stephenson, female    DOB: 11/07/1965, 57 y.o.   MRN: [id number]  HPI Video virtual visit due to a respiratory infection Identification done Reviewed limitations and billing and she gave consent Participants--patient in her home and I am in my office  Started ~5-6 days ago Throat was raw and then sinuses got congested Maxillary pressure Mold in her building and stripping floors---usually gets sinus symptoms from that No fever Only occasional cough No SOB Some left ear pain Pressure worse in face and discolored mucus  Tried dayquil and nyquil Also tylenol  Current Outpatient Medications on File Prior to Visit  Medication Sig Dispense Refill   albuterol (VENTOLIN HFA) 108 (90 Base) MCG/ACT inhaler INHALE 2 PUFFS BY MOUTH EVERY 6 HOURS AS NEEDED FOR WHEEZE OR SHORTNESS OF BREATH 8.5 each 1   beclomethasone (QVAR REDIHALER) 40 MCG/ACT inhaler INHALE 2 PUFFS INTO THE LUNGS TWICE A DAY 10.6 g 0   bisoprolol (ZEBETA) 5 MG tablet TAKE 1 TABLET (5 MG TOTAL) BY MOUTH DAILY. 90 tablet 1   dicyclomine (BENTYL) 10 MG capsule Take 1 capsule (10 mg total) by mouth 4 (four) times daily -  before meals and at bedtime. 21 capsule 0   ELDERBERRY PO Take by mouth.     EPINEPHRINE 0.3 mg/0.3 mL IJ SOAJ injection INJECT 0.3 MG INTO THE MUSCLE AS NEEDED FOR ANAPHYLAXIS. 2 each 1   fluticasone (FLONASE) 50 MCG/ACT nasal spray SPRAY 2 SPRAYS INTO EACH NOSTRIL EVERY DAY 48 mL 3   FOLIC ACID PO Take by mouth.     LORazepam (ATIVAN) 0.5 MG tablet Take 1 tablet (0.5 mg total) by mouth 2 (two) times daily as needed for anxiety. 30 tablet 0   Multiple Vitamins-Minerals (ZINC PO) Take by mouth.     omeprazole (PRILOSEC) 20 MG capsule TAKE 1 CAPSULE (20 MG TOTAL) BY MOUTH AS NEEDED 90 capsule 3   ondansetron (ZOFRAN-ODT) 4 MG disintegrating tablet TAKE 1 TABLET BY MOUTH EVERY 8 HOURS AS NEEDED FOR NAUSEA AND VOMITING 60 tablet 0   Semaglutide-Weight Management 1.7 MG/0.75ML SOAJ  Inject 1.7 mg into the skin once a week. 3 mL 3   Spacer/Aero-Holding Chambers DEVI Use as directed 1 Canister 0   vitamin B-12 (CYANOCOBALAMIN) 1000 MCG tablet Take 1,000 mcg by mouth daily.     No current facility-administered medications on file prior to visit.    Allergies  Allergen Reactions   Penicillins Shortness Of Breath    Has patient had a PCN reaction causing immediate rash, facial/tongue/throat swelling, SOB or lightheadedness with hypotension: Yes Has patient had a PCN reaction causing severe rash involving mucus membranes or skin necrosis: No Has patient had a PCN reaction that required hospitalization: No Has patient had a PCN reaction occurring within the last 10 years: No If all of the above answers are "NO", then may proceed with Cephalosporin use.   REACTION: difficulty breathing   Shellfish Allergy Shortness Of Breath   Amoxicillin-Pot Clavulanate Swelling    Tongue swelling   Atorvastatin     REACTION: hip pain   Bee Venom Swelling    Shakes, chills , vomiting   Cefprozil     REACTION: rash   Demerol Nausea And Vomiting   Dilaudid [Hydromorphone Hcl] Nausea And Vomiting   Doxycycline Other (See Comments)    Severe Abdominal Pain   Meloxicam [Meloxicam] Swelling    Face swelling and lip tingling   Zithromax [Azithromycin]  REACTION: tongue felt funny    Past Medical History:  Diagnosis Date   Allergy    seasonal   Anemia    Anxiety    Asthma    only when gets a cold-expired inhaler   GERD (gastroesophageal reflux disease)    Hyperlipidemia    past hx   Hypertension    PONV (postoperative nausea and vomiting)     Past Surgical History:  Procedure Laterality Date   ABDOMINAL HYSTERECTOMY  07/2006   BREAST BIOPSY Left    BREAST EXCISIONAL BIOPSY Right    BREAST SURGERY  2004   LUMPECTOMY   CARPAL TUNNEL RELEASE  01/2009   LEFT (Dr. Brynda Greathouse)   CESAREAN SECTION     3   COLONOSCOPY  2010   LAPAROSCOPY  04/09/2012   Procedure:  LAPAROSCOPY OPERATIVE;  Surgeon: Reva Bores, MD;  Location: [facility];  Service: Gynecology;  Laterality: N/A;   LUMBAR FUSION  06/2022   L4-L5   NASAL STENOSIS REPAIR  2003   POLYPECTOMY     TA 2010   SALPINGOOPHORECTOMY  04/09/2012   Procedure: SALPINGO OOPHERECTOMY;  Surgeon: Reva Bores, MD;  Location: [facility];  Service: Gynecology;  Laterality: Right;   SHOULDER SURGERY Right 2016   spur on bone, states shoulder dislocated, [place]   TUBAL LIGATION      Family History  Problem Relation Age of Onset   Heart attack Mother    Heart disease Mother    Hypertension Mother    Stroke Mother    Lung cancer Mother        smoked   Allergies Mother    Heart attack Father    Heart disease Father    Hypertension Father    Diabetes Maternal Uncle    Cancer Maternal Grandmother    Heart failure Maternal Grandfather    Heart failure Paternal Grandmother    Colon cancer Neg Hx    Colon polyps Neg Hx    Esophageal cancer Neg Hx    Rectal cancer Neg Hx    Stomach cancer Neg Hx     Social History   Socioeconomic History   Marital status: Married    Spouse name: Not on file   Number of children: 3   Years of education: Not on file   Highest education level: 12th grade  Occupational History   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[place]   Occupation:    Tobacco Use   Smoking status: Never    Passive exposure: Past   Smokeless tobacco: Never  Vaping Use   Vaping status: Never Used  Substance and Sexual Activity   Alcohol use: Yes    Comment: occasionaly, every few months   Drug use: No   Sexual activity: Not on file  Other Topics Concern   Not on file  Social History Narrative   Does walk a little for exercise   Social Determinants of Health   Financial Resource Strain: Low Risk  (02/28/2023)   Overall Financial Resource Strain (CARDIA)    Difficulty of Paying Living Expenses: Not hard at all  Food Insecurity: No Food Insecurity  (02/28/2023)   Hunger Vital Sign    Worried About Running Out of Food in the Last Year: Never true    Ran Out of Food in the Last Year: Never true  Transportation Needs: No Transportation Needs (02/28/2023)   PRAPARE - Administrator, Civil Service (Medical): No  Lack of Transportation (Non-Medical): No  Physical Activity: Unknown (02/28/2023)   Exercise Vital Sign    Days of Exercise per Week: Patient declined    Minutes of Exercise per Session: Not on file  Stress: No Stress Concern Present (02/28/2023)   [employer] of Occupational Health - Occupational Stress Questionnaire    Feeling of Stress : Not at all  Social Connections: Unknown (02/28/2023)   Social Connection and Isolation Panel [NHANES]    Frequency of Communication with Friends and Family: More than three times a week    Frequency of Social Gatherings with Friends and Family: More than three times a week    Attends Religious Services: Patient declined    Database administrator or Organizations: No    Attends Engineer, structural: Not on file    Marital Status: Married  Intimate Partner Violence: Unknown (02/01/2022)   Received from [employer], [facility]   HITS    Physically Hurt: Not on file    Insult or Talk Down To: Not on file    Threaten Physical Harm: Not on file    Scream or Curse: Not on file   Review of Systems No loss of smell or taste No N/V Eating okay    Objective:   Physical Exam Constitutional:      Appearance: Normal appearance.  Pulmonary:     Effort: Pulmonary effort is normal. No respiratory distress.  Neurological:     Mental Status: She is alert.            Assessment & Plan:

## 2023-06-05 NOTE — Assessment & Plan Note (Signed)
May have started with chemical exposure in the school Hard to tell if she has a secondary bacterial infection or not Discussed analgesics Can double her flonase for a week or so If worsens, can take clinida 300 tid x 7 days (will send so it is available if needed)

## 2023-06-06 ENCOUNTER — Telehealth: Payer: Self-pay | Admitting: Internal Medicine

## 2023-06-06 NOTE — Telephone Encounter (Signed)
Patient contacted the office regarding visit from yesterday. States this morning she took a home covid test, it was positive. Patient wanted to know what she is to do regarding medication, says she was given antibiotics yesterday and is assuming now that maybe she should not take them? Patient began symptoms on Tuesday night, has only tested positive today. Patient also asked if Dr. Alphonsus Sias could possibly write a note to excuse her from work?

## 2023-06-06 NOTE — Telephone Encounter (Signed)
Spoke to pt. Letter sent to MyChart for her.

## 2023-06-13 ENCOUNTER — Other Ambulatory Visit: Payer: Self-pay | Admitting: Internal Medicine

## 2023-07-08 ENCOUNTER — Encounter: Payer: Self-pay | Admitting: Internal Medicine

## 2023-07-11 ENCOUNTER — Telehealth: Payer: Self-pay

## 2023-07-11 ENCOUNTER — Other Ambulatory Visit (HOSPITAL_COMMUNITY): Payer: Self-pay

## 2023-07-11 NOTE — Telephone Encounter (Addendum)
Pharmacy Patient Advocate Encounter   Received notification from CoverMyMeds that prior authorization for [facility] 1.7MG /0.75ML is required/requested.   Insurance verification completed.   The patient is insured through [facility] .   Per test claim: PA required; PA started via CoverMyMeds. KEY [id number] . Waiting for clinical questions to populate.    QUESTIONS POPULATED AND ANSWERED.

## 2023-07-17 NOTE — Telephone Encounter (Signed)
Kendra from [facility] called over and stated that the PA was incomplete. She stated that the continuation coverage portion was not completed.

## 2023-07-20 ENCOUNTER — Other Ambulatory Visit: Payer: Self-pay | Admitting: Internal Medicine

## 2023-07-21 ENCOUNTER — Other Ambulatory Visit (HOSPITAL_COMMUNITY): Payer: Self-pay

## 2023-07-22 ENCOUNTER — Other Ambulatory Visit (HOSPITAL_COMMUNITY): Payer: Self-pay

## 2023-07-22 NOTE — Telephone Encounter (Signed)
Yvonne Stephenson "Yvonne Stephenson"  You1 hour ago (2:27 PM)    165 and on Sunday

## 2023-07-22 NOTE — Telephone Encounter (Signed)
Updated weight required for PA, please send pt's most current weight, thanks.

## 2023-07-22 NOTE — Telephone Encounter (Signed)
I have sent a message to the pt asking her to provide [place] with her current weight and the date of that weight.

## 2023-07-23 NOTE — Telephone Encounter (Signed)
Thank you, I have submitted that information to insurance, medication initially received denial, included this information in appeal along with supporting chart notes and screen shot of pt's message. Fingers crossed.

## 2023-07-24 ENCOUNTER — Other Ambulatory Visit (HOSPITAL_COMMUNITY): Payer: Self-pay

## 2023-07-25 ENCOUNTER — Other Ambulatory Visit (HOSPITAL_COMMUNITY): Payer: Self-pay

## 2023-07-25 NOTE — Telephone Encounter (Signed)
Pharmacy Patient Advocate Encounter  Received notification from [facility] that appeal for Reginal Lutes has been APPROVED from 07/11/2023 to 07/10/2024   PA #/Case ID/Reference #: [id number]

## 2023-07-28 DIAGNOSIS — M25512 Pain in left shoulder: Secondary | ICD-10-CM | POA: Diagnosis not present

## 2023-08-04 ENCOUNTER — Encounter: Payer: Self-pay | Admitting: Internal Medicine

## 2023-08-04 ENCOUNTER — Ambulatory Visit: Payer: BC Managed Care – PPO | Admitting: Internal Medicine

## 2023-08-04 VITALS — BP 118/74 | HR 94 | Temp 98.3°F | Ht 60.25 in | Wt 165.0 lb

## 2023-08-04 DIAGNOSIS — R0981 Nasal congestion: Secondary | ICD-10-CM | POA: Insufficient documentation

## 2023-08-04 MED ORDER — PREDNISONE 20 MG PO TABS: 40.0000 mg | ORAL_TABLET | Freq: Every day | ORAL | 0 refills | Status: DC

## 2023-08-04 NOTE — Patient Instructions (Signed)
Please try cetirizine 10mg  or loratadine 20mg  nightly till allergy season is over.

## 2023-08-04 NOTE — Assessment & Plan Note (Signed)
Seems more allergic than infectious Could have GERD component--but doesn't seem to be primary Will try prednisone 40mg  dialy x 3 Add cetirizine 10mg  at bedtime (or loratadine 20) Consider antibiotic if worsens

## 2023-08-04 NOTE — Progress Notes (Signed)
Subjective:    Patient ID: Yvonne Stephenson, female    DOB: 1966/03/26, 57 y.o.   MRN: [id number]  HPI Here due to cough  Got over last infection--but now has 3 weeks of phlegm Clearing her throat all the time Had sense of trouble clearing her chest lying on her side the other day Sense in her chest and couldn't get her breath right 2 days ago (when lying on her left side). Got up and then turned on her other side and was better Tried nebulizer-not clearly helping  Some rhinorrhea in AM---after nasal spray No clear "cold' symptoms No fever  Some acid reflux---takes omeprazole every day Slight cough--mostly to clear the phlegm  Tried mucinex--didn't really help  Current Outpatient Medications on File Prior to Visit  Medication Sig Dispense Refill   albuterol (VENTOLIN HFA) 108 (90 Base) MCG/ACT inhaler INHALE 2 PUFFS BY MOUTH EVERY 6 HOURS AS NEEDED FOR WHEEZE OR SHORTNESS OF BREATH 8.5 each 1   bisoprolol (ZEBETA) 5 MG tablet TAKE 1 TABLET (5 MG TOTAL) BY MOUTH DAILY. 90 tablet 1   clindamycin (CLEOCIN) 300 MG capsule Take 1 capsule (300 mg total) by mouth 3 (three) times daily. 21 capsule 0   ELDERBERRY PO Take by mouth.     EPINEPHRINE 0.3 mg/0.3 mL IJ SOAJ injection INJECT 0.3 MG INTO THE MUSCLE AS NEEDED FOR ANAPHYLAXIS. 2 each 1   fluticasone (FLONASE) 50 MCG/ACT nasal spray SPRAY 2 SPRAYS INTO EACH NOSTRIL EVERY DAY 48 mL 3   FOLIC ACID PO Take by mouth.     LORazepam (ATIVAN) 0.5 MG tablet Take 1 tablet (0.5 mg total) by mouth 2 (two) times daily as needed for anxiety. 30 tablet 0   Multiple Vitamins-Minerals (ZINC PO) Take by mouth.     omeprazole (PRILOSEC) 20 MG capsule TAKE 1 CAPSULE (20 MG TOTAL) BY MOUTH AS NEEDED 90 capsule 3   ondansetron (ZOFRAN-ODT) 4 MG disintegrating tablet TAKE 1 TABLET BY MOUTH EVERY 8 HOURS AS NEEDED FOR NAUSEA AND VOMITING 60 tablet 0   QVAR REDIHALER 40 MCG/ACT inhaler INHALE 2 PUFFS INTO THE LUNGS TWICE A DAY 10.6 g 0   Semaglutide-Weight  Management 1.7 MG/0.75ML SOAJ Inject 1.7 mg into the skin once a week. 3 mL 3   Spacer/Aero-Holding Chambers DEVI Use as directed 1 Canister 0   vitamin B-12 (CYANOCOBALAMIN) 1000 MCG tablet Take 1,000 mcg by mouth daily.     No current facility-administered medications on file prior to visit.    Allergies  Allergen Reactions   Penicillins Shortness Of Breath    Has patient had a PCN reaction causing immediate rash, facial/tongue/throat swelling, SOB or lightheadedness with hypotension: Yes Has patient had a PCN reaction causing severe rash involving mucus membranes or skin necrosis: No Has patient had a PCN reaction that required hospitalization: No Has patient had a PCN reaction occurring within the last 10 years: No If all of the above answers are "NO", then may proceed with Cephalosporin use.   REACTION: difficulty breathing   Shellfish Allergy Shortness Of Breath   Amoxicillin-Pot Clavulanate Swelling    Tongue swelling   Atorvastatin     REACTION: hip pain   Bee Venom Swelling    Shakes, chills , vomiting   Cefprozil     REACTION: rash   Demerol Nausea And Vomiting   Dilaudid [Hydromorphone Hcl] Nausea And Vomiting   Doxycycline Other (See Comments)    Severe Abdominal Pain   Meloxicam [Meloxicam] Swelling  Face swelling and lip tingling   Zithromax [Azithromycin]     REACTION: tongue felt funny    Past Medical History:  Diagnosis Date   Allergy    seasonal   Anemia    Anxiety    Asthma    only when gets a cold-expired inhaler   GERD (gastroesophageal reflux disease)    Hyperlipidemia    past hx   Hypertension    PONV (postoperative nausea and vomiting)     Past Surgical History:  Procedure Laterality Date   ABDOMINAL HYSTERECTOMY  07/2006   BREAST BIOPSY Left    BREAST EXCISIONAL BIOPSY Right    BREAST SURGERY  2004   LUMPECTOMY   CARPAL TUNNEL RELEASE  01/2009   LEFT (Dr. Brynda Greathouse)   CESAREAN SECTION     3   COLONOSCOPY  2010   LAPAROSCOPY   04/09/2012   Procedure: LAPAROSCOPY OPERATIVE;  Surgeon: Reva Bores, MD;  Location: [facility];  Service: Gynecology;  Laterality: N/A;   LUMBAR FUSION  06/2022   L4-L5   NASAL STENOSIS REPAIR  2003   POLYPECTOMY     TA 2010   SALPINGOOPHORECTOMY  04/09/2012   Procedure: SALPINGO OOPHERECTOMY;  Surgeon: Reva Bores, MD;  Location: [facility];  Service: Gynecology;  Laterality: Right;   SHOULDER SURGERY Right 2016   spur on bone, states shoulder dislocated, [place]   TUBAL LIGATION      Family History  Problem Relation Age of Onset   Heart attack Mother    Heart disease Mother    Hypertension Mother    Stroke Mother    Lung cancer Mother        smoked   Allergies Mother    Heart attack Father    Heart disease Father    Hypertension Father    Diabetes Maternal Uncle    Cancer Maternal Grandmother    Heart failure Maternal Grandfather    Heart failure Paternal Grandmother    Colon cancer Neg Hx    Colon polyps Neg Hx    Esophageal cancer Neg Hx    Rectal cancer Neg Hx    Stomach cancer Neg Hx     Social History   Socioeconomic History   Marital status: Married    Spouse name: Not on file   Number of children: 3   Years of education: Not on file   Highest education level: 12th grade  Occupational History   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[place]   Occupation:    Tobacco Use   Smoking status: Never    Passive exposure: Past   Smokeless tobacco: Never  Vaping Use   Vaping status: Never Used  Substance and Sexual Activity   Alcohol use: Yes    Comment: occasionaly, every few months   Drug use: No   Sexual activity: Not on file  Other Topics Concern   Not on file  Social History Narrative   Does walk a little for exercise   Social Determinants of Health   Financial Resource Strain: Low Risk  (02/28/2023)   Overall Financial Resource Strain (CARDIA)    Difficulty of Paying Living Expenses: Not hard at all  Food  Insecurity: No Food Insecurity (02/28/2023)   Hunger Vital Sign    Worried About Running Out of Food in the Last Year: Never true    Ran Out of Food in the Last Year: Never true  Transportation Needs: No Transportation Needs (02/28/2023)  PRAPARE - Administrator, Civil Service (Medical): No    Lack of Transportation (Non-Medical): No  Physical Activity: Unknown (02/28/2023)   Exercise Vital Sign    Days of Exercise per Week: Patient declined    Minutes of Exercise per Session: Not on file  Stress: No Stress Concern Present (02/28/2023)   [employer] of Occupational Health - Occupational Stress Questionnaire    Feeling of Stress : Not at all  Social Connections: Unknown (02/28/2023)   Social Connection and Isolation Panel [NHANES]    Frequency of Communication with Friends and Family: More than three times a week    Frequency of Social Gatherings with Friends and Family: More than three times a week    Attends Religious Services: Patient declined    Database administrator or Organizations: No    Attends Engineer, structural: Not on file    Marital Status: Married  Intimate Partner Violence: Unknown (02/01/2022)   Received from [employer], [facility]   HITS    Physically Hurt: Not on file    Insult or Talk Down To: Not on file    Threaten Physical Harm: Not on file    Scream or Curse: Not on file   Review of Systems Slight frontal headache No hay fever in general     Objective:   Physical Exam Constitutional:      Appearance: Normal appearance.  HENT:     Head:     Comments: No sinus tenderness    Right Ear: Tympanic membrane and ear canal normal.     Left Ear: Tympanic membrane and ear canal normal.     Mouth/Throat:     Pharynx: No oropharyngeal exudate or posterior oropharyngeal erythema.  Pulmonary:     Effort: Pulmonary effort is normal.     Breath sounds: Normal breath sounds. No wheezing or rales.  Musculoskeletal:     Cervical back: Neck  supple.  Lymphadenopathy:     Cervical: No cervical adenopathy.  Neurological:     Mental Status: She is alert.            Assessment & Plan:

## 2023-09-08 ENCOUNTER — Ambulatory Visit: Payer: BC Managed Care – PPO | Admitting: Internal Medicine

## 2023-09-08 ENCOUNTER — Encounter: Payer: Self-pay | Admitting: Internal Medicine

## 2023-09-08 DIAGNOSIS — Z0000 Encounter for general adult medical examination without abnormal findings: Secondary | ICD-10-CM

## 2023-09-08 DIAGNOSIS — Z23 Encounter for immunization: Secondary | ICD-10-CM | POA: Diagnosis not present

## 2023-09-08 MED ORDER — SEMAGLUTIDE-WEIGHT MANAGEMENT 2.4 MG/0.75ML ~~LOC~~ SOAJ: 2.4000 mg | SUBCUTANEOUS | 11 refills | Status: DC

## 2023-09-08 NOTE — Assessment & Plan Note (Signed)
Has responded to the wegovy--but weight loss has plateau and appetite is returning towards the end of the week Will increase to 2.4mg  weekly See back for yearly in June

## 2023-09-08 NOTE — Addendum Note (Signed)
Addended by: Eual Fines on: 09/08/2023 09:52 AM   Modules accepted: Orders

## 2023-09-08 NOTE — Progress Notes (Signed)
Subjective:    Patient ID: Yvonne Stephenson, female    DOB: June 17, 1966, 57 y.o.   MRN: [id number]  HPI Here for follow up of obesity on semaglutide  No problems with the semaglutide Weight loss seems to have stabilized Appetite seems to be coming back by the end of the week Very occasional nausea---can take the ondansetron with relief (usually 2-3 days after injection)  Original weight 205# Stabiilzed in 160's  Current Outpatient Medications on File Prior to Visit  Medication Sig Dispense Refill   albuterol (VENTOLIN HFA) 108 (90 Base) MCG/ACT inhaler INHALE 2 PUFFS BY MOUTH EVERY 6 HOURS AS NEEDED FOR WHEEZE OR SHORTNESS OF BREATH 8.5 each 1   bisoprolol (ZEBETA) 5 MG tablet TAKE 1 TABLET (5 MG TOTAL) BY MOUTH DAILY. 90 tablet 1   ELDERBERRY PO Take by mouth.     EPINEPHRINE 0.3 mg/0.3 mL IJ SOAJ injection INJECT 0.3 MG INTO THE MUSCLE AS NEEDED FOR ANAPHYLAXIS. 2 each 1   fluticasone (FLONASE) 50 MCG/ACT nasal spray SPRAY 2 SPRAYS INTO EACH NOSTRIL EVERY DAY 48 mL 3   FOLIC ACID PO Take by mouth.     LORazepam (ATIVAN) 0.5 MG tablet Take 1 tablet (0.5 mg total) by mouth 2 (two) times daily as needed for anxiety. 30 tablet 0   Multiple Vitamins-Minerals (ZINC PO) Take by mouth.     omeprazole (PRILOSEC) 20 MG capsule TAKE 1 CAPSULE (20 MG TOTAL) BY MOUTH AS NEEDED 90 capsule 3   ondansetron (ZOFRAN-ODT) 4 MG disintegrating tablet TAKE 1 TABLET BY MOUTH EVERY 8 HOURS AS NEEDED FOR NAUSEA AND VOMITING 60 tablet 0   predniSONE (DELTASONE) 20 MG tablet Take 2 tablets (40 mg total) by mouth daily. 6 tablet 0   QVAR REDIHALER 40 MCG/ACT inhaler INHALE 2 PUFFS INTO THE LUNGS TWICE A DAY 10.6 g 0   Semaglutide-Weight Management 1.7 MG/0.75ML SOAJ Inject 1.7 mg into the skin once a week. 3 mL 3   Spacer/Aero-Holding Chambers DEVI Use as directed 1 Canister 0   vitamin B-12 (CYANOCOBALAMIN) 1000 MCG tablet Take 1,000 mcg by mouth daily.     No current facility-administered medications on  file prior to visit.    Allergies  Allergen Reactions   Penicillins Shortness Of Breath    Has patient had a PCN reaction causing immediate rash, facial/tongue/throat swelling, SOB or lightheadedness with hypotension: Yes Has patient had a PCN reaction causing severe rash involving mucus membranes or skin necrosis: No Has patient had a PCN reaction that required hospitalization: No Has patient had a PCN reaction occurring within the last 10 years: No If all of the above answers are "NO", then may proceed with Cephalosporin use.   REACTION: difficulty breathing   Shellfish Allergy Shortness Of Breath   Amoxicillin-Pot Clavulanate Swelling    Tongue swelling   Atorvastatin     REACTION: hip pain   Bee Venom Swelling    Shakes, chills , vomiting   Cefprozil     REACTION: rash   Demerol Nausea And Vomiting   Dilaudid [Hydromorphone Hcl] Nausea And Vomiting   Doxycycline Other (See Comments)    Severe Abdominal Pain   Meloxicam [Meloxicam] Swelling    Face swelling and lip tingling   Zithromax [Azithromycin]     REACTION: tongue felt funny    Past Medical History:  Diagnosis Date   Allergy    seasonal   Anemia    Anxiety    Asthma    only when gets a  cold-expired inhaler   GERD (gastroesophageal reflux disease)    Hyperlipidemia    past hx   Hypertension    PONV (postoperative nausea and vomiting)     Past Surgical History:  Procedure Laterality Date   ABDOMINAL HYSTERECTOMY  07/2006   BREAST BIOPSY Left    BREAST EXCISIONAL BIOPSY Right    BREAST SURGERY  2004   LUMPECTOMY   CARPAL TUNNEL RELEASE  01/2009   LEFT (Dr. Brynda Greathouse)   CESAREAN SECTION     3   COLONOSCOPY  2010   LAPAROSCOPY  04/09/2012   Procedure: LAPAROSCOPY OPERATIVE;  Surgeon: Reva Bores, MD;  Location: [facility];  Service: Gynecology;  Laterality: N/A;   LUMBAR FUSION  06/2022   L4-L5   NASAL STENOSIS REPAIR  2003   POLYPECTOMY     TA 2010   SALPINGOOPHORECTOMY  04/09/2012   Procedure:  SALPINGO OOPHERECTOMY;  Surgeon: Reva Bores, MD;  Location: [facility];  Service: Gynecology;  Laterality: Right;   SHOULDER SURGERY Right 2016   spur on bone, states shoulder dislocated, [place]   TUBAL LIGATION      Family History  Problem Relation Age of Onset   Heart attack Mother    Heart disease Mother    Hypertension Mother    Stroke Mother    Lung cancer Mother        smoked   Allergies Mother    Heart attack Father    Heart disease Father    Hypertension Father    Diabetes Maternal Uncle    Cancer Maternal Grandmother    Heart failure Maternal Grandfather    Heart failure Paternal Grandmother    Colon cancer Neg Hx    Colon polyps Neg Hx    Esophageal cancer Neg Hx    Rectal cancer Neg Hx    Stomach cancer Neg Hx     Social History   Socioeconomic History   Marital status: Married    Spouse name: Not on file   Number of children: 3   Years of education: Not on file   Highest education level: 12th grade  Occupational History   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[place]   Occupation:    Tobacco Use   Smoking status: Never    Passive exposure: Past   Smokeless tobacco: Never  Vaping Use   Vaping status: Never Used  Substance and Sexual Activity   Alcohol use: Yes    Comment: occasionaly, every few months   Drug use: No   Sexual activity: Not on file  Other Topics Concern   Not on file  Social History Narrative   Does walk a little for exercise   Social Determinants of Health   Financial Resource Strain: Low Risk  (09/04/2023)   Overall Financial Resource Strain (CARDIA)    Difficulty of Paying Living Expenses: Not hard at all  Food Insecurity: No Food Insecurity (09/04/2023)   Hunger Vital Sign    Worried About Running Out of Food in the Last Year: Never true    Ran Out of Food in the Last Year: Never true  Transportation Needs: No Transportation Needs (09/04/2023)   PRAPARE - Scientist, research (physical sciences) (Medical): No    Lack of Transportation (Non-Medical): No  Physical Activity: Insufficiently Active (09/04/2023)   Exercise Vital Sign    Days of Exercise per Week: 2 days    Minutes of Exercise per Session: 20 min  Stress: No Stress Concern Present (09/04/2023)   [employer] of Occupational Health - Occupational Stress Questionnaire    Feeling of Stress : Not at all  Social Connections: Moderately Isolated (09/04/2023)   Social Connection and Isolation Panel [NHANES]    Frequency of Communication with Friends and Family: More than three times a week    Frequency of Social Gatherings with Friends and Family: More than three times a week    Attends Religious Services: Never    Database administrator or Organizations: No    Attends Engineer, structural: Not on file    Marital Status: Married  Intimate Partner Violence: Unknown (02/01/2022)   Received from [employer], [facility]   HITS    Physically Hurt: Not on file    Insult or Talk Down To: Not on file    Threaten Physical Harm: Not on file    Scream or Curse: Not on file   Review of Systems Bowels okay with stool softener    Objective:   Physical Exam Constitutional:      Appearance: Normal appearance.  Neurological:     Mental Status: She is alert.  Psychiatric:        Mood and Affect: Mood normal.        Behavior: Behavior normal.            Assessment & Plan:

## 2023-09-11 NOTE — Assessment & Plan Note (Signed)
Has responded to the wegovy--but weight loss has plateau and appetite is returning towards the end of the week Will increase to 2.4mg  weekly See back for yearly in June

## 2023-10-08 ENCOUNTER — Encounter: Payer: Self-pay | Admitting: Internal Medicine

## 2023-10-08 MED ORDER — LORAZEPAM 0.5 MG PO TABS: 0.5000 mg | ORAL_TABLET | Freq: Two times a day (BID) | ORAL | 0 refills | Status: DC | PRN

## 2023-10-25 ENCOUNTER — Other Ambulatory Visit: Payer: Self-pay | Admitting: Internal Medicine

## 2023-11-15 DIAGNOSIS — S93602A Unspecified sprain of left foot, initial encounter: Secondary | ICD-10-CM | POA: Diagnosis not present

## 2023-12-25 ENCOUNTER — Encounter: Payer: Self-pay | Admitting: Internal Medicine

## 2023-12-25 ENCOUNTER — Ambulatory Visit: Payer: BC Managed Care – PPO | Admitting: Internal Medicine

## 2023-12-25 VITALS — BP 128/84 | HR 88 | Temp 98.3°F | Ht 60.25 in | Wt 162.0 lb

## 2023-12-25 DIAGNOSIS — J069 Acute upper respiratory infection, unspecified: Secondary | ICD-10-CM | POA: Diagnosis not present

## 2023-12-25 LAB — POCT INFLUENZA A/B
Influenza A, POC: NEGATIVE
Influenza B, POC: NEGATIVE

## 2023-12-25 NOTE — Addendum Note (Signed)
 Addended by: Eual Fines on: 12/25/2023 12:18 PM   Modules accepted: Orders

## 2023-12-25 NOTE — Progress Notes (Signed)
 Subjective:    Patient ID: Yvonne Stephenson, female    DOB: 1966/10/01, 59 y.o.   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 with the flu  Her illness started 2 days ago Lots of phlegm---only coughs with clearing throat Some fever yesterday---sent home with 100 yesterday No chills, sweats, body aches No sore throat Some clogging in ears Headache before the rest--like 3 days ago  Didn't test for COVID  Taken advil  Current Outpatient Medications on File Prior to Visit  Medication Sig Dispense Refill   albuterol (VENTOLIN HFA) 108 (90 Base) MCG/ACT inhaler INHALE 2 PUFFS BY MOUTH EVERY 6 HOURS AS NEEDED FOR WHEEZE OR SHORTNESS OF BREATH 8.5 each 1   beclomethasone (QVAR REDIHALER) 40 MCG/ACT inhaler INHALE 2 PUFFS INTO THE LUNGS TWICE A DAY 10.6 g 5   bisoprolol (ZEBETA) 5 MG tablet TAKE 1 TABLET (5 MG TOTAL) BY MOUTH DAILY. 90 tablet 3   ELDERBERRY PO Take by mouth.     EPINEPHRINE 0.3 mg/0.3 mL IJ SOAJ injection INJECT 0.3 MG INTO THE MUSCLE AS NEEDED FOR ANAPHYLAXIS. 2 each 1   fluticasone (FLONASE) 50 MCG/ACT nasal spray SPRAY 2 SPRAYS INTO EACH NOSTRIL EVERY DAY 48 mL 3   FOLIC ACID PO Take by mouth.     LORazepam (ATIVAN) 0.5 MG tablet Take 1 tablet (0.5 mg total) by mouth 2 (two) times daily as needed for anxiety. 30 tablet 0   Multiple Vitamins-Minerals (ZINC PO) Take by mouth.     omeprazole (PRILOSEC) 20 MG capsule TAKE 1 CAPSULE (20 MG TOTAL) BY MOUTH AS NEEDED 90 capsule 3   ondansetron (ZOFRAN-ODT) 4 MG disintegrating tablet TAKE 1 TABLET BY MOUTH EVERY 8 HOURS AS NEEDED FOR NAUSEA AND VOMITING 60 tablet 0   predniSONE (DELTASONE) 20 MG tablet Take 2 tablets (40 mg total) by mouth daily. 6 tablet 0   Semaglutide-Weight Management 2.4 MG/0.75ML SOAJ Inject 2.4 mg into the skin once a week. 3 mL 11   Spacer/Aero-Holding Chambers DEVI Use as directed 1 Canister 0   vitamin B-12  (CYANOCOBALAMIN) 1000 MCG tablet Take 1,000 mcg by mouth daily.     No current facility-administered medications on file prior to visit.    Allergies  Allergen Reactions   Penicillins Shortness Of Breath    Has patient had a PCN reaction causing immediate rash, facial/tongue/throat swelling, SOB or lightheadedness with hypotension: Yes Has patient had a PCN reaction causing severe rash involving mucus membranes or skin necrosis: No Has patient had a PCN reaction that required hospitalization: No Has patient had a PCN reaction occurring within the last 10 years: No If all of the above answers are "NO", then may proceed with Cephalosporin use.   REACTION: difficulty breathing   Shellfish Allergy Shortness Of Breath   Amoxicillin-Pot Clavulanate Swelling    Tongue swelling   Atorvastatin     REACTION: hip pain   Bee Venom Swelling    Shakes, chills , vomiting   Cefprozil     REACTION: rash   Demerol Nausea And Vomiting   Dilaudid [Hydromorphone Hcl] Nausea And Vomiting   Doxycycline Other (See Comments)    Severe Abdominal Pain   Meloxicam [Meloxicam] Swelling    Face swelling and lip tingling   Zithromax [Azithromycin]     REACTION: tongue felt funny    Past Medical History:  Diagnosis Date   Allergy  seasonal   Anemia    Anxiety    Asthma    only when gets a cold-expired inhaler   GERD (gastroesophageal reflux disease)    Hyperlipidemia    past hx   Hypertension    PONV (postoperative nausea and vomiting)     Past Surgical History:  Procedure Laterality Date   ABDOMINAL HYSTERECTOMY  07/2006   BREAST BIOPSY Left    BREAST EXCISIONAL BIOPSY Right    BREAST SURGERY  2004   LUMPECTOMY   CARPAL TUNNEL RELEASE  01/2009   LEFT (Dr. Brynda Greathouse)   CESAREAN SECTION     3   COLONOSCOPY  2010   LAPAROSCOPY  04/09/2012   Procedure: LAPAROSCOPY OPERATIVE;  Surgeon: Reva Bores, MD;  Location: [facility];  Service: Gynecology;  Laterality: N/A;   LUMBAR FUSION  06/2022    L4-L5   NASAL STENOSIS REPAIR  2003   POLYPECTOMY     TA 2010   SALPINGOOPHORECTOMY  04/09/2012   Procedure: SALPINGO OOPHERECTOMY;  Surgeon: Reva Bores, MD;  Location: [facility];  Service: Gynecology;  Laterality: Right;   SHOULDER SURGERY Right 2016   spur on bone, states shoulder dislocated, [place]   TUBAL LIGATION      Family History  Problem Relation Age of Onset   Heart attack Mother    Heart disease Mother    Hypertension Mother    Stroke Mother    Lung cancer Mother        smoked   Allergies Mother    Heart attack Father    Heart disease Father    Hypertension Father    Diabetes Maternal Uncle    Cancer Maternal Grandmother    Heart failure Maternal Grandfather    Heart failure Paternal Grandmother    Colon cancer Neg Hx    Colon polyps Neg Hx    Esophageal cancer Neg Hx    Rectal cancer Neg Hx    Stomach cancer Neg Hx     Social History   Socioeconomic History   Marital status: Married    Spouse name: Not on file   Number of children: 3   Years of education: Not on file   Highest education level: 12th grade  Occupational History   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[place]   Occupation:    Tobacco Use   Smoking status: Never    Passive exposure: Past   Smokeless tobacco: Never  Vaping Use   Vaping status: Never Used  Substance and Sexual Activity   Alcohol use: Yes    Comment: occasionaly, every few months   Drug use: No   Sexual activity: Not on file  Other Topics Concern   Not on file  Social History Narrative   Does walk a little for exercise   Social Drivers of Health   Financial Resource Strain: Low Risk  (12/24/2023)   Overall Financial Resource Strain (CARDIA)    Difficulty of Paying Living Expenses: Not hard at all  Food Insecurity: No Food Insecurity (12/24/2023)   Hunger Vital Sign    Worried About Running Out of Food in the Last Year: Never true    Ran Out of Food in the Last Year: Never  true  Transportation Needs: No Transportation Needs (12/24/2023)   PRAPARE - Administrator, Civil Service (Medical): No    Lack of Transportation (Non-Medical): No  Physical Activity: Insufficiently Active (12/24/2023)   Exercise Vital Sign  Days of Exercise per Week: 2 days    Minutes of Exercise per Session: 10 min  Stress: No Stress Concern Present (12/24/2023)   [employer] of Occupational Health - Occupational Stress Questionnaire    Feeling of Stress : Only a little  Social Connections: Moderately Isolated (12/24/2023)   Social Connection and Isolation Panel [NHANES]    Frequency of Communication with Friends and Family: More than three times a week    Frequency of Social Gatherings with Friends and Family: Once a week    Attends Religious Services: Never    Database administrator or Organizations: No    Attends Engineer, structural: Not on file    Marital Status: Married  Intimate Partner Violence: Unknown (02/01/2022)   Received from [employer], [facility]   HITS    Physically Hurt: Not on file    Insult or Talk Down To: Not on file    Threaten Physical Harm: Not on file    Scream or Curse: Not on file   Review of Systems Hasn't taken flu or COVID vaccines  No loss of smell or taste No N/V Eating okay    Objective:   Physical Exam Constitutional:      General: She is not in acute distress.    Appearance: Normal appearance.  HENT:     Head:     Comments: No sinus tenderness    Right Ear: Tympanic membrane and ear canal normal.     Left Ear: Tympanic membrane and ear canal normal.     Mouth/Throat:     Pharynx: No oropharyngeal exudate or posterior oropharyngeal erythema.  Pulmonary:     Effort: Pulmonary effort is normal.     Breath sounds: Normal breath sounds. No wheezing or rales.  Musculoskeletal:     Cervical back: Neck supple.  Lymphadenopathy:     Cervical: No cervical adenopathy.  Neurological:     Mental Status: She is  alert.            Assessment & Plan:

## 2023-12-25 NOTE — Assessment & Plan Note (Signed)
 Flu is negative Could be COVID but wouldn't treat anyway Likely RSV or other virus Discussed supportive care--analgesics, rest If worsens with sinus symptoms next week--would try empiric antibiotic Return to work 3/3

## 2023-12-27 ENCOUNTER — Telehealth: Admitting: Family Medicine

## 2023-12-27 DIAGNOSIS — J019 Acute sinusitis, unspecified: Secondary | ICD-10-CM

## 2023-12-27 DIAGNOSIS — J40 Bronchitis, not specified as acute or chronic: Secondary | ICD-10-CM | POA: Diagnosis not present

## 2023-12-27 DIAGNOSIS — B9689 Other specified bacterial agents as the cause of diseases classified elsewhere: Secondary | ICD-10-CM | POA: Diagnosis not present

## 2023-12-27 MED ORDER — CLINDAMYCIN HCL 300 MG PO CAPS: 300.0000 mg | ORAL_CAPSULE | Freq: Three times a day (TID) | ORAL | 0 refills | Status: DC

## 2023-12-27 MED ORDER — PROMETHAZINE-DM 6.25-15 MG/5ML PO SYRP: 5.0000 mL | ORAL_SOLUTION | Freq: Four times a day (QID) | ORAL | 0 refills | Status: DC | PRN

## 2023-12-27 NOTE — Progress Notes (Signed)
 Virtual Visit Consent   Yvonne Stephenson, you are scheduled for a virtual visit with a [facility] provider today. Just as with appointments in the office, your consent must be obtained to participate. Your consent will be active for this visit and any virtual visit you may have with one of our providers in the next 365 days. If you have a MyChart account, a copy of this consent can be sent to you electronically.  As this is a virtual visit, video technology does not allow for your provider to perform a traditional examination. This may limit your provider's ability to fully assess your condition. If your provider identifies any concerns that need to be evaluated in person or the need to arrange testing (such as labs, EKG, etc.), we will make arrangements to do so. Although advances in technology are sophisticated, we cannot ensure that it will always work on either your end or our end. If the connection with a video visit is poor, the visit may have to be switched to a telephone visit. With either a video or telephone visit, we are not always able to ensure that we have a secure connection.  By engaging in this virtual visit, you consent to the provision of healthcare and authorize for your insurance to be billed (if applicable) for the services provided during this visit. Depending on your insurance coverage, you may receive a charge related to this service.  I need to obtain your verbal consent now. Are you willing to proceed with your visit today? Yvonne Stephenson has provided verbal consent on 12/27/2023 for a virtual visit (video or telephone). Georgana Curio, FNP  Date: 12/27/2023 12:05 PM   Virtual Visit via Video Note   I, Georgana Curio, connected with  Yvonne Stephenson  ([id number], [birth year]) on 12/27/23 at 12:00 PM EST by a video-enabled telemedicine application and verified that I am speaking with the correct person using two identifiers.  Location: Patient: Virtual Visit Location Patient:  Home Provider: Virtual Visit Location Provider: Home Office   I discussed the limitations of evaluation and management by telemedicine and the availability of in person appointments. The patient expressed understanding and agreed to proceed.    History of Present Illness: Yvonne Stephenson is a 58 y.o. who identifies as a female who was assigned female at birth, and is being seen today for sinus pressure and pain with post nasal drainage, cough, no fever. No wheezing. Sx for 5-6 days worsening. [name]  HPI: HPI  Problems:  Patient Active Problem List   Diagnosis Date Noted   Diarrhea 02/28/2023   Morbid obesity (HCC) 11/11/2022   Mild intermittent asthma 08/26/2016   Asthma with acute exacerbation 08/12/2016   Viral URI 01/18/2016   Preventative health care 07/10/2012   Fatty infiltration of liver 01/14/2012   Hypertriglyceridemia 09/10/2007   Mild anxiety 09/10/2007   Essential hypertension, benign 09/10/2007   GERD 09/10/2007    Allergies:  Allergies  Allergen Reactions   Penicillins Shortness Of Breath    Has patient had a PCN reaction causing immediate rash, facial/tongue/throat swelling, SOB or lightheadedness with hypotension: Yes Has patient had a PCN reaction causing severe rash involving mucus membranes or skin necrosis: No Has patient had a PCN reaction that required hospitalization: No Has patient had a PCN reaction occurring within the last 10 years: No If all of the above answers are "NO", then may proceed with Cephalosporin use.   REACTION: difficulty breathing   Shellfish Allergy Shortness Of Breath  Amoxicillin-Pot Clavulanate Swelling    Tongue swelling   Atorvastatin     REACTION: hip pain   Bee Venom Swelling    Shakes, chills , vomiting   Cefprozil     REACTION: rash   Demerol Nausea And Vomiting   Dilaudid [Hydromorphone Hcl] Nausea And Vomiting   Doxycycline Other (See Comments)    Severe Abdominal Pain   Meloxicam [Meloxicam] Swelling    Face  swelling and lip tingling   Zithromax [Azithromycin]     REACTION: tongue felt funny   Medications:  Current Outpatient Medications:    clindamycin (CLEOCIN) 300 MG capsule, Take 1 capsule (300 mg total) by mouth 3 (three) times daily for 10 days., Disp: 30 capsule, Rfl: 0   promethazine-dextromethorphan (PROMETHAZINE-DM) 6.25-15 MG/5ML syrup, Take 5 mLs by mouth 4 (four) times daily as needed for up to 10 days for cough., Disp: 118 mL, Rfl: 0   albuterol (VENTOLIN HFA) 108 (90 Base) MCG/ACT inhaler, INHALE 2 PUFFS BY MOUTH EVERY 6 HOURS AS NEEDED FOR WHEEZE OR SHORTNESS OF BREATH, Disp: 8.5 each, Rfl: 1   beclomethasone (QVAR REDIHALER) 40 MCG/ACT inhaler, INHALE 2 PUFFS INTO THE LUNGS TWICE A DAY, Disp: 10.6 g, Rfl: 5   bisoprolol (ZEBETA) 5 MG tablet, TAKE 1 TABLET (5 MG TOTAL) BY MOUTH DAILY., Disp: 90 tablet, Rfl: 3   ELDERBERRY PO, Take by mouth., Disp: , Rfl:    EPINEPHRINE 0.3 mg/0.3 mL IJ SOAJ injection, INJECT 0.3 MG INTO THE MUSCLE AS NEEDED FOR ANAPHYLAXIS., Disp: 2 each, Rfl: 1   fluticasone (FLONASE) 50 MCG/ACT nasal spray, SPRAY 2 SPRAYS INTO EACH NOSTRIL EVERY DAY, Disp: 48 mL, Rfl: 3   FOLIC ACID PO, Take by mouth., Disp: , Rfl:    LORazepam (ATIVAN) 0.5 MG tablet, Take 1 tablet (0.5 mg total) by mouth 2 (two) times daily as needed for anxiety., Disp: 30 tablet, Rfl: 0   Multiple Vitamins-Minerals (ZINC PO), Take by mouth., Disp: , Rfl:    omeprazole (PRILOSEC) 20 MG capsule, TAKE 1 CAPSULE (20 MG TOTAL) BY MOUTH AS NEEDED, Disp: 90 capsule, Rfl: 3   ondansetron (ZOFRAN-ODT) 4 MG disintegrating tablet, TAKE 1 TABLET BY MOUTH EVERY 8 HOURS AS NEEDED FOR NAUSEA AND VOMITING, Disp: 60 tablet, Rfl: 0   predniSONE (DELTASONE) 20 MG tablet, Take 2 tablets (40 mg total) by mouth daily., Disp: 6 tablet, Rfl: 0   Semaglutide-Weight Management 2.4 MG/0.75ML SOAJ, Inject 2.4 mg into the skin once a week., Disp: 3 mL, Rfl: 11   Spacer/Aero-Holding Chambers DEVI, Use as directed, Disp: 1  Canister, Rfl: 0   vitamin B-12 (CYANOCOBALAMIN) 1000 MCG tablet, Take 1,000 mcg by mouth daily., Disp: , Rfl:   Observations/Objective: Patient is well-developed, well-nourished in no acute distress.  Resting comfortably  at home.  Head is normocephalic, atraumatic.  No labored breathing.  Speech is clear and coherent with logical content.  Patient is alert and oriented at baseline.    Assessment and Plan: 1. Acute bacterial sinusitis (Primary)  2. Bronchitis  Increase fluids, humidifier at night, tylenol or ibuprofen as directed, Urgent care if sx persist or worsen.   Follow Up Instructions: I discussed the assessment and treatment plan with the patient. The patient was provided an opportunity to ask questions and all were answered. The patient agreed with the plan and demonstrated an understanding of the instructions.  A copy of instructions were sent to the patient via MyChart unless otherwise noted below.     The patient was  advised to call back or seek an in-person evaluation if the symptoms worsen or if the condition fails to improve as anticipated.    Georgana Curio, FNP

## 2023-12-27 NOTE — Patient Instructions (Signed)

## 2023-12-29 ENCOUNTER — Ambulatory Visit: Payer: Self-pay | Admitting: Internal Medicine

## 2023-12-29 NOTE — Telephone Encounter (Signed)
 This RN attempted to contact patient for triage x2, busy signal- unable to leave message.

## 2023-12-29 NOTE — Telephone Encounter (Signed)
 I have no idea what this message is about. I would assume it is about her visit from last week and looks like she has an appt tomorrow. Will address whatever we need to tomorrow.

## 2023-12-30 ENCOUNTER — Encounter: Payer: Self-pay | Admitting: Internal Medicine

## 2023-12-30 ENCOUNTER — Ambulatory Visit: Admitting: Internal Medicine

## 2023-12-30 VITALS — BP 104/70 | HR 117 | Temp 98.4°F | Ht 60.25 in | Wt 163.0 lb

## 2023-12-30 DIAGNOSIS — J0140 Acute pansinusitis, unspecified: Secondary | ICD-10-CM | POA: Diagnosis not present

## 2023-12-30 MED ORDER — PREDNISONE 20 MG PO TABS: 40.0000 mg | ORAL_TABLET | Freq: Every day | ORAL | 0 refills | Status: DC

## 2023-12-30 MED ORDER — LEVOFLOXACIN 500 MG PO TABS: 500.0000 mg | ORAL_TABLET | Freq: Every day | ORAL | 0 refills | Status: AC

## 2023-12-30 NOTE — Assessment & Plan Note (Signed)
 Has not responded to clinda---and multiple allergies Discussed trial with levaquin---after discussing black box warning she still wants to try this Prednisone to open things up-- 40mg  x 3, 20 mg x 3 Hopefully RTW on Friday 3/7

## 2023-12-30 NOTE — Progress Notes (Signed)
 Subjective:    Patient ID: Yvonne Stephenson, female    DOB: September 19, 1966, 58 y.o.   MRN: [id number]  HPI Here due to ongoing respiratory symptoms  Started a week ago Seemed viral at first Then virtual visit on Saturday--got clindamycin then No better now--didn't take it today (took 3 days) Some cough---feels rattling in her chest Minimal phlegm Some SOB---and wheezing---especially SOB on stairs  Left ear pain No sore throat Frontal headache--using advil and it helps  Current Outpatient Medications on File Prior to Visit  Medication Sig Dispense Refill   albuterol (VENTOLIN HFA) 108 (90 Base) MCG/ACT inhaler INHALE 2 PUFFS BY MOUTH EVERY 6 HOURS AS NEEDED FOR WHEEZE OR SHORTNESS OF BREATH 8.5 each 1   beclomethasone (QVAR REDIHALER) 40 MCG/ACT inhaler INHALE 2 PUFFS INTO THE LUNGS TWICE A DAY 10.6 g 5   bisoprolol (ZEBETA) 5 MG tablet TAKE 1 TABLET (5 MG TOTAL) BY MOUTH DAILY. 90 tablet 3   clindamycin (CLEOCIN) 300 MG capsule Take 1 capsule (300 mg total) by mouth 3 (three) times daily for 10 days. 30 capsule 0   ELDERBERRY PO Take by mouth.     EPINEPHRINE 0.3 mg/0.3 mL IJ SOAJ injection INJECT 0.3 MG INTO THE MUSCLE AS NEEDED FOR ANAPHYLAXIS. 2 each 1   fluticasone (FLONASE) 50 MCG/ACT nasal spray SPRAY 2 SPRAYS INTO EACH NOSTRIL EVERY DAY 48 mL 3   FOLIC ACID PO Take by mouth.     LORazepam (ATIVAN) 0.5 MG tablet Take 1 tablet (0.5 mg total) by mouth 2 (two) times daily as needed for anxiety. 30 tablet 0   Multiple Vitamins-Minerals (ZINC PO) Take by mouth.     omeprazole (PRILOSEC) 20 MG capsule TAKE 1 CAPSULE (20 MG TOTAL) BY MOUTH AS NEEDED 90 capsule 3   ondansetron (ZOFRAN-ODT) 4 MG disintegrating tablet TAKE 1 TABLET BY MOUTH EVERY 8 HOURS AS NEEDED FOR NAUSEA AND VOMITING 60 tablet 0   Semaglutide-Weight Management 2.4 MG/0.75ML SOAJ Inject 2.4 mg into the skin once a week. 3 mL 11   Spacer/Aero-Holding Chambers DEVI Use as directed 1 Canister 0   vitamin B-12  (CYANOCOBALAMIN) 1000 MCG tablet Take 1,000 mcg by mouth daily.     No current facility-administered medications on file prior to visit.    Allergies  Allergen Reactions   Penicillins Shortness Of Breath    Has patient had a PCN reaction causing immediate rash, facial/tongue/throat swelling, SOB or lightheadedness with hypotension: Yes Has patient had a PCN reaction causing severe rash involving mucus membranes or skin necrosis: No Has patient had a PCN reaction that required hospitalization: No Has patient had a PCN reaction occurring within the last 10 years: No If all of the above answers are "NO", then may proceed with Cephalosporin use.   REACTION: difficulty breathing   Shellfish Allergy Shortness Of Breath   Amoxicillin-Pot Clavulanate Swelling    Tongue swelling   Atorvastatin     REACTION: hip pain   Bee Venom Swelling    Shakes, chills , vomiting   Cefprozil     REACTION: rash   Demerol Nausea And Vomiting   Dilaudid [Hydromorphone Hcl] Nausea And Vomiting   Doxycycline Other (See Comments)    Severe Abdominal Pain   Meloxicam [Meloxicam] Swelling    Face swelling and lip tingling   Zithromax [Azithromycin]     REACTION: tongue felt funny    Past Medical History:  Diagnosis Date   Allergy    seasonal   Anemia  Anxiety    Asthma    only when gets a cold-expired inhaler   GERD (gastroesophageal reflux disease)    Hyperlipidemia    past hx   Hypertension    PONV (postoperative nausea and vomiting)     Past Surgical History:  Procedure Laterality Date   ABDOMINAL HYSTERECTOMY  07/2006   BREAST BIOPSY Left    BREAST EXCISIONAL BIOPSY Right    BREAST SURGERY  2004   LUMPECTOMY   CARPAL TUNNEL RELEASE  01/2009   LEFT (Dr. Brynda Greathouse)   CESAREAN SECTION     3   COLONOSCOPY  2010   LAPAROSCOPY  04/09/2012   Procedure: LAPAROSCOPY OPERATIVE;  Surgeon: Reva Bores, MD;  Location: [facility];  Service: Gynecology;  Laterality: N/A;   LUMBAR FUSION  06/2022    L4-L5   NASAL STENOSIS REPAIR  2003   POLYPECTOMY     TA 2010   SALPINGOOPHORECTOMY  04/09/2012   Procedure: SALPINGO OOPHERECTOMY;  Surgeon: Reva Bores, MD;  Location: [facility];  Service: Gynecology;  Laterality: Right;   SHOULDER SURGERY Right 2016   spur on bone, states shoulder dislocated, [place]   TUBAL LIGATION      Family History  Problem Relation Age of Onset   Heart attack Mother    Heart disease Mother    Hypertension Mother    Stroke Mother    Lung cancer Mother        smoked   Allergies Mother    Heart attack Father    Heart disease Father    Hypertension Father    Diabetes Maternal Uncle    Cancer Maternal Grandmother    Heart failure Maternal Grandfather    Heart failure Paternal Grandmother    Colon cancer Neg Hx    Colon polyps Neg Hx    Esophageal cancer Neg Hx    Rectal cancer Neg Hx    Stomach cancer Neg Hx     Social History   Socioeconomic History   Marital status: Married    Spouse name: Not on file   Number of children: 3   Years of education: Not on file   Highest education level: 12th grade  Occupational History   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[place]   Occupation:    Tobacco Use   Smoking status: Never    Passive exposure: Past   Smokeless tobacco: Never  Vaping Use   Vaping status: Never Used  Substance and Sexual Activity   Alcohol use: Yes    Comment: occasionaly, every few months   Drug use: No   Sexual activity: Not on file  Other Topics Concern   Not on file  Social History Narrative   Does walk a little for exercise   Social Drivers of Health   Financial Resource Strain: Low Risk  (12/24/2023)   Overall Financial Resource Strain (CARDIA)    Difficulty of Paying Living Expenses: Not hard at all  Food Insecurity: No Food Insecurity (12/24/2023)   Hunger Vital Sign    Worried About Running Out of Food in the Last Year: Never true    Ran Out of Food in the Last Year: Never  true  Transportation Needs: No Transportation Needs (12/24/2023)   PRAPARE - Administrator, Civil Service (Medical): No    Lack of Transportation (Non-Medical): No  Physical Activity: Insufficiently Active (12/24/2023)   Exercise Vital Sign    Days of Exercise per Week:  2 days    Minutes of Exercise per Session: 10 min  Stress: No Stress Concern Present (12/24/2023)   [employer] of Occupational Health - Occupational Stress Questionnaire    Feeling of Stress : Only a little  Social Connections: Moderately Isolated (12/24/2023)   Social Connection and Isolation Panel [NHANES]    Frequency of Communication with Friends and Family: More than three times a week    Frequency of Social Gatherings with Friends and Family: Once a week    Attends Religious Services: Never    Database administrator or Organizations: No    Attends Engineer, structural: Not on file    Marital Status: Married  Intimate Partner Violence: Unknown (02/01/2022)   Received from [employer], [facility]   HITS    Physically Hurt: Not on file    Insult or Talk Down To: Not on file    Threaten Physical Harm: Not on file    Scream or Curse: Not on file   Review of Systems No fever      Objective:   Physical Exam Constitutional:      General: She is not in acute distress. HENT:     Head:     Comments: Frontal > maxillary tenderness    Right Ear: Tympanic membrane and ear canal normal.     Left Ear: Tympanic membrane and ear canal normal.     Mouth/Throat:     Pharynx: No oropharyngeal exudate or posterior oropharyngeal erythema.  Pulmonary:     Effort: Pulmonary effort is normal.     Breath sounds: Normal breath sounds. No wheezing or rales.  Musculoskeletal:     Cervical back: Neck supple.  Lymphadenopathy:     Cervical: No cervical adenopathy.  Neurological:     Mental Status: She is alert.            Assessment & Plan:

## 2023-12-31 ENCOUNTER — Other Ambulatory Visit: Payer: Self-pay | Admitting: Internal Medicine

## 2024-01-01 NOTE — Telephone Encounter (Signed)
 Lorazepam last filled 10-08-23 #30 Last OV Acute 12-30-23 Next OV 04-12-24 [facility]

## 2024-01-24 ENCOUNTER — Other Ambulatory Visit: Payer: Self-pay | Admitting: Internal Medicine

## 2024-03-04 DIAGNOSIS — M5416 Radiculopathy, lumbar region: Secondary | ICD-10-CM | POA: Diagnosis not present

## 2024-03-04 DIAGNOSIS — Z6832 Body mass index (BMI) 32.0-32.9, adult: Secondary | ICD-10-CM | POA: Diagnosis not present

## 2024-03-29 ENCOUNTER — Other Ambulatory Visit: Payer: Self-pay | Admitting: Internal Medicine

## 2024-03-29 DIAGNOSIS — Z1231 Encounter for screening mammogram for malignant neoplasm of breast: Secondary | ICD-10-CM

## 2024-04-07 ENCOUNTER — Ambulatory Visit: Payer: Self-pay

## 2024-04-07 NOTE — Telephone Encounter (Signed)
 FYI Only or Action Required?: Action required by provider  Patient was last seen in primary care on 12/30/2023 by Helaine Llanos, MD. Called Nurse Triage reporting Nasal Congestion. Symptoms began several days ago. Interventions attempted: OTC medications: Nyquil. Symptoms are: gradually worsening.  Triage Disposition: See PCP When Office is Open (Within 3 Days)  Patient/caregiver understands and will follow disposition?: YesCopied from CRM [id number]. Topic: Clinical - Red Word Triage >> Apr 07, 2024  9:20 AM Rosamond Comes wrote: Red Word that prompted transfer to Nurse Triage: patient calling in, Sunday stated having wheezing when laying down, has been using inhaler, feels congestion is settling down in chest, gets winded when walking up the stairs. Reason for Disposition  [1] Sinus congestion (pressure, fullness) AND [2] present > 10 days  Answer Assessment - Initial Assessment Questions 1. LOCATION: Where does it hurt?      No pain 2. ONSET: When did the sinus start?  (e.g., hours, days)      4 days ago 3. SEVERITY: How bad is the congestion?   (Scale 1-10; mild, moderate or severe)   - MILD (1-3): doesn't interfere with normal activities    - MODERATE (4-7): interferes with normal activities (e.g., work or school) or awakens from sleep   - SEVERE (8-10): excruciating pain and patient unable to do any normal activities        moderate 4. RECURRENT SYMPTOM: Have you ever had sinus problems before? If Yes, ask: When was the last time? and What happened that time?      Yes but not sure 5. NASAL CONGESTION: Is the nose blocked? If Yes, ask: Can you open it or must you breathe through your mouth?     Na  6. NASAL DISCHARGE: Do you have discharge from your nose? If so ask, What color?     clear 7. FEVER: Do you have a fever? If Yes, ask: What is it, how was it measured, and when did it start?      No fever 8. OTHER SYMPTOMS: Do you have any other symptoms? (e.g., sore  throat, cough, earache, difficulty breathing)     Wheezes while lying on right side, coughing   Pt stated she is using inhaler as needed but feels it is trying to settle in chest. Pt feels SOB while up moving around. Wheezing while lying on right side only. While sitting pt has no problem. Pt take OTC NyQuil and pt stated it helped her sleep.  Protocols used: Sinus Pain or Congestion-A-AH

## 2024-04-07 NOTE — Telephone Encounter (Signed)
 Appointment with Dr. Geralyn Knee 04/08/2024 at 11:20 am.

## 2024-04-08 ENCOUNTER — Ambulatory Visit: Admitting: Family Medicine

## 2024-04-08 VITALS — BP 130/80 | HR 91 | Temp 97.8°F | Ht 60.25 in | Wt 166.1 lb

## 2024-04-08 DIAGNOSIS — J4521 Mild intermittent asthma with (acute) exacerbation: Secondary | ICD-10-CM | POA: Diagnosis not present

## 2024-04-08 MED ORDER — EPINEPHRINE 0.3 MG/0.3ML IJ SOAJ: 0.3000 mg | INTRAMUSCULAR | 0 refills | Status: AC | PRN

## 2024-04-08 MED ORDER — PREDNISONE 20 MG PO TABS: ORAL_TABLET | ORAL | 0 refills | Status: AC

## 2024-04-08 MED ORDER — QVAR REDIHALER 40 MCG/ACT IN AERB: 2.0000 | INHALATION_SPRAY | Freq: Two times a day (BID) | RESPIRATORY_TRACT | 3 refills | Status: AC

## 2024-04-08 MED ORDER — ONDANSETRON 4 MG PO TBDP: 4.0000 mg | ORAL_TABLET | Freq: Three times a day (TID) | ORAL | 3 refills | Status: AC | PRN

## 2024-04-08 NOTE — Progress Notes (Signed)
 Jamerion Cabello T. Wilho Sharpley, MD, CAQ Sports Medicine [facility] [address] [place], [address]  Phone: [phone]  FAX: [phone]  Yvonne Stephenson - 58 y.o. female  MRN [id number]  Date of Birth: [birth year]  Date: 04/08/2024  PCP: Helaine Llanos, MD  Referral: Helaine Llanos, MD  Chief Complaint  Patient presents with   Cough   Wheezing        Shortness of Breath    Hurts to take a deep breath    Subjective:   Yvonne Stephenson is a 2 y.o. very pleasant female patient with Body mass index is 32.18 kg/m. who presents with the following:  Patient does present with some breathing difficulties.  Actually saw her back in 2017 for an asthma flare.  She does use Qvar  daily Albuterol  as needed She has used prednisone  in the past.  She is coughing some and has a cough that is productive of mucus She does not have any fever, chills, sweats No nausea, vomiting, diarrhea No back discomfort, abdominal pain  Review of Systems is noted in the HPI, as appropriate  Objective:   BP 130/80   Pulse 91   Temp 97.8 F (36.6 C) (Temporal)   Ht 5' 0.25 (1.53 m)   Wt 166 lb 2 oz (75.4 kg)   SpO2 97%   BMI 32.18 kg/m    Gen: WDWN, NAD. Globally Non-toxic HEENT: Throat clear, w/o exudate, R TM clear, L TM - good landmarks, No fluid present. rhinnorhea.  MMM Frontal sinuses: NT Max sinuses: NT NECK: Anterior cervical  LAD is absent CV: RRR, No M/G/R, cap refill <2 sec PULM: Breathing comfortably in no respiratory distress.  Scattered mild wheezing with no rhonchi or crackles  Laboratory and Imaging Data:  Assessment and Plan:     ICD-10-CM   1. Mild intermittent asthma with acute exacerbation  J45.21      Acute on chronic asthma with exacerbation.  I am going to give her some prednisone  to take  She also needs a refill on her Qvar   Medication Management during today's office visit: Meds ordered this encounter   Medications   EPINEPHrine  0.3 mg/0.3 mL IJ SOAJ injection    Sig: Inject 0.3 mg into the muscle as needed for anaphylaxis.    Dispense:  2 each    Refill:  0   ondansetron  (ZOFRAN -ODT) 4 MG disintegrating tablet    Sig: Take 1 tablet (4 mg total) by mouth every 8 (eight) hours as needed for nausea or vomiting.    Dispense:  20 tablet    Refill:  3   predniSONE  (DELTASONE ) 20 MG tablet    Sig: 2 tabs po for 4 days, then 1 tab po for 4 days    Dispense:  12 tablet    Refill:  0   beclomethasone (QVAR  REDIHALER) 40 MCG/ACT inhaler    Sig: Inhale 2 puffs into the lungs 2 (two) times daily.    Dispense:  31.8 g    Refill:  3   Medications Discontinued During This Encounter  Medication Reason   predniSONE  (DELTASONE ) 20 MG tablet Completed Course   predniSONE  (DELTASONE ) 20 MG tablet Completed Course   EPINEPHRINE  0.3 mg/0.3 mL IJ SOAJ injection Reorder   beclomethasone (QVAR  REDIHALER) 40 MCG/ACT inhaler Reorder   ondansetron  (ZOFRAN -ODT) 4 MG disintegrating tablet Reorder    Orders placed today for conditions managed today: No orders of the defined types were placed  in this encounter.   Disposition: No follow-ups on file.  Dragon [employer] One speech-to-text software was used for transcription in this dictation.  Possible transcriptional errors can occur using Animal nutritionist.   Signed,  Ranny Bye. Sparsh Callens, MD   Outpatient Encounter Medications as of 04/08/2024  Medication Sig   albuterol  (VENTOLIN  HFA) 108 (90 Base) MCG/ACT inhaler INHALE 2 PUFFS BY MOUTH EVERY 6 HOURS AS NEEDED FOR WHEEZE OR SHORTNESS OF BREATH   bisoprolol  (ZEBETA ) 5 MG tablet TAKE 1 TABLET (5 MG TOTAL) BY MOUTH DAILY.   cyclobenzaprine  (FLEXERIL ) 10 MG tablet Take 10 mg by mouth 3 (three) times daily as needed.   diclofenac (VOLTAREN) 75 MG EC tablet Take 75 mg by mouth 2 (two) times daily as needed for moderate pain (pain score 4-6).   ELDERBERRY PO Take by mouth.   fluticasone  (FLONASE ) 50 MCG/ACT  nasal spray SPRAY 2 SPRAYS INTO EACH NOSTRIL EVERY DAY   FOLIC ACID PO Take by mouth.   LORazepam  (ATIVAN ) 0.5 MG tablet TAKE 1 TABLET BY MOUTH 2 TIMES DAILY AS NEEDED FOR ANXIETY.   Multiple Vitamins-Minerals (ZINC PO) Take by mouth.   omeprazole  (PRILOSEC) 20 MG capsule TAKE 1 CAPSULE (20 MG TOTAL) BY MOUTH AS NEEDED   predniSONE  (DELTASONE ) 20 MG tablet 2 tabs po for 4 days, then 1 tab po for 4 days   Semaglutide -Weight Management 2.4 MG/0.75ML SOAJ Inject 2.4 mg into the skin once a week.   Spacer/Aero-Holding Ismael Maria Use as directed   vitamin B-12 (CYANOCOBALAMIN) 1000 MCG tablet Take 1,000 mcg by mouth daily.   [DISCONTINUED] beclomethasone (QVAR  REDIHALER) 40 MCG/ACT inhaler INHALE 2 PUFFS INTO THE LUNGS TWICE A DAY   [DISCONTINUED] EPINEPHRINE  0.3 mg/0.3 mL IJ SOAJ injection INJECT 0.3 MG INTO THE MUSCLE AS NEEDED FOR ANAPHYLAXIS.   [DISCONTINUED] ondansetron  (ZOFRAN -ODT) 4 MG disintegrating tablet TAKE 1 TABLET BY MOUTH EVERY 8 HOURS AS NEEDED FOR NAUSEA AND VOMITING   beclomethasone (QVAR  REDIHALER) 40 MCG/ACT inhaler Inhale 2 puffs into the lungs 2 (two) times daily.   EPINEPHrine  0.3 mg/0.3 mL IJ SOAJ injection Inject 0.3 mg into the muscle as needed for anaphylaxis.   ondansetron  (ZOFRAN -ODT) 4 MG disintegrating tablet Take 1 tablet (4 mg total) by mouth every 8 (eight) hours as needed for nausea or vomiting.   [DISCONTINUED] predniSONE  (DELTASONE ) 20 MG tablet Take 2 tablets (40 mg total) by mouth daily. For 3 days, then 1 tab daily for 3 days   No facility-administered encounter medications on file as of 04/08/2024.

## 2024-04-12 ENCOUNTER — Encounter: Payer: Self-pay | Admitting: Internal Medicine

## 2024-04-12 ENCOUNTER — Ambulatory Visit (INDEPENDENT_AMBULATORY_CARE_PROVIDER_SITE_OTHER): Payer: BC Managed Care – PPO | Admitting: Internal Medicine

## 2024-04-12 VITALS — BP 138/86 | HR 79 | Temp 98.4°F | Ht 59.5 in | Wt 166.0 lb

## 2024-04-12 DIAGNOSIS — I10 Essential (primary) hypertension: Secondary | ICD-10-CM

## 2024-04-12 DIAGNOSIS — J4521 Mild intermittent asthma with (acute) exacerbation: Secondary | ICD-10-CM

## 2024-04-12 DIAGNOSIS — Z0000 Encounter for general adult medical examination without abnormal findings: Secondary | ICD-10-CM | POA: Diagnosis not present

## 2024-04-12 MED ORDER — LEVOFLOXACIN 500 MG PO TABS: 500.0000 mg | ORAL_TABLET | Freq: Every day | ORAL | 0 refills | Status: AC

## 2024-04-12 NOTE — Assessment & Plan Note (Signed)
 BP Readings from Last 3 Encounters:  04/12/24 138/86  04/08/24 130/80  12/30/23 104/70   Controlled on the bisoprolol  5mg  daily

## 2024-04-12 NOTE — Assessment & Plan Note (Signed)
 Doing well on the wegovy  2.4mg  weekly

## 2024-04-12 NOTE — Addendum Note (Signed)
 Addended by: Curt Dover I on: 04/12/2024 09:47 AM   Modules accepted: Orders

## 2024-04-12 NOTE — Assessment & Plan Note (Signed)
 Not tight now but ongoing chest congestion Will try the levaquin  500mg  daily for 5 days---has done well with this and multiple allergies

## 2024-04-12 NOTE — Assessment & Plan Note (Signed)
 Healthy Discussed fitness efforts Colon due 2032 Mammogram scheduled--yearly No pap due to hyster Prefer no flu/COVID vaccines

## 2024-04-12 NOTE — Progress Notes (Signed)
 Subjective:    Patient ID: Yvonne Stephenson, female    DOB: February 19, 1966, 58 y.o.   MRN: [id number]  HPI Here for physical  Seen last week for respiratory illness Prednisone  helped some but still rattling in chest--finishing out the last of this Some SOB--but improved No fever  Otherwise doing okay  Has lost about 30# on the wegovy  No problems with this--some queasy stomach on day 3--but hasn't needed the ondansetron  Does some yard work and swimming--hard to walk in the heat (though off now for the summer)  Current Outpatient Medications on File Prior to Visit  Medication Sig Dispense Refill   albuterol  (VENTOLIN  HFA) 108 (90 Base) MCG/ACT inhaler INHALE 2 PUFFS BY MOUTH EVERY 6 HOURS AS NEEDED FOR WHEEZE OR SHORTNESS OF BREATH 8.5 each 1   beclomethasone (QVAR  REDIHALER) 40 MCG/ACT inhaler Inhale 2 puffs into the lungs 2 (two) times daily. 31.8 g 3   bisoprolol  (ZEBETA ) 5 MG tablet TAKE 1 TABLET (5 MG TOTAL) BY MOUTH DAILY. 90 tablet 3   cyclobenzaprine  (FLEXERIL ) 10 MG tablet Take 10 mg by mouth 3 (three) times daily as needed.     diclofenac (VOLTAREN) 75 MG EC tablet Take 75 mg by mouth 2 (two) times daily as needed for moderate pain (pain score 4-6).     ELDERBERRY PO Take by mouth.     EPINEPHrine  0.3 mg/0.3 mL IJ SOAJ injection Inject 0.3 mg into the muscle as needed for anaphylaxis. 2 each 0   fluticasone  (FLONASE ) 50 MCG/ACT nasal spray SPRAY 2 SPRAYS INTO EACH NOSTRIL EVERY DAY 48 mL 3   FOLIC ACID PO Take by mouth.     LORazepam  (ATIVAN ) 0.5 MG tablet TAKE 1 TABLET BY MOUTH 2 TIMES DAILY AS NEEDED FOR ANXIETY. 30 tablet 0   Multiple Vitamins-Minerals (ZINC PO) Take by mouth.     omeprazole  (PRILOSEC) 20 MG capsule TAKE 1 CAPSULE (20 MG TOTAL) BY MOUTH AS NEEDED 90 capsule 3   ondansetron  (ZOFRAN -ODT) 4 MG disintegrating tablet Take 1 tablet (4 mg total) by mouth every 8 (eight) hours as needed for nausea or vomiting. 20 tablet 3   predniSONE  (DELTASONE ) 20 MG tablet 2 tabs  po for 4 days, then 1 tab po for 4 days 12 tablet 0   Semaglutide -Weight Management 2.4 MG/0.75ML SOAJ Inject 2.4 mg into the skin once a week. 3 mL 11   Spacer/Aero-Holding Chambers DEVI Use as directed 1 Canister 0   vitamin B-12 (CYANOCOBALAMIN) 1000 MCG tablet Take 1,000 mcg by mouth daily.     No current facility-administered medications on file prior to visit.    Allergies  Allergen Reactions   Penicillins Shortness Of Breath    Has patient had a PCN reaction causing immediate rash, facial/tongue/throat swelling, SOB or lightheadedness with hypotension: Yes Has patient had a PCN reaction causing severe rash involving mucus membranes or skin necrosis: No Has patient had a PCN reaction that required hospitalization: No Has patient had a PCN reaction occurring within the last 10 years: No If all of the above answers are NO, then may proceed with Cephalosporin use.   REACTION: difficulty breathing   Shellfish Allergy Shortness Of Breath   Amoxicillin-Pot Clavulanate Swelling    Tongue swelling   Atorvastatin     REACTION: hip pain   Bee Venom Swelling    Shakes, chills , vomiting   Cefprozil     REACTION: rash   Demerol Nausea And Vomiting   Dilaudid [Hydromorphone Hcl] Nausea And Vomiting  Doxycycline  Other (See Comments)    Severe Abdominal Pain   Meloxicam [Meloxicam] Swelling    Face swelling and lip tingling   Zithromax [Azithromycin]     REACTION: tongue felt funny    Past Medical History:  Diagnosis Date   Allergy    seasonal   Anemia    Anxiety    Asthma    only when gets a cold-expired inhaler   GERD (gastroesophageal reflux disease)    Hyperlipidemia    past hx   Hypertension    PONV (postoperative nausea and vomiting)     Past Surgical History:  Procedure Laterality Date   ABDOMINAL HYSTERECTOMY  07/2006   BREAST BIOPSY Left    BREAST EXCISIONAL BIOPSY Right    BREAST SURGERY  2004   LUMPECTOMY   CARPAL TUNNEL RELEASE  01/2009   LEFT (Dr.  Marney Sinning)   CESAREAN SECTION     3   COLONOSCOPY  2010   LAPAROSCOPY  04/09/2012   Procedure: LAPAROSCOPY OPERATIVE;  Surgeon: Granville Layer, MD;  Location: [facility];  Service: Gynecology;  Laterality: N/A;   LUMBAR FUSION  06/2022   L4-L5   NASAL STENOSIS REPAIR  2003   POLYPECTOMY     TA 2010   SALPINGOOPHORECTOMY  04/09/2012   Procedure: SALPINGO OOPHERECTOMY;  Surgeon: Granville Layer, MD;  Location: [facility];  Service: Gynecology;  Laterality: Right;   SHOULDER SURGERY Right 2016   spur on bone, states shoulder dislocated, [place]   TUBAL LIGATION      Family History  Problem Relation Age of Onset   Heart attack Mother    Heart disease Mother    Hypertension Mother    Stroke Mother    Lung cancer Mother        smoked   Allergies Mother    Heart attack Father    Heart disease Father    Hypertension Father    Diabetes Maternal Uncle    Cancer Maternal Grandmother    Heart failure Maternal Grandfather    Heart failure Paternal Grandmother    Colon cancer Neg Hx    Colon polyps Neg Hx    Esophageal cancer Neg Hx    Rectal cancer Neg Hx    Stomach cancer Neg Hx     Social History   Socioeconomic History   Marital status: Married    Spouse name: Not on file   Number of children: 3   Years of education: Not on file   Highest education level: 12th grade  Occupational History   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[place]   Occupation:    Tobacco Use   Smoking status: Never    Passive exposure: Past   Smokeless tobacco: Never  Vaping Use   Vaping status: Never Used  Substance and Sexual Activity   Alcohol use: Yes    Comment: occasionaly, every few months   Drug use: No   Sexual activity: Not on file  Other Topics Concern   Not on file  Social History Narrative   Does walk a little for exercise   Social Drivers of Health   Financial Resource Strain: Low Risk  (04/08/2024)   Overall Financial Resource Strain (CARDIA)     Difficulty of Paying Living Expenses: Not hard at all  Food Insecurity: No Food Insecurity (04/08/2024)   Hunger Vital Sign    Worried About Running Out of Food in the Last Year: Never true  Ran Out of Food in the Last Year: Never true  Transportation Needs: No Transportation Needs (04/08/2024)   PRAPARE - Administrator, Civil Service (Medical): No    Lack of Transportation (Non-Medical): No  Physical Activity: Insufficiently Active (04/08/2024)   Exercise Vital Sign    Days of Exercise per Week: 2 days    Minutes of Exercise per Session: 10 min  Stress: No Stress Concern Present (04/08/2024)   [employer] of Occupational Health - Occupational Stress Questionnaire    Feeling of Stress: Not at all  Social Connections: Moderately Isolated (04/08/2024)   Social Connection and Isolation Panel    Frequency of Communication with Friends and Family: More than three times a week    Frequency of Social Gatherings with Friends and Family: More than three times a week    Attends Religious Services: Never    Database administrator or Organizations: No    Attends Engineer, structural: Not on file    Marital Status: Married  Intimate Partner Violence: Unknown (02/01/2022)   Received from [facility]   HITS    Physically Hurt: Not on file    Insult or Talk Down To: Not on file    Threaten Physical Harm: Not on file    Scream or Curse: Not on file   Review of Systems  Constitutional:  Negative for fatigue.       Wears seat belt  HENT:  Negative for dental problem, hearing loss and tinnitus.        Sees dentist   Eyes:  Negative for visual disturbance.       No diplopia or unilateral vision loss  Respiratory:  Positive for cough. Negative for chest tightness.   Cardiovascular:  Negative for chest pain and leg swelling.       Slight palpitations with prednisone  or illness  Gastrointestinal:  Negative for blood in stool and constipation.       No heartburn  Endocrine:  Negative for polydipsia and polyuria.  Genitourinary:  Negative for dyspareunia, dysuria and hematuria.  Musculoskeletal:  Negative for arthralgias, back pain and joint swelling.  Skin:  Negative for rash.       No suspicious lesions  Allergic/Immunologic: Negative for environmental allergies and immunocompromised state.  Neurological:  Negative for dizziness, syncope and light-headedness.       Recent headaches--with respiratory illness  Hematological:  Negative for adenopathy. Does not bruise/bleed easily.  Psychiatric/Behavioral:  Negative for dysphoric mood. The patient is not nervous/anxious.        Sleep is variable---light       Objective:   Physical Exam Constitutional:      Appearance: Normal appearance.  HENT:     Mouth/Throat:     Pharynx: No oropharyngeal exudate or posterior oropharyngeal erythema.   Eyes:     Conjunctiva/sclera: Conjunctivae normal.     Pupils: Pupils are equal, round, and reactive to light.    Cardiovascular:     Rate and Rhythm: Normal rate and regular rhythm.     Pulses: Normal pulses.     Heart sounds: No murmur heard.    No gallop.  Pulmonary:     Effort: Pulmonary effort is normal.     Breath sounds: Normal breath sounds. No wheezing or rales.  Abdominal:     Palpations: Abdomen is soft.     Tenderness: There is no abdominal tenderness.   Musculoskeletal:     Cervical back: Neck supple.  Right lower leg: No edema.     Left lower leg: No edema.  Lymphadenopathy:     Cervical: No cervical adenopathy.   Skin:    Findings: No rash.   Neurological:     General: No focal deficit present.     Mental Status: She is alert and oriented to person, place, and time.   Psychiatric:        Mood and Affect: Mood normal.        Behavior: Behavior normal.            Assessment & Plan:

## 2024-04-13 ENCOUNTER — Ambulatory Visit: Payer: Self-pay | Admitting: Internal Medicine

## 2024-04-13 LAB — COMPREHENSIVE METABOLIC PANEL WITH GFR
AG Ratio: 1.5 (calc) (ref 1.0–2.5)
ALT: 23 U/L (ref 6–29)
AST: 16 U/L (ref 10–35)
Albumin: 4.3 g/dL (ref 3.6–5.1)
Alkaline phosphatase (APISO): 96 U/L (ref 37–153)
BUN: 17 mg/dL (ref 7–25)
CO2: 27 mmol/L (ref 20–32)
Calcium: 9.9 mg/dL (ref 8.6–10.4)
Chloride: 104 mmol/L (ref 98–110)
Creat: 0.71 mg/dL (ref 0.50–1.03)
Globulin: 2.8 g/dL (ref 1.9–3.7)
Glucose, Bld: 84 mg/dL (ref 65–99)
Potassium: 5.1 mmol/L (ref 3.5–5.3)
Sodium: 141 mmol/L (ref 135–146)
Total Bilirubin: 0.3 mg/dL (ref 0.2–1.2)
Total Protein: 7.1 g/dL (ref 6.1–8.1)
eGFR: 98 mL/min/{1.73_m2} (ref >=60)

## 2024-04-13 LAB — LIPID PANEL
Cholesterol: 242 mg/dL — ABNORMAL HIGH (ref <200)
HDL: 59 mg/dL (ref >=50)
LDL Cholesterol (Calc): 146 mg/dL — ABNORMAL HIGH
Non-HDL Cholesterol (Calc): 183 mg/dL — ABNORMAL HIGH (ref <130)
Total CHOL/HDL Ratio: 4.1 (calc) (ref <5.0)
Triglycerides: 231 mg/dL — ABNORMAL HIGH (ref <150)

## 2024-04-13 LAB — CBC
HCT: 44.6 % (ref 35.0–45.0)
Hemoglobin: 14.2 g/dL (ref 11.7–15.5)
MCH: 26.7 pg — ABNORMAL LOW (ref 27.0–33.0)
MCHC: 31.8 g/dL — ABNORMAL LOW (ref 32.0–36.0)
MCV: 84 fL (ref 80.0–100.0)
MPV: 11.1 fL (ref 7.5–12.5)
Platelets: 158 10*3/uL (ref 140–400)
RBC: 5.31 10*6/uL — ABNORMAL HIGH (ref 3.80–5.10)
RDW: 13.3 % (ref 11.0–15.0)
WBC: 7.8 10*3/uL (ref 3.8–10.8)

## 2024-04-25 ENCOUNTER — Other Ambulatory Visit: Payer: Self-pay | Admitting: Internal Medicine

## 2024-04-27 ENCOUNTER — Encounter: Payer: Self-pay | Admitting: Internal Medicine

## 2024-05-05 ENCOUNTER — Ambulatory Visit
Admission: RE | Admit: 2024-05-05 | Discharge: 2024-05-05 | Disposition: A | Discharge status: Home / Self Care | Source: Ambulatory Visit | Attending: Internal Medicine | Admitting: Internal Medicine

## 2024-05-05 DIAGNOSIS — Z1231 Encounter for screening mammogram for malignant neoplasm of breast: Secondary | ICD-10-CM

## 2024-05-30 ENCOUNTER — Other Ambulatory Visit: Payer: Self-pay | Admitting: Internal Medicine

## 2024-05-31 ENCOUNTER — Other Ambulatory Visit: Payer: Self-pay | Admitting: Internal Medicine

## 2024-05-31 MED ORDER — WEGOVY 2.4 MG/0.75ML ~~LOC~~ SOAJ: 2.4000 mg | SUBCUTANEOUS | 11 refills | Status: AC

## 2024-05-31 MED ORDER — WEGOVY 2.4 MG/0.75ML ~~LOC~~ SOAJ: 2.4000 mg | SUBCUTANEOUS | 11 refills | Status: DC

## 2024-05-31 NOTE — Addendum Note (Signed)
 Addended by: KALLIE CLOTILDA SQUIBB on: 05/31/2024 11:59 AM   Modules accepted: Orders

## 2024-07-01 DIAGNOSIS — M5416 Radiculopathy, lumbar region: Secondary | ICD-10-CM | POA: Diagnosis not present

## 2024-07-05 ENCOUNTER — Encounter: Payer: Self-pay | Admitting: Student

## 2024-07-05 ENCOUNTER — Other Ambulatory Visit: Payer: Self-pay | Admitting: Student

## 2024-07-05 DIAGNOSIS — M5416 Radiculopathy, lumbar region: Secondary | ICD-10-CM

## 2024-07-17 ENCOUNTER — Ambulatory Visit
Admission: RE | Admit: 2024-07-17 | Discharge: 2024-07-17 | Disposition: A | Discharge status: Home / Self Care | Source: Ambulatory Visit | Attending: Student | Admitting: Student

## 2024-07-17 DIAGNOSIS — M5416 Radiculopathy, lumbar region: Secondary | ICD-10-CM

## 2024-07-27 ENCOUNTER — Other Ambulatory Visit (HOSPITAL_COMMUNITY): Payer: Self-pay

## 2024-07-28 DIAGNOSIS — M5416 Radiculopathy, lumbar region: Secondary | ICD-10-CM | POA: Diagnosis not present

## 2024-07-29 ENCOUNTER — Other Ambulatory Visit: Payer: Self-pay

## 2024-07-29 MED ORDER — WEGOVY 2.4 MG/0.75ML ~~LOC~~ SOAJ: 2.4000 mg | SUBCUTANEOUS | 11 refills | Status: AC

## 2024-07-29 NOTE — Telephone Encounter (Unsigned)
 Copied from CRM [id number]. Topic: Clinical - Medication Refill >> Jul 29, 2024  1:42 PM [place] A wrote: Medication: Semaglutide -Weight Management (WEGOVY ) 2.4 MG/0.75ML SOAJ  Has the patient contacted their pharmacy? Yes (Agent: If no, request that the patient contact the pharmacy for the refill. If patient does not wish to contact the pharmacy document the reason why and proceed with request.) (Agent: If yes, when and what did the pharmacy advise?) Needs Prior Auth  This is the patient's preferred pharmacy:  [phone] [place] - [address] [place] [address] [address] Phone: [phone] Fax: [phone]   Is this the correct pharmacy for this prescription? Yes If no, delete pharmacy and type the correct one.   Has the prescription been filled recently? No  Is the patient out of the medication? Yes  Has the patient been seen for an appointment in the last year OR does the patient have an upcoming appointment? Yes  Can we respond through MyChart? Yes  Agent: Please be advised that Rx refills may take up to 3 business days. We ask that you follow-up with your pharmacy.

## 2024-08-02 ENCOUNTER — Other Ambulatory Visit (HOSPITAL_COMMUNITY): Payer: Self-pay

## 2024-08-02 ENCOUNTER — Telehealth: Payer: Self-pay

## 2024-08-02 NOTE — Telephone Encounter (Signed)
 Pharmacy Patient Advocate Encounter   Received notification from Pt Calls Messages that prior authorization for Wegovy  2.4MG /0.75ML auto-injectors is required/requested.   Insurance verification completed.   The patient is insured through [facility].   Per test claim: PA required; PA submitted to above mentioned insurance via Latent Key/confirmation #/EOC [id number] Status is pending

## 2024-08-02 NOTE — Telephone Encounter (Signed)
 Called and spoke to pt. States it needs a new PA. LAst one expired.

## 2024-08-02 NOTE — Telephone Encounter (Signed)
 PA request has been Submitted. New Encounter has been or will be created for follow up. For additional info see Pharmacy Prior Auth telephone encounter from 08/02/24.

## 2024-08-02 NOTE — Telephone Encounter (Signed)
 Copied from CRM [id number]. Topic: Clinical - Prescription Issue >> Aug 02, 2024  3:01 PM Alfonso ORN wrote: Reason for CRM: pcp [place] filled script for WEGOVY  prior to retiring. There is a second script for this rx now and pharmacy advised pcp needs to contact insurance to verify which prescription to approve.  PT contact number: [phone]

## 2024-08-03 ENCOUNTER — Other Ambulatory Visit (HOSPITAL_COMMUNITY): Payer: Self-pay

## 2024-08-03 NOTE — Telephone Encounter (Signed)
 Pharmacy Patient Advocate Encounter  Received notification from [facility] that Prior Authorization for Wegovy  2.4MG /0.75ML auto-injectors has been APPROVED from 08/03/24 to 08/02/25. Ran test claim, Copay is $24.99. This test claim was processed through [facility]- copay amounts may vary at other pharmacies due to pharmacy/plan contracts, or as the patient moves through the different stages of their insurance plan.   PA #/Case ID/Reference #: [id number]

## 2024-09-14 DIAGNOSIS — Z6831 Body mass index (BMI) 31.0-31.9, adult: Secondary | ICD-10-CM | POA: Diagnosis not present

## 2024-09-14 DIAGNOSIS — M5416 Radiculopathy, lumbar region: Secondary | ICD-10-CM | POA: Diagnosis not present

## 2024-10-01 ENCOUNTER — Telehealth: Payer: Self-pay

## 2024-10-01 DIAGNOSIS — F419 Anxiety disorder, unspecified: Secondary | ICD-10-CM

## 2024-10-01 DIAGNOSIS — Z79899 Other long term (current) drug therapy: Secondary | ICD-10-CM

## 2024-10-01 MED ORDER — LORAZEPAM 0.5 MG PO TABS: 0.5000 mg | ORAL_TABLET | Freq: Two times a day (BID) | ORAL | 0 refills | Status: AC | PRN

## 2024-10-01 NOTE — Addendum Note (Signed)
 Addended by: CORWIN ANTU on: 10/01/2024 11:22 AM   Modules accepted: Orders

## 2024-10-01 NOTE — Telephone Encounter (Signed)
 Copied from CRM [id number]. Topic: Clinical - Medication Refill >> Oct 01, 2024 10:10 AM Alfonso HERO wrote: Medication: LORazepam  (ATIVAN ) 0.5 MG tablet  Has the patient contacted their pharmacy? Yes (Agent: If no, request that the patient contact the pharmacy for the refill. If patient does not wish to contact the pharmacy document the reason why and proceed with request.) (Agent: If yes, when and what did the pharmacy advise?)  This is the patient's preferred pharmacy:  [id number] [place] - [address] [place] [address] Phone: [phone] Fax: [phone]   Is this the correct pharmacy for this prescription? Yes If no, delete pharmacy and type the correct one.   Has the prescription been filled recently? Yes  Is the patient out of the medication? Yes  Has the patient been seen for an appointment in the last year OR does the patient have an upcoming appointment? Yes  Can we respond through MyChart? Yes  Agent: Please be advised that Rx refills may take up to 3 business days. We ask that you follow-up with your pharmacy.

## 2024-10-19 DIAGNOSIS — M25551 Pain in right hip: Secondary | ICD-10-CM | POA: Diagnosis not present

## 2024-11-02 DIAGNOSIS — Z23 Encounter for immunization: Secondary | ICD-10-CM

## 2024-11-02 NOTE — Progress Notes (Signed)
 Patient is in office today for a nurse visit for Immunization. Patient Injection was given in the  Right deltoid. Patient tolerated injection well.

## 2024-11-08 MED ORDER — BISOPROLOL FUMARATE 5 MG PO TABS: 5.0000 mg | ORAL_TABLET | Freq: Every day | ORAL | 0 refills | Status: AC

## 2024-11-08 NOTE — Telephone Encounter (Signed)
 Ok to refill until cpe or do you need to have TOC before that date?

## 2024-11-29 ENCOUNTER — Encounter

## 2025-01-27 ENCOUNTER — Encounter

## 2025-04-13 ENCOUNTER — Encounter
